# Patient Record
Sex: Male | Born: 1950 | Race: Black or African American | Hispanic: No | Smoking: Never smoker
Health system: Southern US, Community
[De-identification: ages and names within clinical notes are randomized; demographics above are authoritative.]

## PROBLEM LIST (undated history)

## (undated) DIAGNOSIS — R569 Unspecified convulsions: Secondary | ICD-10-CM

## (undated) DIAGNOSIS — E785 Hyperlipidemia, unspecified: Secondary | ICD-10-CM

---

## 2020-09-09 DIAGNOSIS — I779 Disorder of arteries and arterioles, unspecified: Secondary | ICD-10-CM

## 2020-09-09 DIAGNOSIS — I429 Cardiomyopathy, unspecified: Secondary | ICD-10-CM

## 2020-09-09 DIAGNOSIS — I639 Cerebral infarction, unspecified: Secondary | ICD-10-CM

## 2020-09-09 HISTORY — DX: Cardiomyopathy, unspecified: I42.9

## 2020-09-09 HISTORY — DX: Cerebral infarction, unspecified: I63.9

## 2020-09-09 HISTORY — DX: Disorder of arteries and arterioles, unspecified: I77.9

## 2020-10-18 HISTORY — PX: CAROTID ENDARTERECTOMY: SUR193

## 2020-11-25 ENCOUNTER — Emergency Department (HOSPITAL_COMMUNITY): Payer: Medicare Other

## 2020-11-25 ENCOUNTER — Emergency Department (HOSPITAL_COMMUNITY)
Admission: EM | Admit: 2020-11-25 | Discharge: 2020-11-25 | Disposition: A | Discharge status: Home / Self Care | Payer: Medicare Other | Attending: Emergency Medicine | Admitting: Emergency Medicine

## 2020-11-25 ENCOUNTER — Other Ambulatory Visit: Payer: Self-pay

## 2020-11-25 ENCOUNTER — Encounter (HOSPITAL_COMMUNITY): Payer: Self-pay | Admitting: Emergency Medicine

## 2020-11-25 DIAGNOSIS — I2510 Atherosclerotic heart disease of native coronary artery without angina pectoris: Secondary | ICD-10-CM | POA: Insufficient documentation

## 2020-11-25 DIAGNOSIS — R410 Disorientation, unspecified: Secondary | ICD-10-CM | POA: Diagnosis not present

## 2020-11-25 DIAGNOSIS — I700 Atherosclerosis of aorta: Secondary | ICD-10-CM | POA: Diagnosis not present

## 2020-11-25 DIAGNOSIS — N3000 Acute cystitis without hematuria: Secondary | ICD-10-CM | POA: Diagnosis not present

## 2020-11-25 DIAGNOSIS — I6782 Cerebral ischemia: Secondary | ICD-10-CM | POA: Diagnosis not present

## 2020-11-25 DIAGNOSIS — Z20822 Contact with and (suspected) exposure to covid-19: Secondary | ICD-10-CM | POA: Insufficient documentation

## 2020-11-25 DIAGNOSIS — R82998 Other abnormal findings in urine: Secondary | ICD-10-CM | POA: Diagnosis present

## 2020-11-25 LAB — BASIC METABOLIC PANEL
Anion gap: 12 (ref 5–15)
BUN: 10 mg/dL (ref 8–23)
CO2: 21 mmol/L — ABNORMAL LOW (ref 22–32)
Calcium: 8.6 mg/dL — ABNORMAL LOW (ref 8.9–10.3)
Chloride: 96 mmol/L — ABNORMAL LOW (ref 98–111)
Creatinine, Ser: 0.83 mg/dL (ref 0.61–1.24)
GFR, Estimated: >60 mL/min (ref >60)
Glucose, Bld: 90 mg/dL (ref 70–99)
Potassium: 3.4 mmol/L — ABNORMAL LOW (ref 3.5–5.1)
Sodium: 129 mmol/L — ABNORMAL LOW (ref 135–145)

## 2020-11-25 LAB — URINALYSIS, ROUTINE W REFLEX MICROSCOPIC
Bilirubin Urine: NEGATIVE
Glucose, UA: NEGATIVE mg/dL
Hgb urine dipstick: NEGATIVE
Ketones, ur: NEGATIVE mg/dL
Nitrite: NEGATIVE
Protein, ur: 100 mg/dL — AB
Specific Gravity, Urine: 1.014 (ref 1.005–1.030)
WBC, UA: >50 WBC/hpf — ABNORMAL HIGH (ref 0–5)
pH: 6 (ref 5.0–8.0)

## 2020-11-25 LAB — HEPATIC FUNCTION PANEL
ALT: 12 U/L (ref 0–44)
AST: 36 U/L (ref 15–41)
Albumin: 2.4 g/dL — ABNORMAL LOW (ref 3.5–5.0)
Alkaline Phosphatase: 111 U/L (ref 38–126)
Bilirubin, Direct: 0.3 mg/dL — ABNORMAL HIGH (ref 0.0–0.2)
Indirect Bilirubin: 0.8 mg/dL (ref 0.3–0.9)
Total Bilirubin: 1.1 mg/dL (ref 0.3–1.2)
Total Protein: 7.1 g/dL (ref 6.5–8.1)

## 2020-11-25 LAB — CBC
HCT: 26.1 % — ABNORMAL LOW (ref 39.0–52.0)
Hemoglobin: 8.2 g/dL — ABNORMAL LOW (ref 13.0–17.0)
MCH: 29.5 pg (ref 26.0–34.0)
MCHC: 31.4 g/dL (ref 30.0–36.0)
MCV: 93.9 fL (ref 80.0–100.0)
Platelets: 253 10*3/uL (ref 150–400)
RBC: 2.78 MIL/uL — ABNORMAL LOW (ref 4.22–5.81)
RDW: 20.5 % — ABNORMAL HIGH (ref 11.5–15.5)
WBC: 14 10*3/uL — ABNORMAL HIGH (ref 4.0–10.5)
nRBC: 0.1 % (ref 0.0–0.2)

## 2020-11-25 LAB — RESP PANEL BY RT-PCR (FLU A&B, COVID) ARPGX2
Influenza A by PCR: NEGATIVE
Influenza B by PCR: NEGATIVE
SARS Coronavirus 2 by RT PCR: NEGATIVE

## 2020-11-25 LAB — CBG MONITORING, ED: Glucose-Capillary: 75 mg/dL (ref 70–99)

## 2020-11-25 LAB — LIPASE, BLOOD: Lipase: 14 U/L (ref 11–51)

## 2020-11-25 IMAGING — DX DG CHEST 1V PORT
1 series · 1 of 1 positions shown · non-contrast
Comparison: None.

CLINICAL DATA: Increasing weakness syncope is

EXAM:
PORTABLE CHEST 1 VIEW

[chest ap]
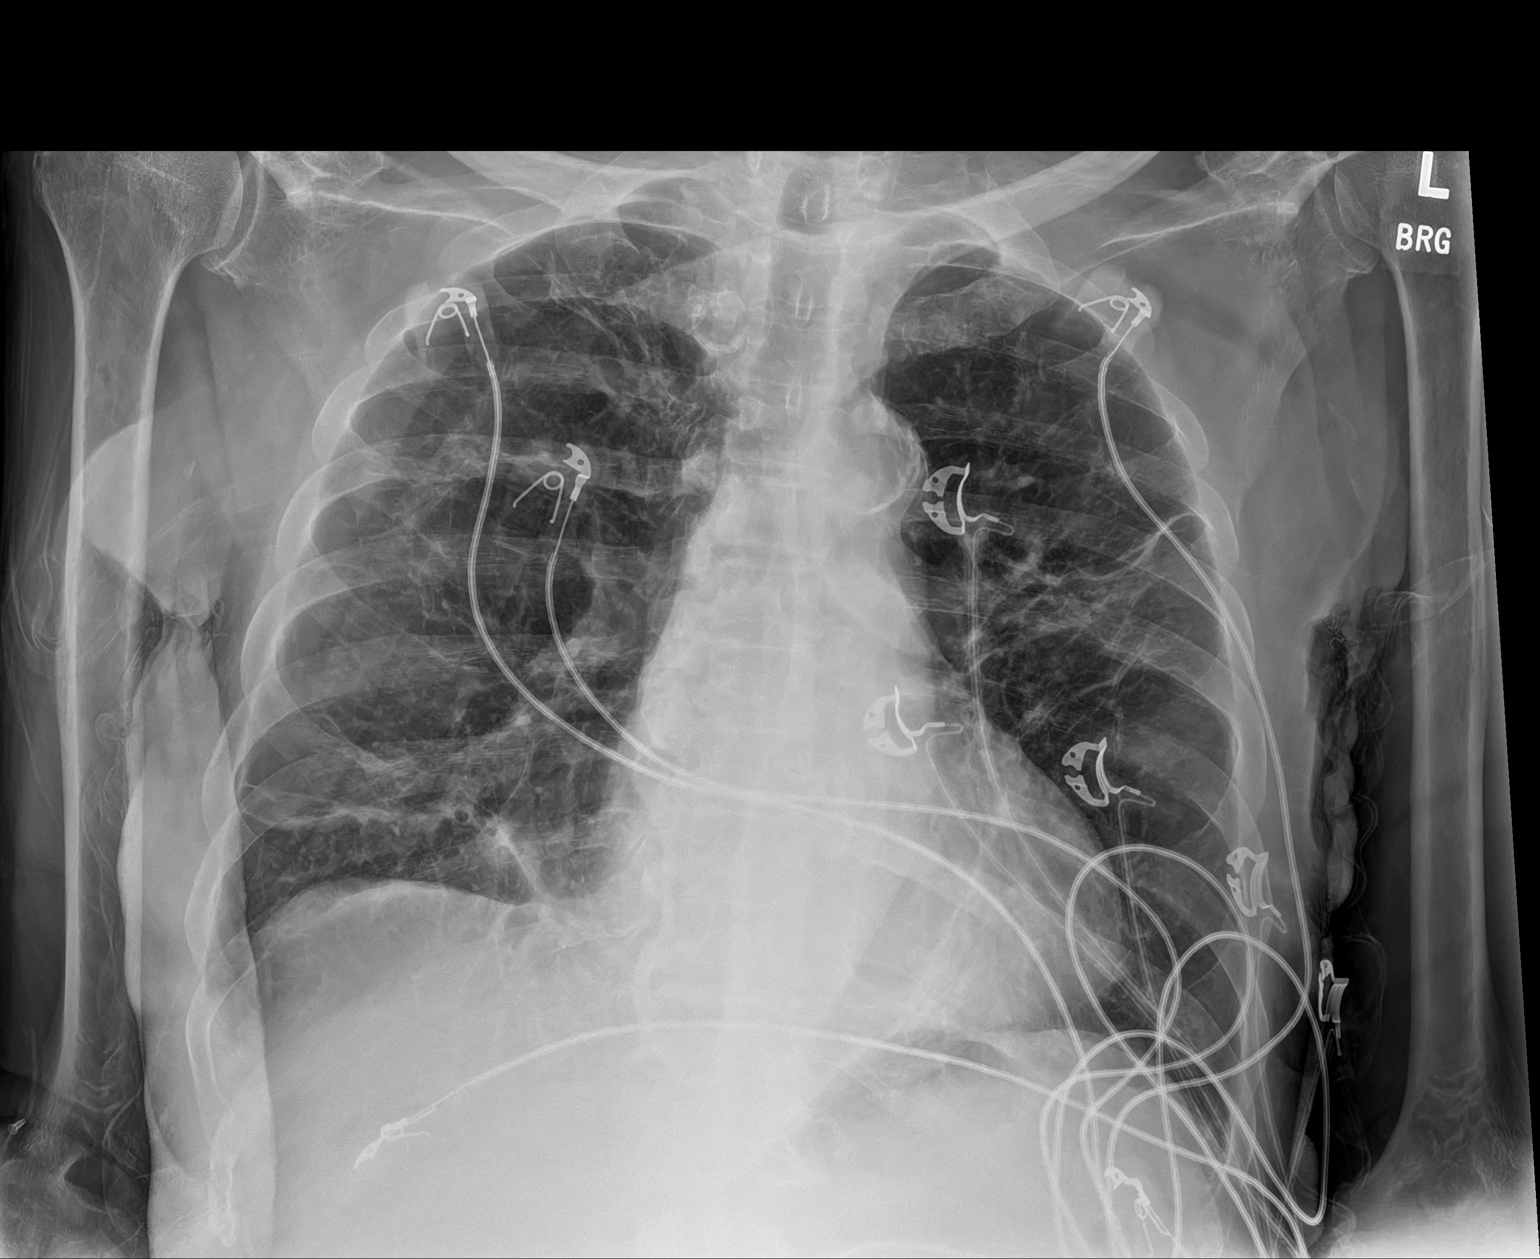

[1 of 1 positions shown; findings below may reference images not displayed]

FINDINGS: Mild right basilar atelectasis/scarring. No focal consolidation. No
pleural effusion or pneumothorax. Heart and mediastinal contours are
unremarkable.

No acute osseous abnormality.
IMPRESSION: No active disease.

## 2020-11-25 IMAGING — CT CT HEAD W/O CM
3 series · 16 of 47 positions shown, 19 images · non-contrast
Comparison: CT head [DATE]

CLINICAL DATA: Mental status change

EXAM:
CT HEAD WITHOUT CONTRAST
TECHNIQUE: Contiguous axial images were obtained from the base of the skull
through the vertex without intravenous contrast.

[Series 2: head w o · axial · 0.44mm/px · z∈[-3,+122]mm · 10 of 31 slices shown, 13 images]
[im 3/31  brain]
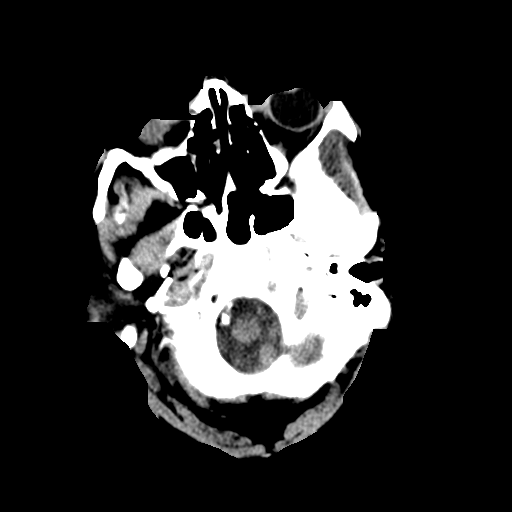
[im 3/31  bone]
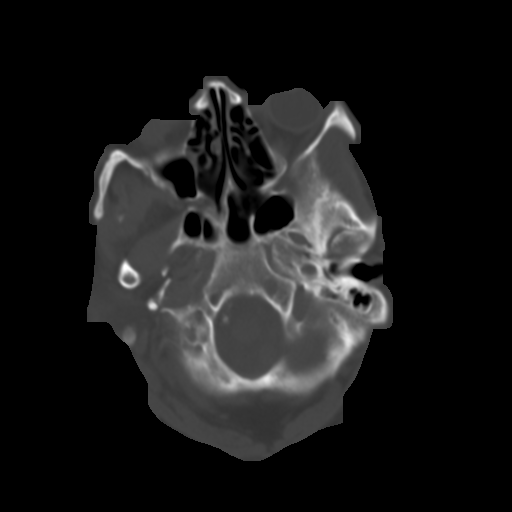
[im 6/31  brain]
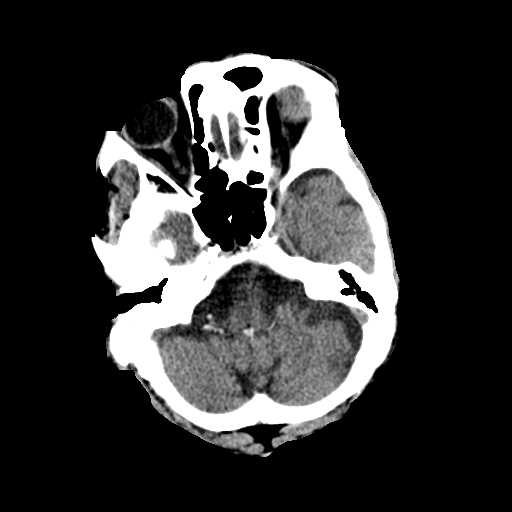
[im 9/31  brain]
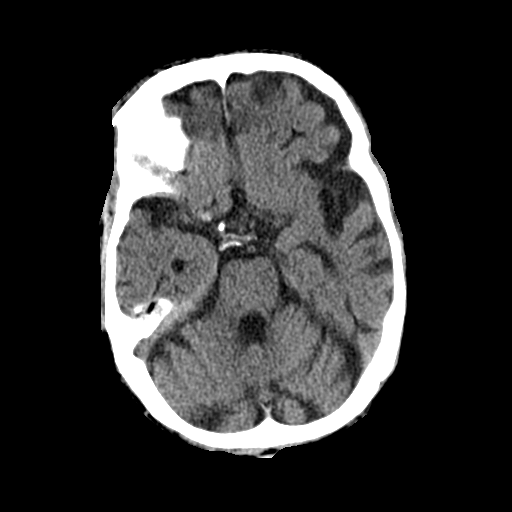
[im 11/31  brain]
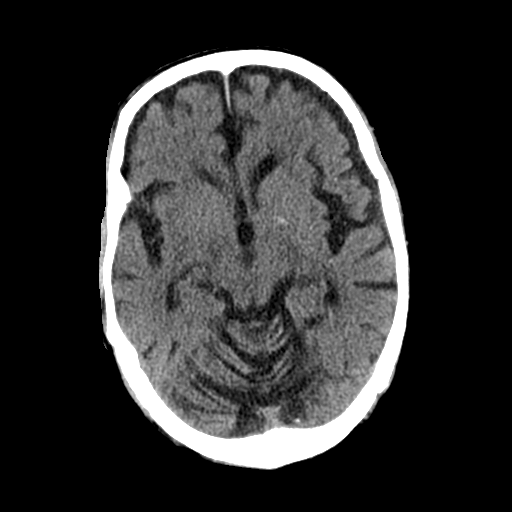
[im 14/31  brain]
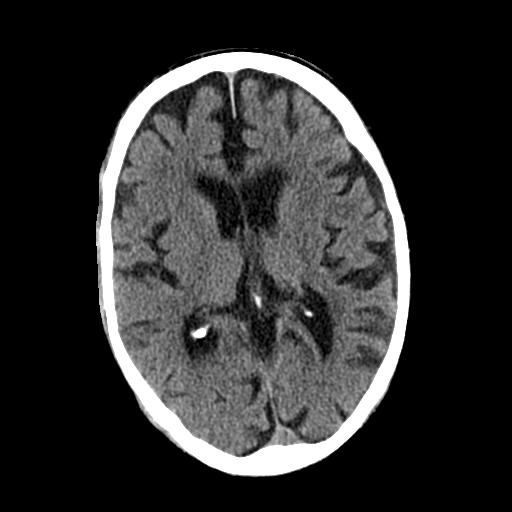
[im 14/31  bone]
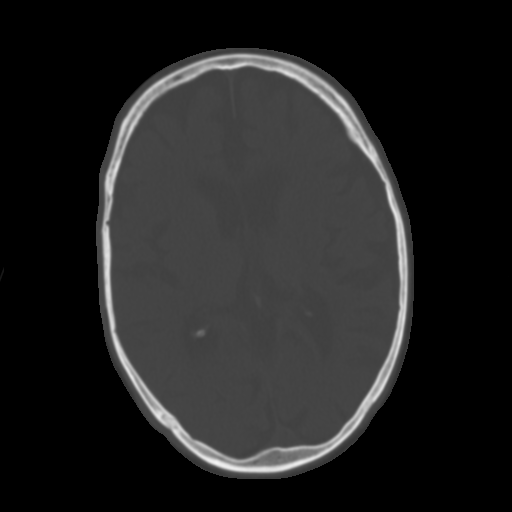
[im 17/31  brain]
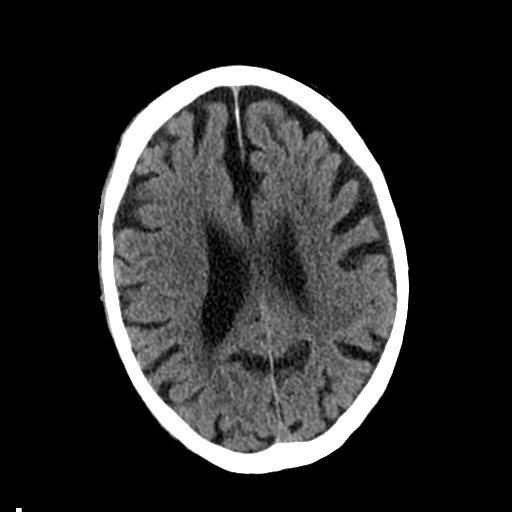
[im 20/31  brain]
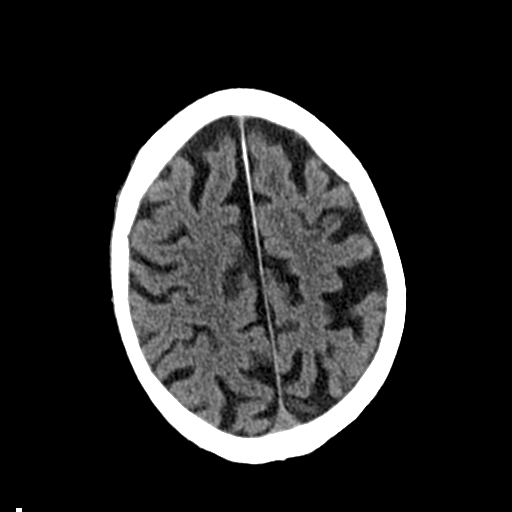
[im 23/31  brain]
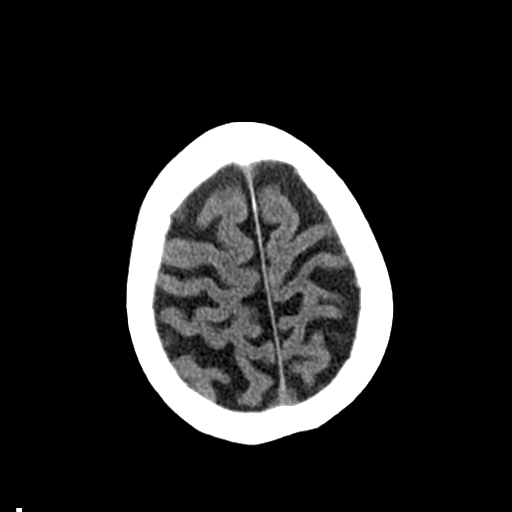
[im 25/31  brain]
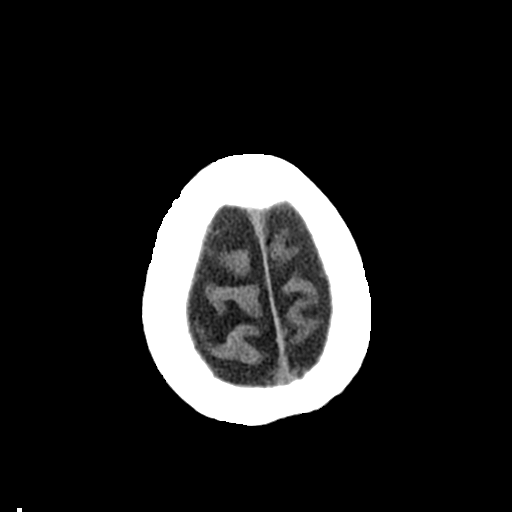
[im 25/31  bone]
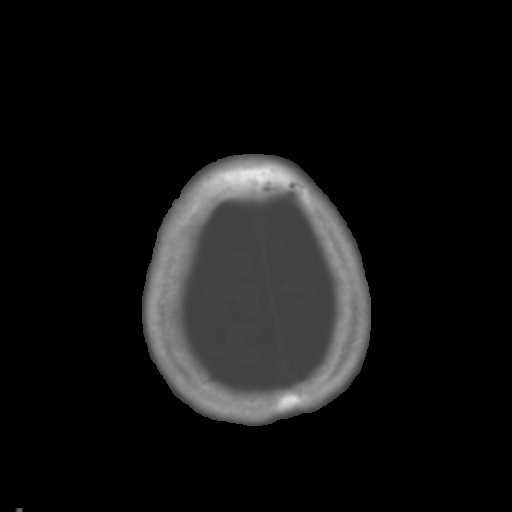
[im 28/31  brain]
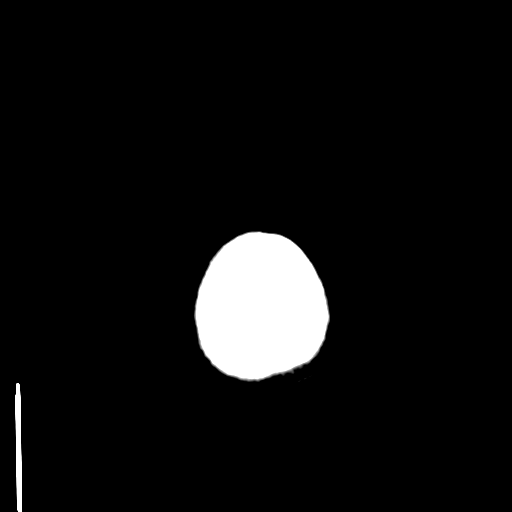

[Series 4: coronal soft · coronal · 0.33mm/px · 3 of 69 slices shown]
[im 23/69  brain]
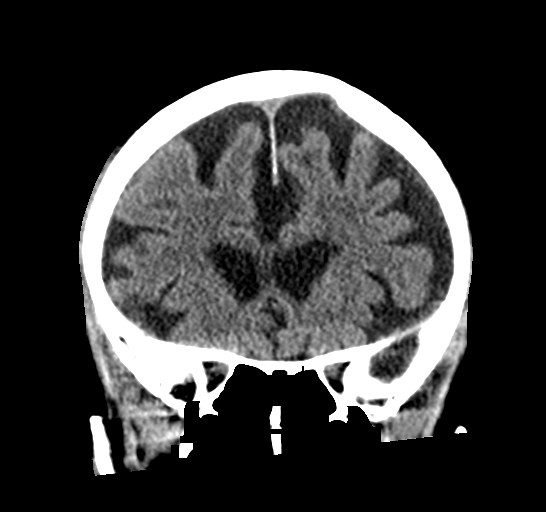
[im 31/69  brain]
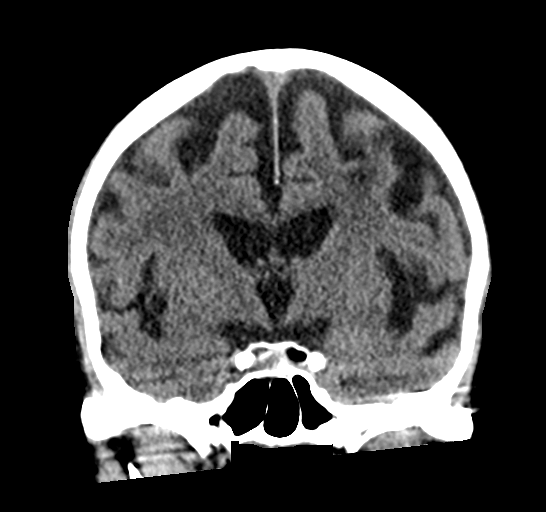
[im 38/69  brain]
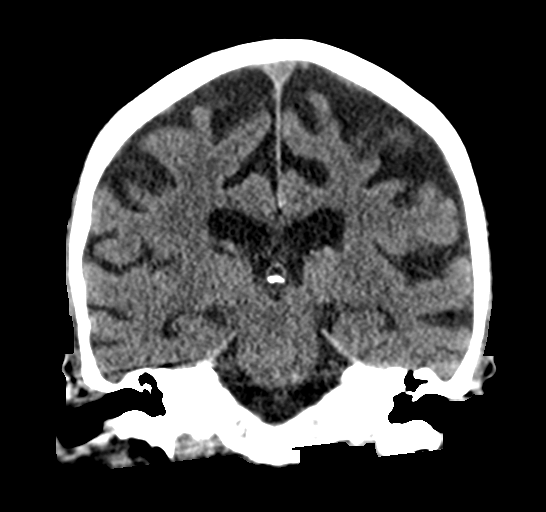

[Series 5: sagittal soft · sagittal · 0.33mm/px · 3 of 56 slices shown]
[im 19/56  brain]
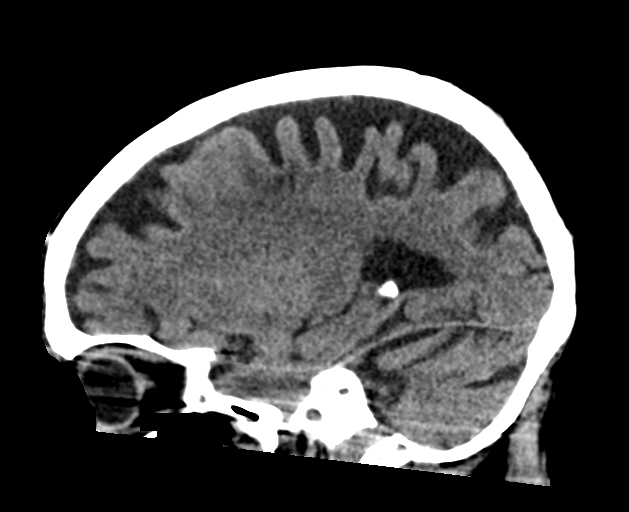
[im 28/56  brain]
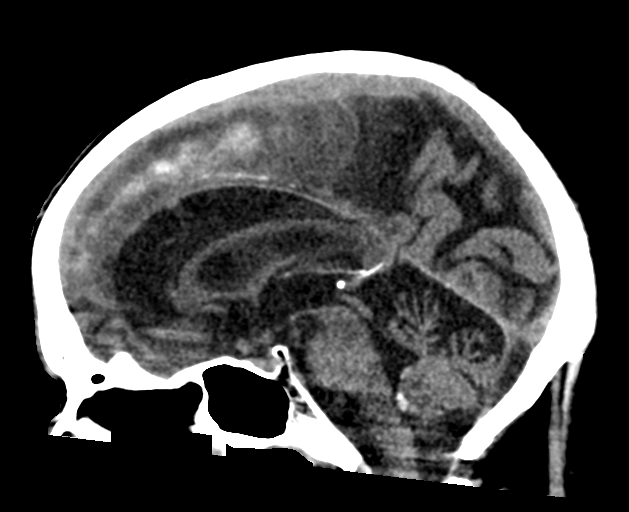
[im 37/56  brain]
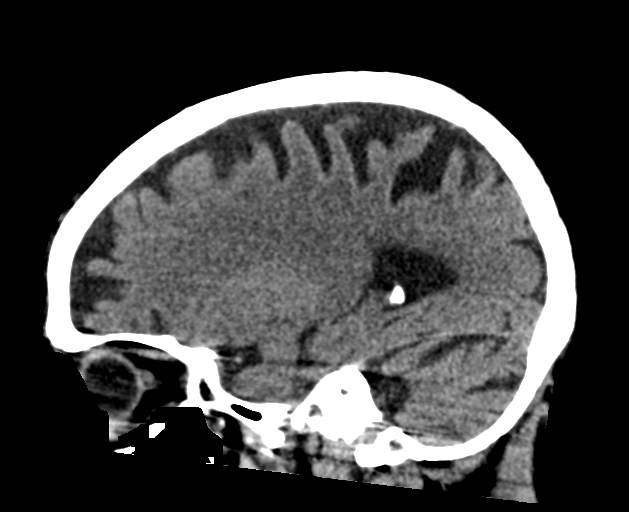

[16 of 47 positions shown; findings below may reference images not displayed]

FINDINGS: Brain: Moderate atrophy. Negative for hydrocephalus. Mild white
matter hypodensity especially in the left frontal lobe is unchanged.

Negative for acute infarct, hemorrhage, mass.

Vascular: Negative for hyperdense vessel. Atherosclerotic
calcification in the carotid and vertebral arteries bilaterally.

Skull: Negative

Sinuses/Orbits: Mild mucosal edema paranasal sinuses. Negative orbit

Other: None
IMPRESSION: Moderate atrophy. Mild chronic microvascular ischemic change. No
acute abnormality no change from the prior study.

## 2020-11-25 IMAGING — CT CT CHEST W/O CM
2 of 5 series · 14 of 36 positions shown, 17 images · non-contrast
Comparison: Chest radiograph [DATE]

CLINICAL DATA: Confusion.  Abnormal chest radiograph

EXAM:
CT CHEST WITHOUT CONTRAST
TECHNIQUE: Multidetector CT imaging of the chest was performed following the
standard protocol without IV contrast.

[Series 3: thins · axial · 0.62mm/px · z∈[-396,-144]mm · 11 of 432 slices shown, 14 images]
[im 36/432  mediastinal]
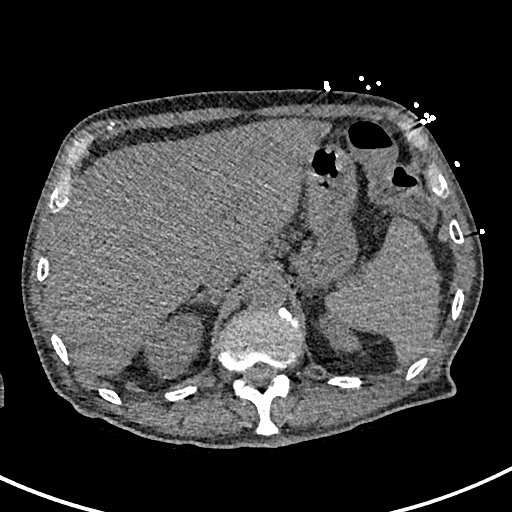
[im 36/432  lung]
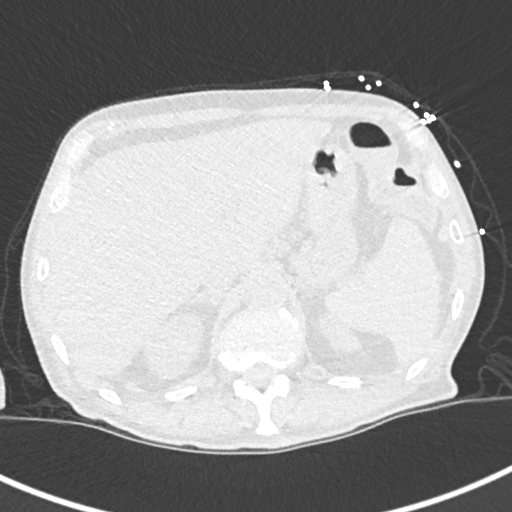
[im 72/432  lung]
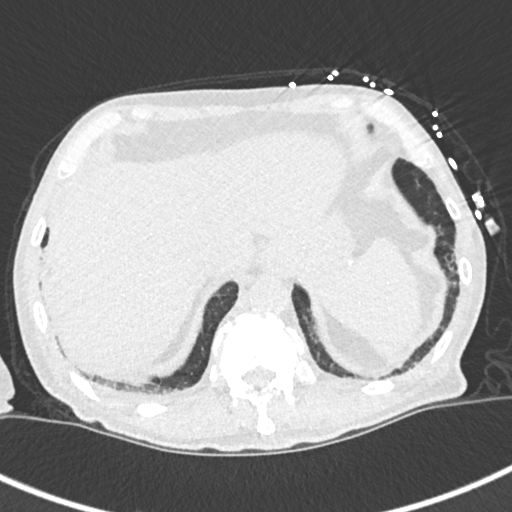
[im 108/432  lung]
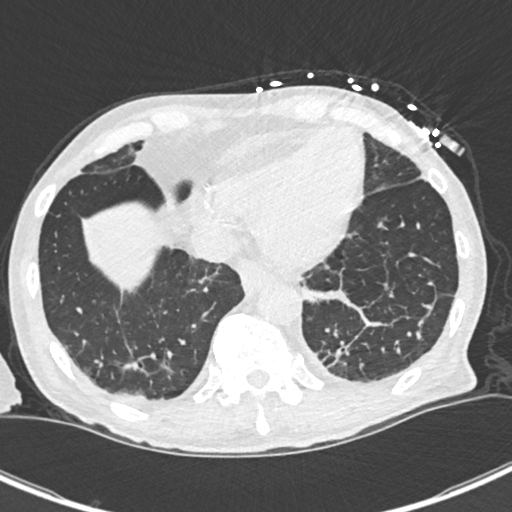
[im 144/432  lung]
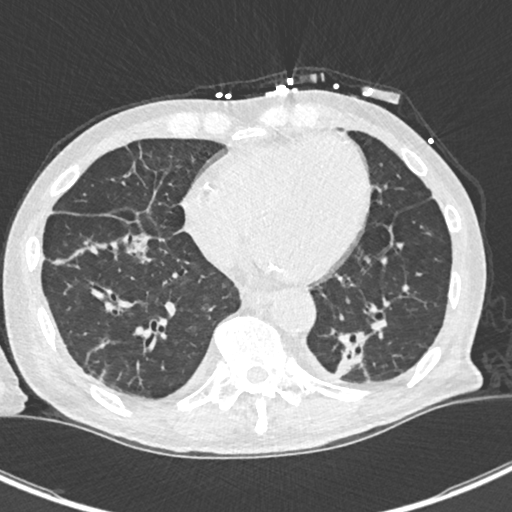
[im 180/432  mediastinal]
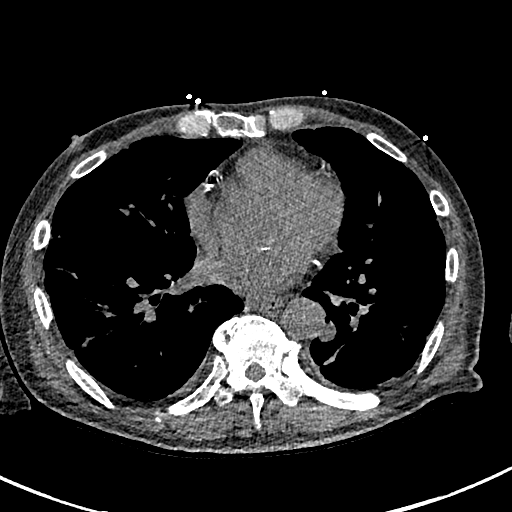
[im 180/432  lung]
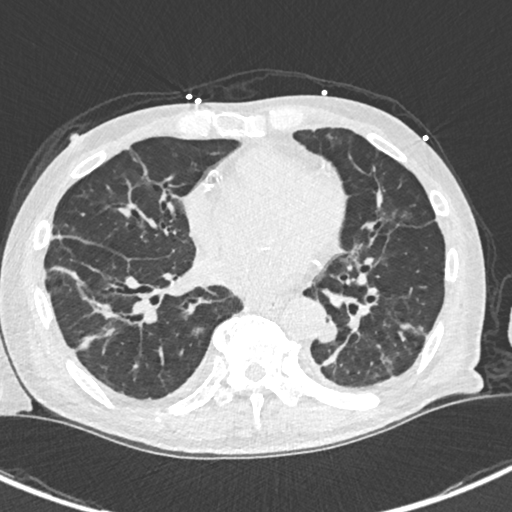
[im 216/432  lung]
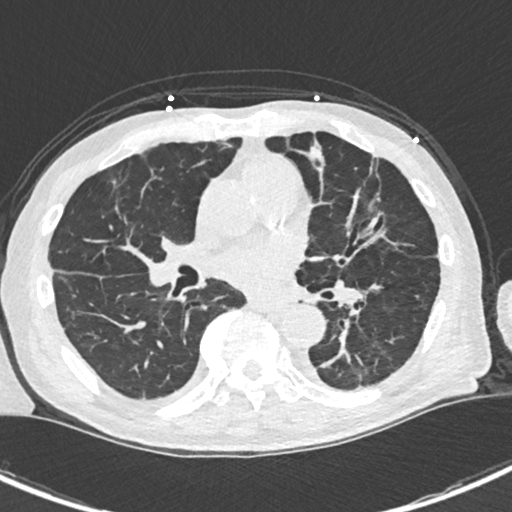
[im 252/432  lung]
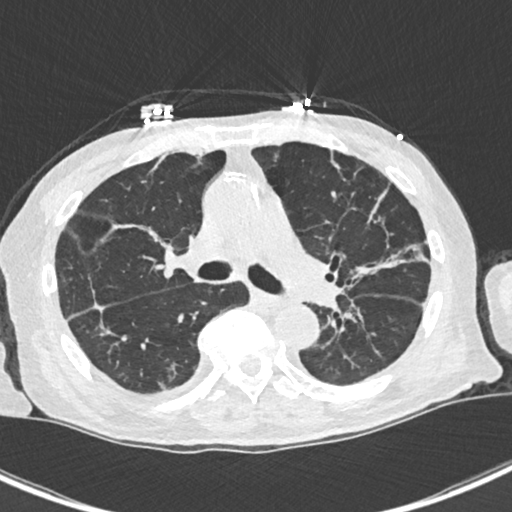
[im 288/432  lung]
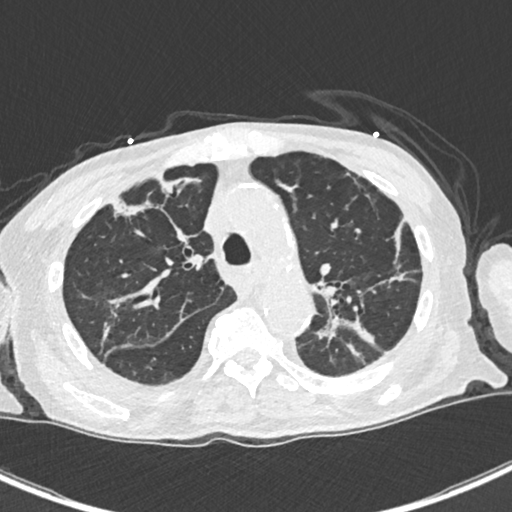
[im 324/432  mediastinal]
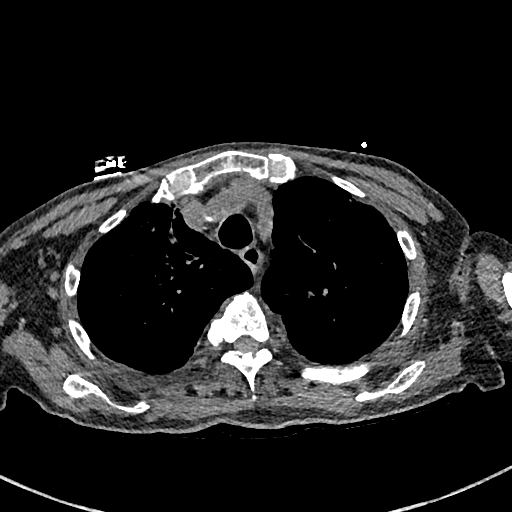
[im 324/432  lung]
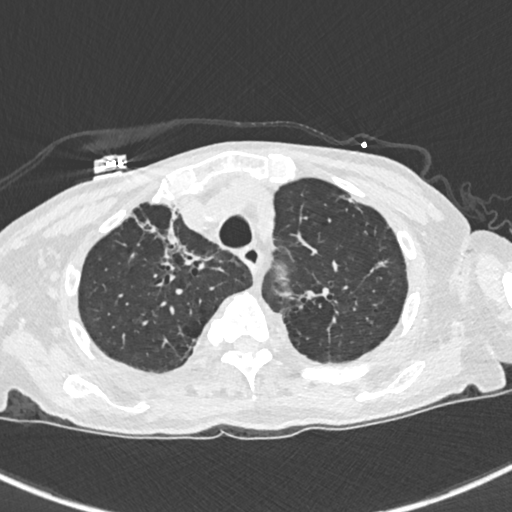
[im 360/432  lung]
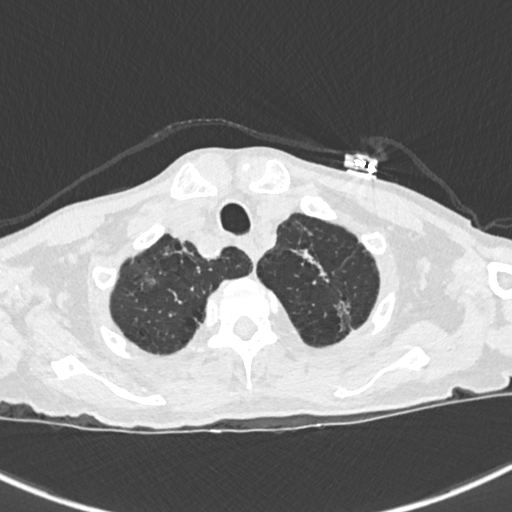
[im 396/432  lung]
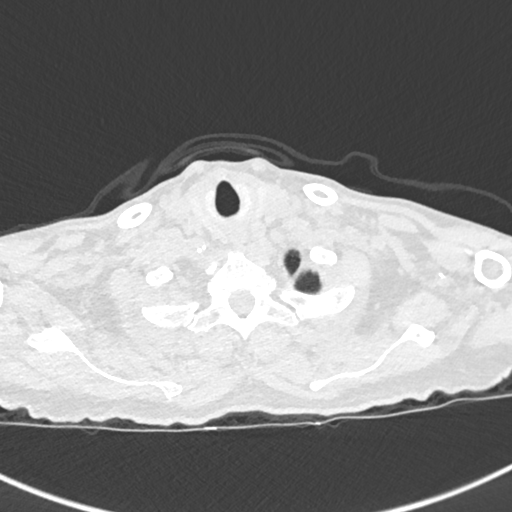

[Series 5: coronal · coronal · 0.64mm/px · 3 of 122 slices shown]
[im 25/122  lung]
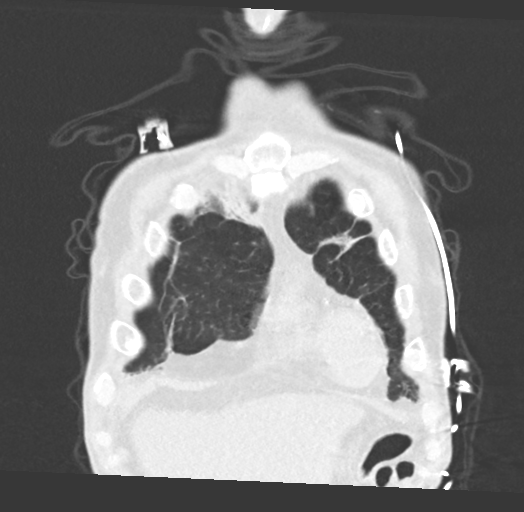
[im 49/122  lung]
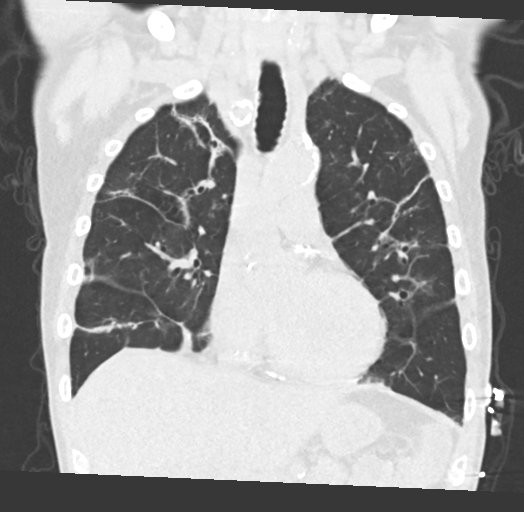
[im 73/122  lung]
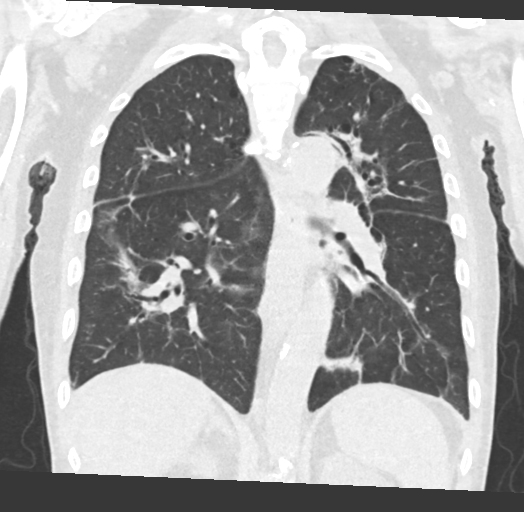

[14 of 36 positions shown; findings below may reference images not displayed]

FINDINGS: Cardiovascular: There is no thoracic aortic aneurysm. There is
aortic atherosclerosis. There are multiple foci of calcification in
visualized great vessels. There are multiple foci of coronary artery
calcification. There is no pericardial effusion or pericardial
thickening.

Mediastinum/Nodes: Thyroid appears unremarkable. There are scattered
subcentimeter mediastinal lymph nodes without adenopathy by size
criteria. There is a small hiatal hernia.

Lungs/Pleura: There are multiple areas of scarring throughout the
lungs. There are foci of bronchiectatic change bilaterally, most
severe in the lower lobe regions but also moderate in the right
upper lobe. There are areas of associated atelectatic change as
well. There is no frank edema or consolidation. No pleural effusions
are evident.

Upper Abdomen: There is upper abdominal aortic atherosclerosis.
Visualized upper abdominal structures otherwise appear unremarkable.

Musculoskeletal: There are foci of degenerative change in the
thoracic spine with diffuse idiopathic skeletal hyperostosis. No
blastic or lytic bone lesions. No chest wall lesions evident.
IMPRESSION: 1. Areas of bronchiectatic change, most severe in the lower lobe
regions but also moderate in the right upper lobe and to a lesser
degree in the left upper lobe.

2. Multiple areas of scarring and patchy atelectasis. No frank edema
or consolidation.

3.  No evident adenopathy.

4. Aortic atherosclerosis. Foci of great vessel and coronary artery
calcification.

Comment: Given absence of prior radiographic examinations to
compare, a follow-up radiographic examination in approximately 10-12
weeks to assess for stability is felt to be warranted and advisable.

Aortic Atherosclerosis ([KC]-[KC]).

## 2020-11-25 MED ORDER — SODIUM CHLORIDE 0.9 % IV BOLUS
500.0000 mL | Freq: Once | INTRAVENOUS | Status: AC
  Administered 2020-11-25: 12:00:00 500 mL via INTRAVENOUS

## 2020-11-25 MED ORDER — CEPHALEXIN 500 MG PO CAPS: 500.0000 mg | ORAL_CAPSULE | Freq: Four times a day (QID) | ORAL | 0 refills | Status: AC

## 2020-11-25 MED ORDER — SODIUM CHLORIDE 0.9 % IV SOLN
1.0000 g | Freq: Once | INTRAVENOUS | Status: AC
  Administered 2020-11-25: 16:00:00 1 g via INTRAVENOUS
  Filled 2020-11-25: qty 10

## 2020-11-25 MED ORDER — SODIUM CHLORIDE 0.9 % IV SOLN: INTRAVENOUS | Status: DC

## 2020-11-25 NOTE — Discharge Instructions (Signed)
Urine culture sent.  Urinalysis consistent with urinary tract infection.  IV Rocephin given here.  Will need to continue Keflex at home.  Prescription provided.  Can start that tomorrow.  Would expect improvement over the next couple days.  If he gets worse or does not improve needs to get seen again.

## 2020-11-25 NOTE — ED Triage Notes (Signed)
Per son who is at the bedside, pt has been increasingly weak and confused. Son stating the patient has not been eating and drinking normally x 2 days.

## 2020-11-25 NOTE — ED Provider Notes (Signed)
[facility] EMERGENCY DEPARTMENT Provider Note   CSN: [id number] Arrival date & time: 11/25/20  1003     History Chief Complaint  Patient presents with  . Weakness    Stanley Scott is a 69 y.o. male.  Patient brought in by family member.  Patient spends time either in bed or in chair.  Does not spend much time walking around.  But son takes care of him and lives with him notes of been increasingly weak been some confusion and has not been wanting to eat or drink much for the past 2 days.  The changes been over the past 2 days.  No nausea vomiting or diarrhea no upper respiratory infections.  No falls or injuries.  Patient smells of strong urine        History reviewed. No pertinent past medical history.  There are no problems to display for this patient.   History reviewed. No pertinent surgical history.     No family history on file.  Social History   Tobacco Use  . Smoking status: Never Smoker  . Smokeless tobacco: Never Used  Substance Use Topics  . Alcohol use: Not Currently  . Drug use: Never    Home Medications Prior to Admission medications   Medication Sig Start Date End Date Taking? Authorizing Provider  cephALEXin (KEFLEX) 500 MG capsule Take 1 capsule (500 mg total) by mouth 4 (four) times daily. 11/25/20   Vanetta Mulders, MD    Allergies    Patient has no allergy information on record.  Review of Systems   Review of Systems  Constitutional: Positive for appetite change. Negative for chills and fever.  HENT: Negative for congestion, rhinorrhea and sore throat.   Eyes: Negative for visual disturbance.  Respiratory: Negative for cough and shortness of breath.   Cardiovascular: Negative for chest pain and leg swelling.  Gastrointestinal: Negative for abdominal pain, diarrhea, nausea and vomiting.  Genitourinary: Negative for dysuria.  Musculoskeletal: Negative for back pain and neck pain.  Skin: Negative for rash.  Neurological: Negative  for dizziness, light-headedness and headaches.  Hematological: Does not bruise/bleed easily.  Psychiatric/Behavioral: Positive for confusion.    Physical Exam Updated Vital Signs BP 136/64   Pulse 100   Temp 98.5 F (36.9 C) (Oral)   Resp (!) 21   Ht 1.651 m (5\' 5" )   Wt 52.8 kg   SpO2 98%   BMI 19.35 kg/m   Physical Exam Vitals and nursing note reviewed.  Constitutional:      Appearance: Normal appearance. He is well-developed and well-nourished.  HENT:     Head: Normocephalic and atraumatic.  Eyes:     Extraocular Movements: Extraocular movements intact.     Conjunctiva/sclera: Conjunctivae normal.     Pupils: Pupils are equal, round, and reactive to light.  Cardiovascular:     Rate and Rhythm: Normal rate and regular rhythm.     Heart sounds: No murmur heard.   Pulmonary:     Effort: Pulmonary effort is normal. No respiratory distress.     Breath sounds: Normal breath sounds.  Abdominal:     Palpations: Abdomen is soft.     Tenderness: There is no abdominal tenderness.  Musculoskeletal:        General: No edema. Normal range of motion.     Cervical back: Normal range of motion and neck supple.  Skin:    General: Skin is warm and dry.     Capillary Refill: Capillary refill takes less than 2 seconds.  Neurological:     General: No focal deficit present.     Mental Status: He is alert.     Cranial Nerves: No cranial nerve deficit.     Sensory: No sensory deficit.     Motor: No weakness.     Comments: Patient alert and will answer questions.  Psychiatric:        Mood and Affect: Mood and affect normal.     ED Results / Procedures / Treatments   Labs (all labs ordered are listed, but only abnormal results are displayed) Labs Reviewed  BASIC METABOLIC PANEL - Abnormal; Notable for the following components:      Result Value   Sodium 129 (*)    Potassium 3.4 (*)    Chloride 96 (*)    CO2 21 (*)    Calcium 8.6 (*)    All other components within normal  limits  CBC - Abnormal; Notable for the following components:   WBC 14.0 (*)    RBC 2.78 (*)    Hemoglobin 8.2 (*)    HCT 26.1 (*)    RDW 20.5 (*)    All other components within normal limits  URINALYSIS, ROUTINE W REFLEX MICROSCOPIC - Abnormal; Notable for the following components:   APPearance CLOUDY (*)    Protein, ur 100 (*)    Leukocytes,Ua LARGE (*)    WBC, UA >50 (*)    Bacteria, UA FEW (*)    All other components within normal limits  HEPATIC FUNCTION PANEL - Abnormal; Notable for the following components:   Albumin 2.4 (*)    Bilirubin, Direct 0.3 (*)    All other components within normal limits  RESP PANEL BY RT-PCR (FLU A&B, COVID) [id number]  URINE CULTURE  LIPASE, BLOOD  CBG MONITORING, ED    EKG EKG Interpretation  Date/Time:  Friday November 25 2020 10:37:44 EST Ventricular Rate:  101 PR Interval:    QRS Duration: 152 QT Interval:  409 QTC Calculation: 531 R Axis:   87 Text Interpretation: Sinus tachycardia Ventricular premature complex Right bundle branch block Probable inferior infarct, old No previous ECGs available Confirmed by Vanetta Mulders [phone]) on 11/25/2020 10:53:13 AM   Radiology CT Head Wo Contrast  Result Date: 11/25/2020 CLINICAL DATA:  Mental status change EXAM: CT HEAD WITHOUT CONTRAST TECHNIQUE: Contiguous axial images were obtained from the base of the skull through the vertex without intravenous contrast. COMPARISON:  CT head 09/26/2020 FINDINGS: Brain: Moderate atrophy. Negative for hydrocephalus. Mild white matter hypodensity especially in the left frontal lobe is unchanged. Negative for acute infarct, hemorrhage, mass. Vascular: Negative for hyperdense vessel. Atherosclerotic calcification in the carotid and vertebral arteries bilaterally. Skull: Negative Sinuses/Orbits: Mild mucosal edema paranasal sinuses. Negative orbit Other: None IMPRESSION: Moderate atrophy. Mild chronic microvascular ischemic change. No acute abnormality no change  from the prior study. Electronically Signed   By: Marlan Palau M.D.   On: 11/25/2020 14:50   CT Chest Wo Contrast  Result Date: 11/25/2020 CLINICAL DATA:  Confusion.  Abnormal chest radiograph EXAM: CT CHEST WITHOUT CONTRAST TECHNIQUE: Multidetector CT imaging of the chest was performed following the standard protocol without IV contrast. COMPARISON:  Chest radiograph November 25, 2020 FINDINGS: Cardiovascular: There is no thoracic aortic aneurysm. There is aortic atherosclerosis. There are multiple foci of calcification in visualized great vessels. There are multiple foci of coronary artery calcification. There is no pericardial effusion or pericardial thickening. Mediastinum/Nodes: Thyroid appears unremarkable. There are scattered subcentimeter mediastinal lymph nodes without adenopathy by size  criteria. There is a small hiatal hernia. Lungs/Pleura: There are multiple areas of scarring throughout the lungs. There are foci of bronchiectatic change bilaterally, most severe in the lower lobe regions but also moderate in the right upper lobe. There are areas of associated atelectatic change as well. There is no frank edema or consolidation. No pleural effusions are evident. Upper Abdomen: There is upper abdominal aortic atherosclerosis. Visualized upper abdominal structures otherwise appear unremarkable. Musculoskeletal: There are foci of degenerative change in the thoracic spine with diffuse idiopathic skeletal hyperostosis. No blastic or lytic bone lesions. No chest wall lesions evident. IMPRESSION: 1. Areas of bronchiectatic change, most severe in the lower lobe regions but also moderate in the right upper lobe and to a lesser degree in the left upper lobe. 2. Multiple areas of scarring and patchy atelectasis. No frank edema or consolidation. 3.  No evident adenopathy. 4. Aortic atherosclerosis. Foci of great vessel and coronary artery calcification. Comment: Given absence of prior radiographic examinations  to compare, a follow-up radiographic examination in approximately 10-12 weeks to assess for stability is felt to be warranted and advisable. Aortic Atherosclerosis (ICD10-I70.0). Electronically Signed   By: Bretta BangWilliam  Woodruff III M.D.   On: 11/25/2020 15:05   DG Chest Port 1 View  Result Date: 11/25/2020 CLINICAL DATA:  Increasing weakness syncope is EXAM: PORTABLE CHEST 1 VIEW COMPARISON:  None. FINDINGS: Mild right basilar atelectasis/scarring. No focal consolidation. No pleural effusion or pneumothorax. Heart and mediastinal contours are unremarkable. No acute osseous abnormality. IMPRESSION: No active disease. Electronically Signed   By: Elige KoHetal  Patel   On: 11/25/2020 11:59    Procedures Procedures (including critical care time)  Medications Ordered in ED Medications  0.9 %  sodium chloride infusion ( Intravenous New Bag/Given 11/25/20 1141)  cefTRIAXone (ROCEPHIN) 1 g in sodium chloride 0.9 % 100 mL IVPB (1 g Intravenous New Bag/Given 11/25/20 1620)  sodium chloride 0.9 % bolus 500 mL (0 mLs Intravenous Stopped 11/25/20 1239)    ED Course  I have reviewed the triage vital signs and the nursing notes.  Pertinent labs & imaging results that were available during my care of the patient were reviewed by me and considered in my medical decision making (see chart for details).    MDM Rules/Calculators/A&P                          Work-up significant for leukocytosis.  Chest x-ray negative head CT negative for any acute findings.  Analysis shows large leukocytosis consistent with urinary tract infection.  Also goes along with a strong smelling urine.  Patient also given IV fluids here.  He is more alert and more perky.  Ischial sodium was a little down at 129 but I am sure it is improved.  White blood cell count was 14,000.  Patient received 1 g of Rocephin here urine sent for culture.  Feel that patient's presentation was secondary to urinary tract infection.  Patient will be treated with  Keflex for the next 7 days at home.  Family is okay with him going home.  And urine culture sent.  They will bring him back for any new or worse symptoms or if he does not improve over the next couple days.  Hemoglobin significant for 8.2.  But no history of any bleeding.  No hypotension.  Oxygen sats 100%.     Final Clinical Impression(s) / ED Diagnoses Final diagnoses:  Acute cystitis without hematuria    Rx /  DC Orders ED Discharge Orders         Ordered    cephALEXin (KEFLEX) 500 MG capsule  4 times daily        11/25/20 1625           Vanetta Mulders, MD 11/25/20 [phone]

## 2020-11-27 LAB — URINE CULTURE: Culture: >=100000 — AB

## 2020-12-05 ENCOUNTER — Encounter (HOSPITAL_COMMUNITY): Payer: Self-pay | Admitting: Emergency Medicine

## 2020-12-05 ENCOUNTER — Other Ambulatory Visit: Payer: Self-pay

## 2020-12-05 ENCOUNTER — Emergency Department (HOSPITAL_COMMUNITY): Payer: Medicare Other

## 2020-12-05 ENCOUNTER — Inpatient Hospital Stay (HOSPITAL_COMMUNITY)
Admission: EM | Admit: 2020-12-05 | Discharge: 2021-01-10 | DRG: 371 | Disposition: E | Payer: Medicare Other | Attending: Internal Medicine | Admitting: Internal Medicine

## 2020-12-05 DIAGNOSIS — M4802 Spinal stenosis, cervical region: Secondary | ICD-10-CM | POA: Diagnosis not present

## 2020-12-05 DIAGNOSIS — R531 Weakness: Secondary | ICD-10-CM

## 2020-12-05 DIAGNOSIS — K8000 Calculus of gallbladder with acute cholecystitis without obstruction: Secondary | ICD-10-CM | POA: Diagnosis not present

## 2020-12-05 DIAGNOSIS — R627 Adult failure to thrive: Secondary | ICD-10-CM | POA: Diagnosis not present

## 2020-12-05 DIAGNOSIS — Z515 Encounter for palliative care: Secondary | ICD-10-CM

## 2020-12-05 DIAGNOSIS — R636 Underweight: Secondary | ICD-10-CM | POA: Diagnosis present

## 2020-12-05 DIAGNOSIS — R6521 Severe sepsis with septic shock: Secondary | ICD-10-CM | POA: Diagnosis not present

## 2020-12-05 DIAGNOSIS — K219 Gastro-esophageal reflux disease without esophagitis: Secondary | ICD-10-CM | POA: Diagnosis not present

## 2020-12-05 DIAGNOSIS — Y9389 Activity, other specified: Secondary | ICD-10-CM

## 2020-12-05 DIAGNOSIS — I10 Essential (primary) hypertension: Secondary | ICD-10-CM | POA: Diagnosis not present

## 2020-12-05 DIAGNOSIS — R739 Hyperglycemia, unspecified: Secondary | ICD-10-CM

## 2020-12-05 DIAGNOSIS — R001 Bradycardia, unspecified: Secondary | ICD-10-CM | POA: Diagnosis not present

## 2020-12-05 DIAGNOSIS — G9341 Metabolic encephalopathy: Secondary | ICD-10-CM | POA: Diagnosis not present

## 2020-12-05 DIAGNOSIS — R64 Cachexia: Secondary | ICD-10-CM | POA: Diagnosis present

## 2020-12-05 DIAGNOSIS — I69392 Facial weakness following cerebral infarction: Secondary | ICD-10-CM

## 2020-12-05 DIAGNOSIS — R200 Anesthesia of skin: Secondary | ICD-10-CM

## 2020-12-05 DIAGNOSIS — I455 Other specified heart block: Secondary | ICD-10-CM | POA: Diagnosis not present

## 2020-12-05 DIAGNOSIS — L8990 Pressure ulcer of unspecified site, unspecified stage: Secondary | ICD-10-CM | POA: Diagnosis present

## 2020-12-05 DIAGNOSIS — Z20822 Contact with and (suspected) exposure to covid-19: Secondary | ICD-10-CM | POA: Diagnosis present

## 2020-12-05 DIAGNOSIS — Z66 Do not resuscitate: Secondary | ICD-10-CM | POA: Diagnosis not present

## 2020-12-05 DIAGNOSIS — I429 Cardiomyopathy, unspecified: Secondary | ICD-10-CM | POA: Diagnosis present

## 2020-12-05 DIAGNOSIS — Z751 Person awaiting admission to adequate facility elsewhere: Secondary | ICD-10-CM

## 2020-12-05 DIAGNOSIS — I69351 Hemiplegia and hemiparesis following cerebral infarction affecting right dominant side: Secondary | ICD-10-CM | POA: Diagnosis not present

## 2020-12-05 DIAGNOSIS — R680 Hypothermia, not associated with low environmental temperature: Secondary | ICD-10-CM | POA: Diagnosis present

## 2020-12-05 DIAGNOSIS — Y92013 Bedroom of single-family (private) house as the place of occurrence of the external cause: Secondary | ICD-10-CM | POA: Diagnosis not present

## 2020-12-05 DIAGNOSIS — D649 Anemia, unspecified: Secondary | ICD-10-CM | POA: Diagnosis not present

## 2020-12-05 DIAGNOSIS — E8809 Other disorders of plasma-protein metabolism, not elsewhere classified: Secondary | ICD-10-CM | POA: Diagnosis present

## 2020-12-05 DIAGNOSIS — E43 Unspecified severe protein-calorie malnutrition: Secondary | ICD-10-CM | POA: Diagnosis not present

## 2020-12-05 DIAGNOSIS — A0472 Enterocolitis due to Clostridium difficile, not specified as recurrent: Secondary | ICD-10-CM | POA: Diagnosis present

## 2020-12-05 DIAGNOSIS — R6510 Systemic inflammatory response syndrome (SIRS) of non-infectious origin without acute organ dysfunction: Secondary | ICD-10-CM

## 2020-12-05 DIAGNOSIS — G9519 Other vascular myelopathies: Secondary | ICD-10-CM | POA: Diagnosis present

## 2020-12-05 DIAGNOSIS — L89152 Pressure ulcer of sacral region, stage 2: Secondary | ICD-10-CM | POA: Diagnosis not present

## 2020-12-05 DIAGNOSIS — K31819 Angiodysplasia of stomach and duodenum without bleeding: Secondary | ICD-10-CM | POA: Diagnosis not present

## 2020-12-05 DIAGNOSIS — D696 Thrombocytopenia, unspecified: Secondary | ICD-10-CM | POA: Diagnosis not present

## 2020-12-05 DIAGNOSIS — A4151 Sepsis due to Escherichia coli [E. coli]: Secondary | ICD-10-CM | POA: Diagnosis not present

## 2020-12-05 DIAGNOSIS — T380X5A Adverse effect of glucocorticoids and synthetic analogues, initial encounter: Secondary | ICD-10-CM | POA: Diagnosis not present

## 2020-12-05 DIAGNOSIS — I4581 Long QT syndrome: Secondary | ICD-10-CM | POA: Diagnosis present

## 2020-12-05 DIAGNOSIS — I959 Hypotension, unspecified: Secondary | ICD-10-CM | POA: Diagnosis present

## 2020-12-05 DIAGNOSIS — Z79899 Other long term (current) drug therapy: Secondary | ICD-10-CM

## 2020-12-05 DIAGNOSIS — D638 Anemia in other chronic diseases classified elsewhere: Secondary | ICD-10-CM | POA: Diagnosis present

## 2020-12-05 DIAGNOSIS — E860 Dehydration: Secondary | ICD-10-CM | POA: Diagnosis present

## 2020-12-05 DIAGNOSIS — G40509 Epileptic seizures related to external causes, not intractable, without status epilepticus: Secondary | ICD-10-CM | POA: Diagnosis present

## 2020-12-05 DIAGNOSIS — T68XXXA Hypothermia, initial encounter: Secondary | ICD-10-CM | POA: Diagnosis present

## 2020-12-05 DIAGNOSIS — E785 Hyperlipidemia, unspecified: Secondary | ICD-10-CM | POA: Diagnosis present

## 2020-12-05 DIAGNOSIS — R1312 Dysphagia, oropharyngeal phase: Secondary | ICD-10-CM | POA: Diagnosis present

## 2020-12-05 DIAGNOSIS — T17908A Unspecified foreign body in respiratory tract, part unspecified causing other injury, initial encounter: Secondary | ICD-10-CM

## 2020-12-05 DIAGNOSIS — R54 Age-related physical debility: Secondary | ICD-10-CM | POA: Diagnosis present

## 2020-12-05 DIAGNOSIS — Z87440 Personal history of urinary (tract) infections: Secondary | ICD-10-CM

## 2020-12-05 DIAGNOSIS — E7800 Pure hypercholesterolemia, unspecified: Secondary | ICD-10-CM | POA: Diagnosis present

## 2020-12-05 DIAGNOSIS — Z8673 Personal history of transient ischemic attack (TIA), and cerebral infarction without residual deficits: Secondary | ICD-10-CM

## 2020-12-05 DIAGNOSIS — K819 Cholecystitis, unspecified: Secondary | ICD-10-CM

## 2020-12-05 DIAGNOSIS — S140XXA Concussion and edema of cervical spinal cord, initial encounter: Secondary | ICD-10-CM | POA: Diagnosis present

## 2020-12-05 DIAGNOSIS — M79645 Pain in left finger(s): Secondary | ICD-10-CM | POA: Diagnosis present

## 2020-12-05 DIAGNOSIS — E876 Hypokalemia: Secondary | ICD-10-CM | POA: Diagnosis present

## 2020-12-05 DIAGNOSIS — R9431 Abnormal electrocardiogram [ECG] [EKG]: Secondary | ICD-10-CM | POA: Diagnosis not present

## 2020-12-05 DIAGNOSIS — N179 Acute kidney failure, unspecified: Secondary | ICD-10-CM | POA: Diagnosis not present

## 2020-12-05 DIAGNOSIS — Z7189 Other specified counseling: Secondary | ICD-10-CM | POA: Diagnosis not present

## 2020-12-05 DIAGNOSIS — J690 Pneumonitis due to inhalation of food and vomit: Secondary | ICD-10-CM | POA: Diagnosis not present

## 2020-12-05 DIAGNOSIS — F1010 Alcohol abuse, uncomplicated: Secondary | ICD-10-CM | POA: Diagnosis present

## 2020-12-05 DIAGNOSIS — M25512 Pain in left shoulder: Secondary | ICD-10-CM | POA: Diagnosis present

## 2020-12-05 DIAGNOSIS — W06XXXA Fall from bed, initial encounter: Secondary | ICD-10-CM | POA: Diagnosis present

## 2020-12-05 DIAGNOSIS — I69320 Aphasia following cerebral infarction: Secondary | ICD-10-CM

## 2020-12-05 DIAGNOSIS — Z9181 History of falling: Secondary | ICD-10-CM

## 2020-12-05 DIAGNOSIS — J189 Pneumonia, unspecified organism: Secondary | ICD-10-CM | POA: Diagnosis not present

## 2020-12-05 DIAGNOSIS — E162 Hypoglycemia, unspecified: Secondary | ICD-10-CM | POA: Diagnosis not present

## 2020-12-05 DIAGNOSIS — R197 Diarrhea, unspecified: Secondary | ICD-10-CM | POA: Diagnosis present

## 2020-12-05 DIAGNOSIS — Z7401 Bed confinement status: Secondary | ICD-10-CM

## 2020-12-05 DIAGNOSIS — Z681 Body mass index (BMI) 19 or less, adult: Secondary | ICD-10-CM

## 2020-12-05 DIAGNOSIS — R532 Functional quadriplegia: Secondary | ICD-10-CM | POA: Diagnosis not present

## 2020-12-05 DIAGNOSIS — E538 Deficiency of other specified B group vitamins: Secondary | ICD-10-CM | POA: Diagnosis present

## 2020-12-05 DIAGNOSIS — I639 Cerebral infarction, unspecified: Secondary | ICD-10-CM

## 2020-12-05 DIAGNOSIS — I6602 Occlusion and stenosis of left middle cerebral artery: Secondary | ICD-10-CM | POA: Diagnosis present

## 2020-12-05 DIAGNOSIS — I4510 Unspecified right bundle-branch block: Secondary | ICD-10-CM | POA: Diagnosis present

## 2020-12-05 DIAGNOSIS — D72829 Elevated white blood cell count, unspecified: Secondary | ICD-10-CM

## 2020-12-05 DIAGNOSIS — I2510 Atherosclerotic heart disease of native coronary artery without angina pectoris: Secondary | ICD-10-CM | POA: Diagnosis present

## 2020-12-05 DIAGNOSIS — G9520 Unspecified cord compression: Secondary | ICD-10-CM | POA: Diagnosis not present

## 2020-12-05 DIAGNOSIS — E559 Vitamin D deficiency, unspecified: Secondary | ICD-10-CM | POA: Diagnosis present

## 2020-12-05 DIAGNOSIS — Z0189 Encounter for other specified special examinations: Secondary | ICD-10-CM

## 2020-12-05 HISTORY — DX: Unspecified convulsions: R56.9

## 2020-12-05 HISTORY — DX: Hyperlipidemia, unspecified: E78.5

## 2020-12-05 LAB — CBC WITH DIFFERENTIAL/PLATELET
Abs Immature Granulocytes: 0.39 10*3/uL — ABNORMAL HIGH (ref 0.00–0.07)
Basophils Absolute: 0 10*3/uL (ref 0.0–0.1)
Basophils Relative: 0 %
Eosinophils Absolute: 0 10*3/uL (ref 0.0–0.5)
Eosinophils Relative: 0 %
HCT: 23.7 % — ABNORMAL LOW (ref 39.0–52.0)
Hemoglobin: 7.2 g/dL — ABNORMAL LOW (ref 13.0–17.0)
Immature Granulocytes: 5 %
Lymphocytes Relative: 11 %
Lymphs Abs: 0.8 10*3/uL (ref 0.7–4.0)
MCH: 28.5 pg (ref 26.0–34.0)
MCHC: 30.4 g/dL (ref 30.0–36.0)
MCV: 93.7 fL (ref 80.0–100.0)
Monocytes Absolute: 0.3 10*3/uL (ref 0.1–1.0)
Monocytes Relative: 4 %
Neutro Abs: 5.6 10*3/uL (ref 1.7–7.7)
Neutrophils Relative %: 80 %
Platelets: 271 10*3/uL (ref 150–400)
RBC: 2.53 MIL/uL — ABNORMAL LOW (ref 4.22–5.81)
RDW: 20.3 % — ABNORMAL HIGH (ref 11.5–15.5)
WBC: 7.2 10*3/uL (ref 4.0–10.5)
nRBC: 0 % (ref 0.0–0.2)

## 2020-12-05 LAB — COMPREHENSIVE METABOLIC PANEL
ALT: 9 U/L (ref 0–44)
AST: 24 U/L (ref 15–41)
Albumin: 2.2 g/dL — ABNORMAL LOW (ref 3.5–5.0)
Alkaline Phosphatase: 118 U/L (ref 38–126)
Anion gap: 14 (ref 5–15)
BUN: 15 mg/dL (ref 8–23)
CO2: 21 mmol/L — ABNORMAL LOW (ref 22–32)
Calcium: 8.5 mg/dL — ABNORMAL LOW (ref 8.9–10.3)
Chloride: 100 mmol/L (ref 98–111)
Creatinine, Ser: 0.66 mg/dL (ref 0.61–1.24)
GFR, Estimated: >60 mL/min (ref >60)
Glucose, Bld: 99 mg/dL (ref 70–99)
Potassium: 2.7 mmol/L — CL (ref 3.5–5.1)
Sodium: 135 mmol/L (ref 135–145)
Total Bilirubin: 0.9 mg/dL (ref 0.3–1.2)
Total Protein: 6.7 g/dL (ref 6.5–8.1)

## 2020-12-05 LAB — ABO/RH: ABO/RH(D): O POS

## 2020-12-05 LAB — URINALYSIS, ROUTINE W REFLEX MICROSCOPIC
Bilirubin Urine: NEGATIVE
Glucose, UA: NEGATIVE mg/dL
Hgb urine dipstick: NEGATIVE
Ketones, ur: 5 mg/dL — AB
Leukocytes,Ua: NEGATIVE
Nitrite: NEGATIVE
Protein, ur: NEGATIVE mg/dL
Specific Gravity, Urine: 1.01 (ref 1.005–1.030)
pH: 6 (ref 5.0–8.0)

## 2020-12-05 LAB — RESP PANEL BY RT-PCR (FLU A&B, COVID) ARPGX2
Influenza A by PCR: NEGATIVE
Influenza B by PCR: NEGATIVE
SARS Coronavirus 2 by RT PCR: NEGATIVE

## 2020-12-05 LAB — PREPARE RBC (CROSSMATCH)

## 2020-12-05 LAB — LACTIC ACID, PLASMA
Lactic Acid, Venous: 1 mmol/L (ref 0.5–1.9)
Lactic Acid, Venous: 1 mmol/L (ref 0.5–1.9)

## 2020-12-05 LAB — APTT: aPTT: 46 seconds — ABNORMAL HIGH (ref 24–36)

## 2020-12-05 LAB — PROTIME-INR
INR: 1.2 (ref 0.8–1.2)
Prothrombin Time: 14.3 seconds (ref 11.4–15.2)

## 2020-12-05 LAB — POC OCCULT BLOOD, ED: Fecal Occult Bld: NEGATIVE

## 2020-12-05 LAB — TSH: TSH: 4.426 u[IU]/mL (ref 0.350–4.500)

## 2020-12-05 LAB — MAGNESIUM: Magnesium: 1.7 mg/dL (ref 1.7–2.4)

## 2020-12-05 IMAGING — DX DG CHEST 1V PORT
1 series · 1 of 1 positions shown · non-contrast
Comparison: [DATE].

CLINICAL DATA: Questionable sepsis.

EXAM:
PORTABLE CHEST 1 VIEW

[chest ap]
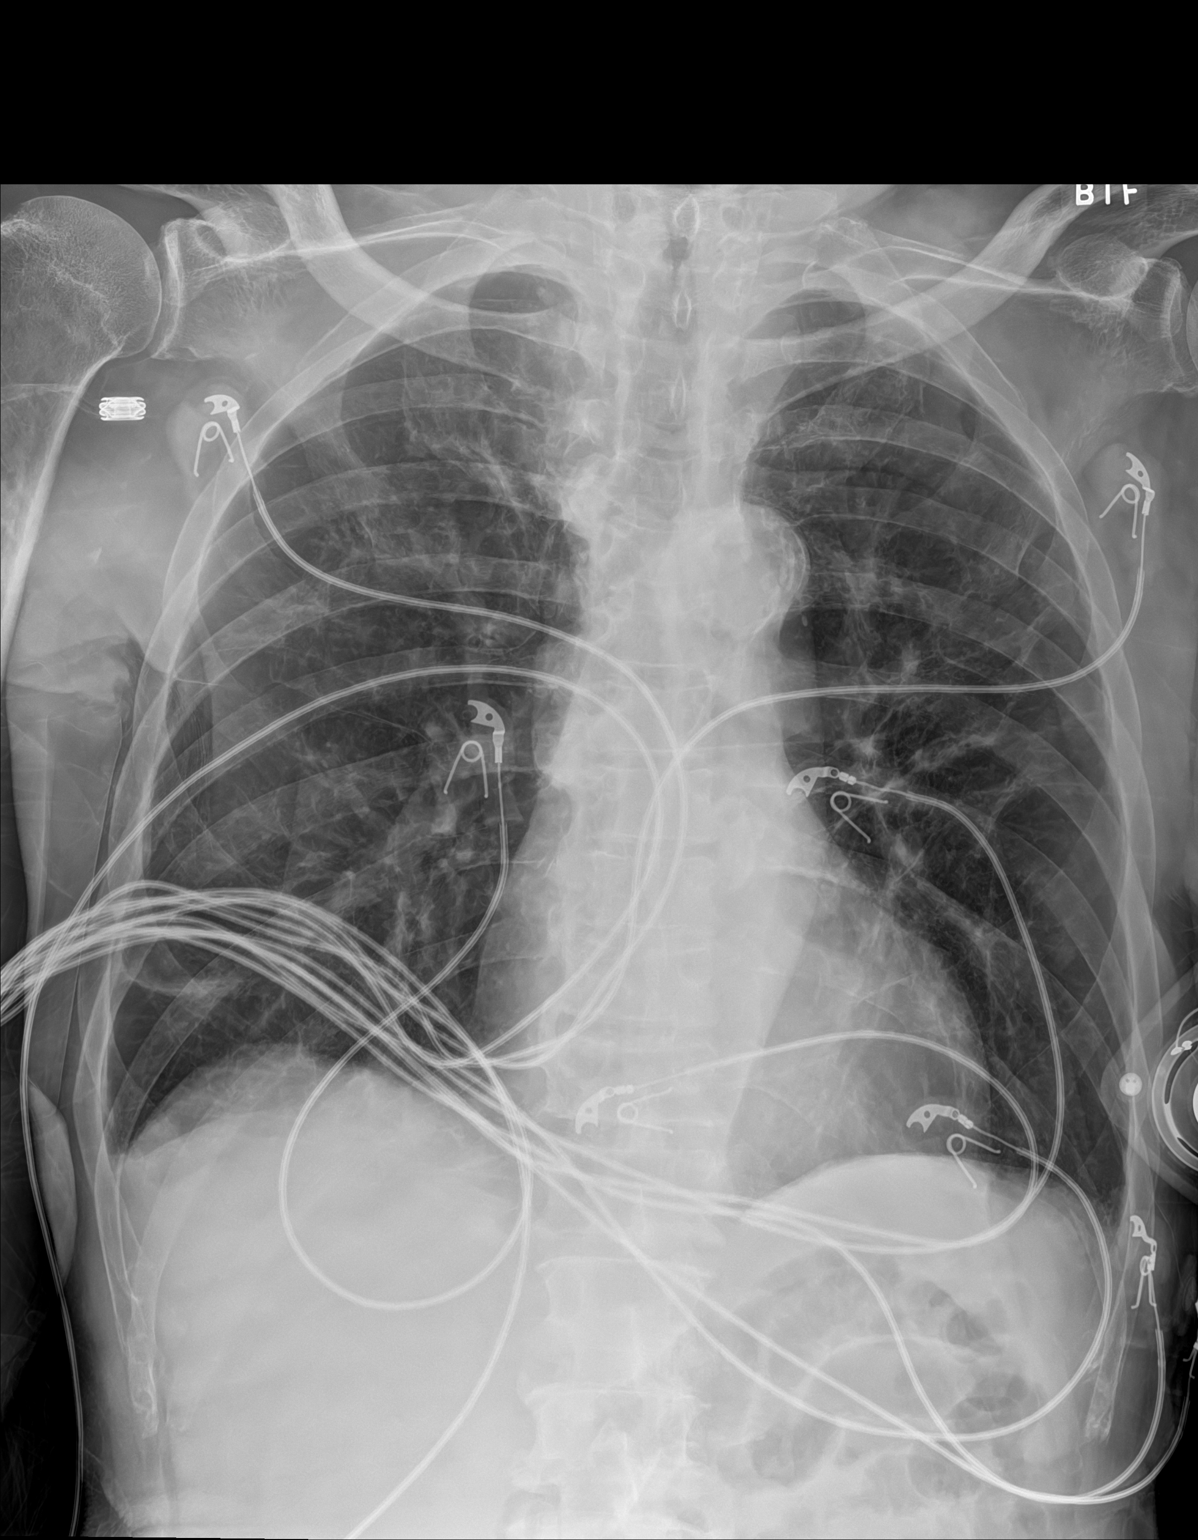

[1 of 1 positions shown; findings below may reference images not displayed]

FINDINGS: The heart size and mediastinal contours are within normal limits.
Aortic atherosclerosis. No consolidation. Similar mild right basilar
atelectasis/scar. No visible pleural effusions or pneumothorax.
Right-sided skin fold. No acute osseous abnormality. EKG leads
project over the chest.
IMPRESSION: No acute cardiopulmonary disease.

## 2020-12-05 IMAGING — DX DG HAND COMPLETE 3+V*L*
3 series · 3 of 3 positions shown · non-contrast
Comparison: None.

CLINICAL DATA: Fall

EXAM:
LEFT HAND - COMPLETE 3+ VIEW

[hand ap]
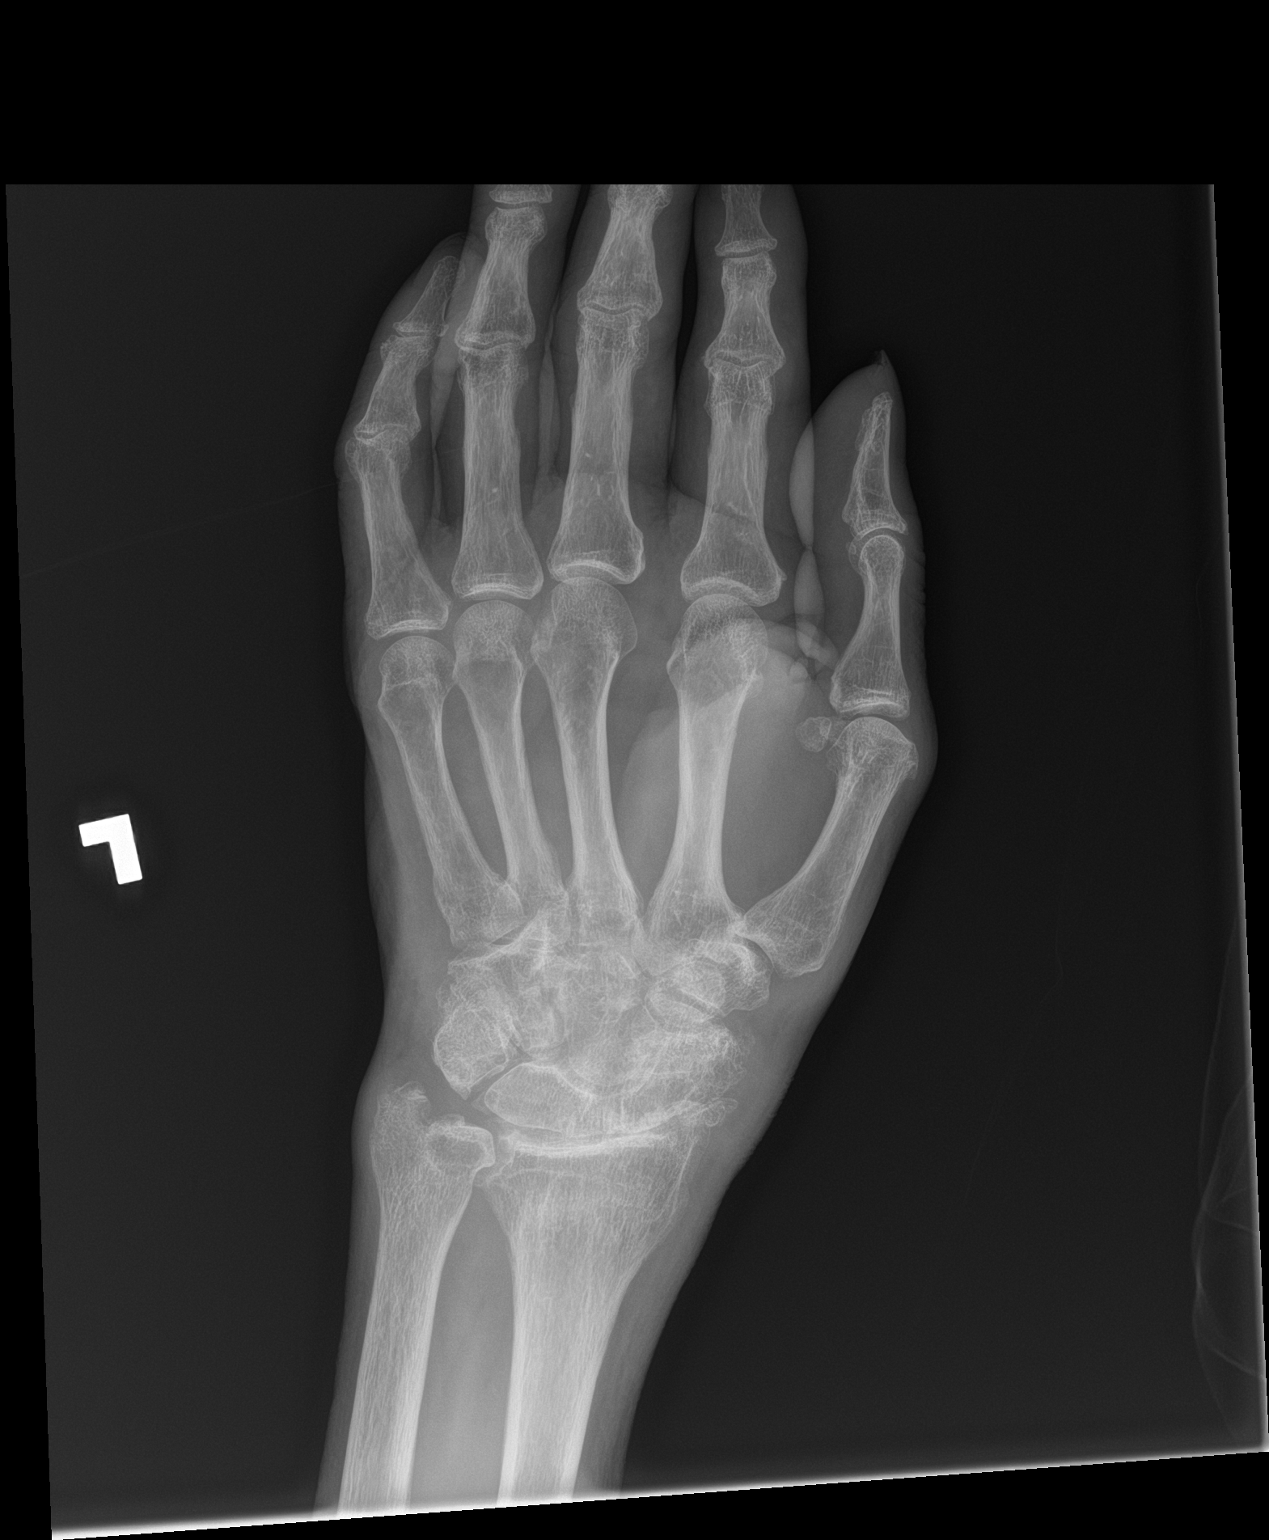

[hand lat]
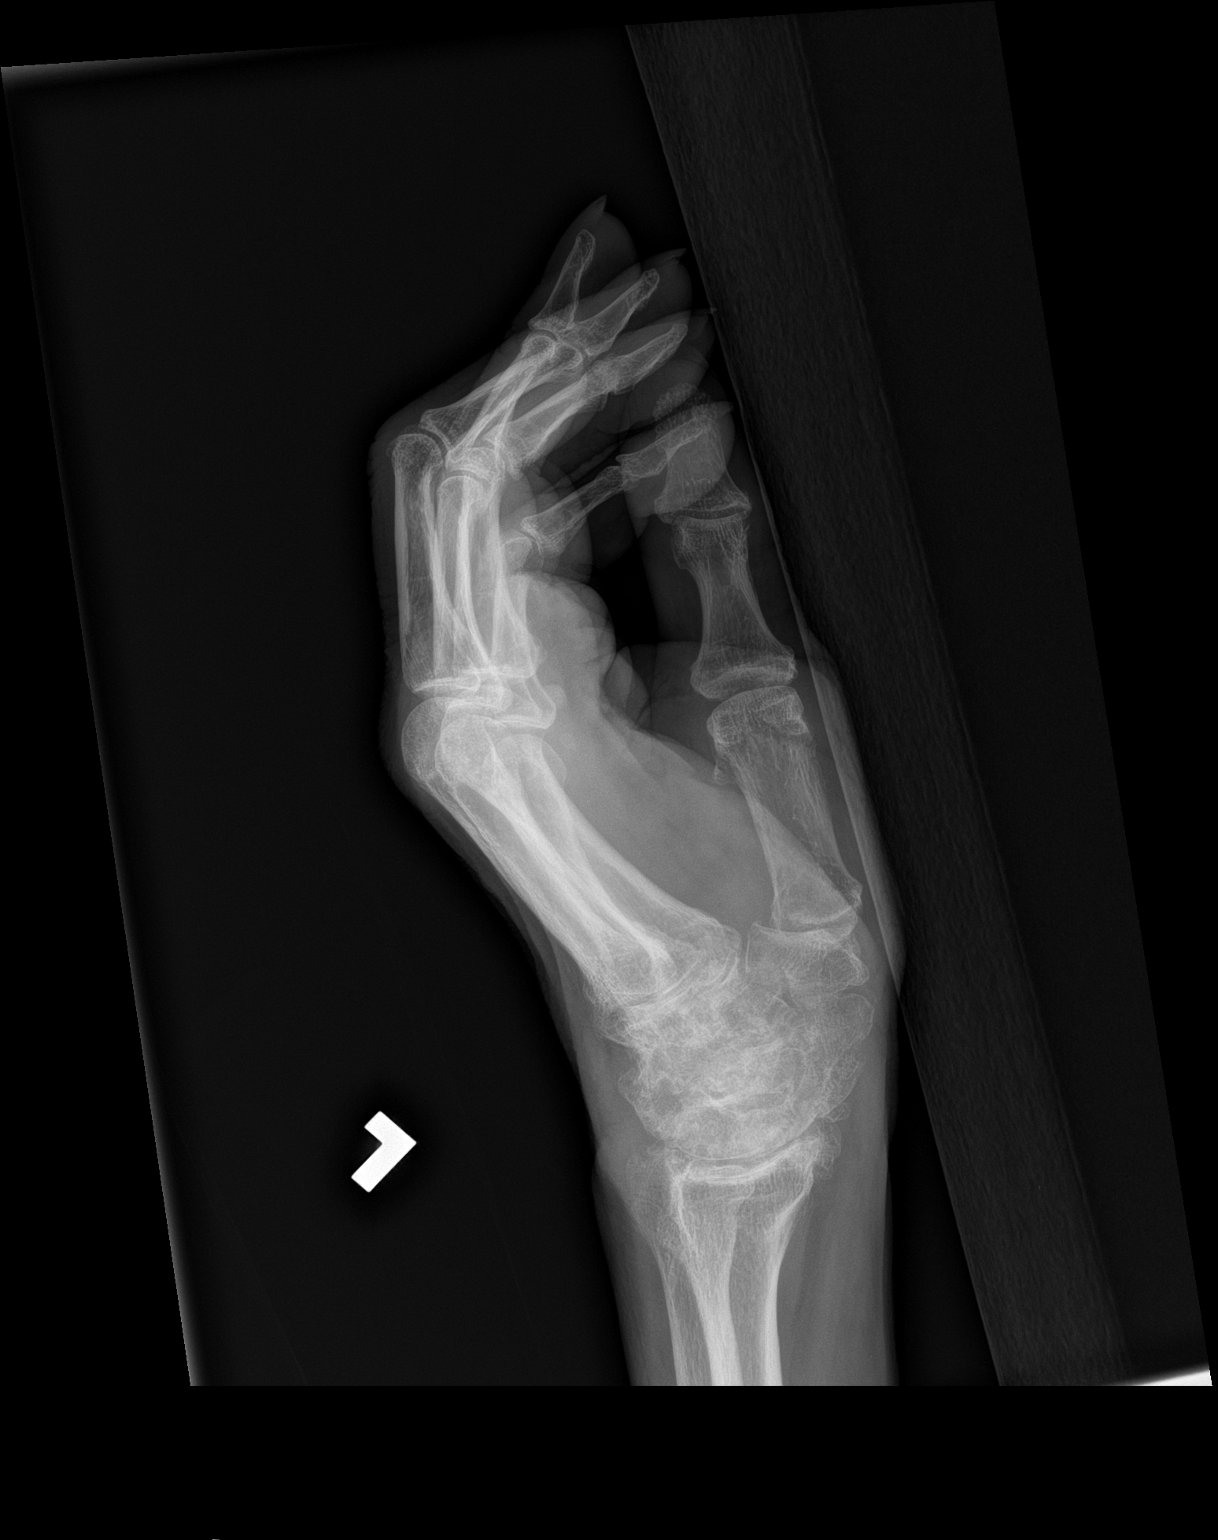

[hand obl]
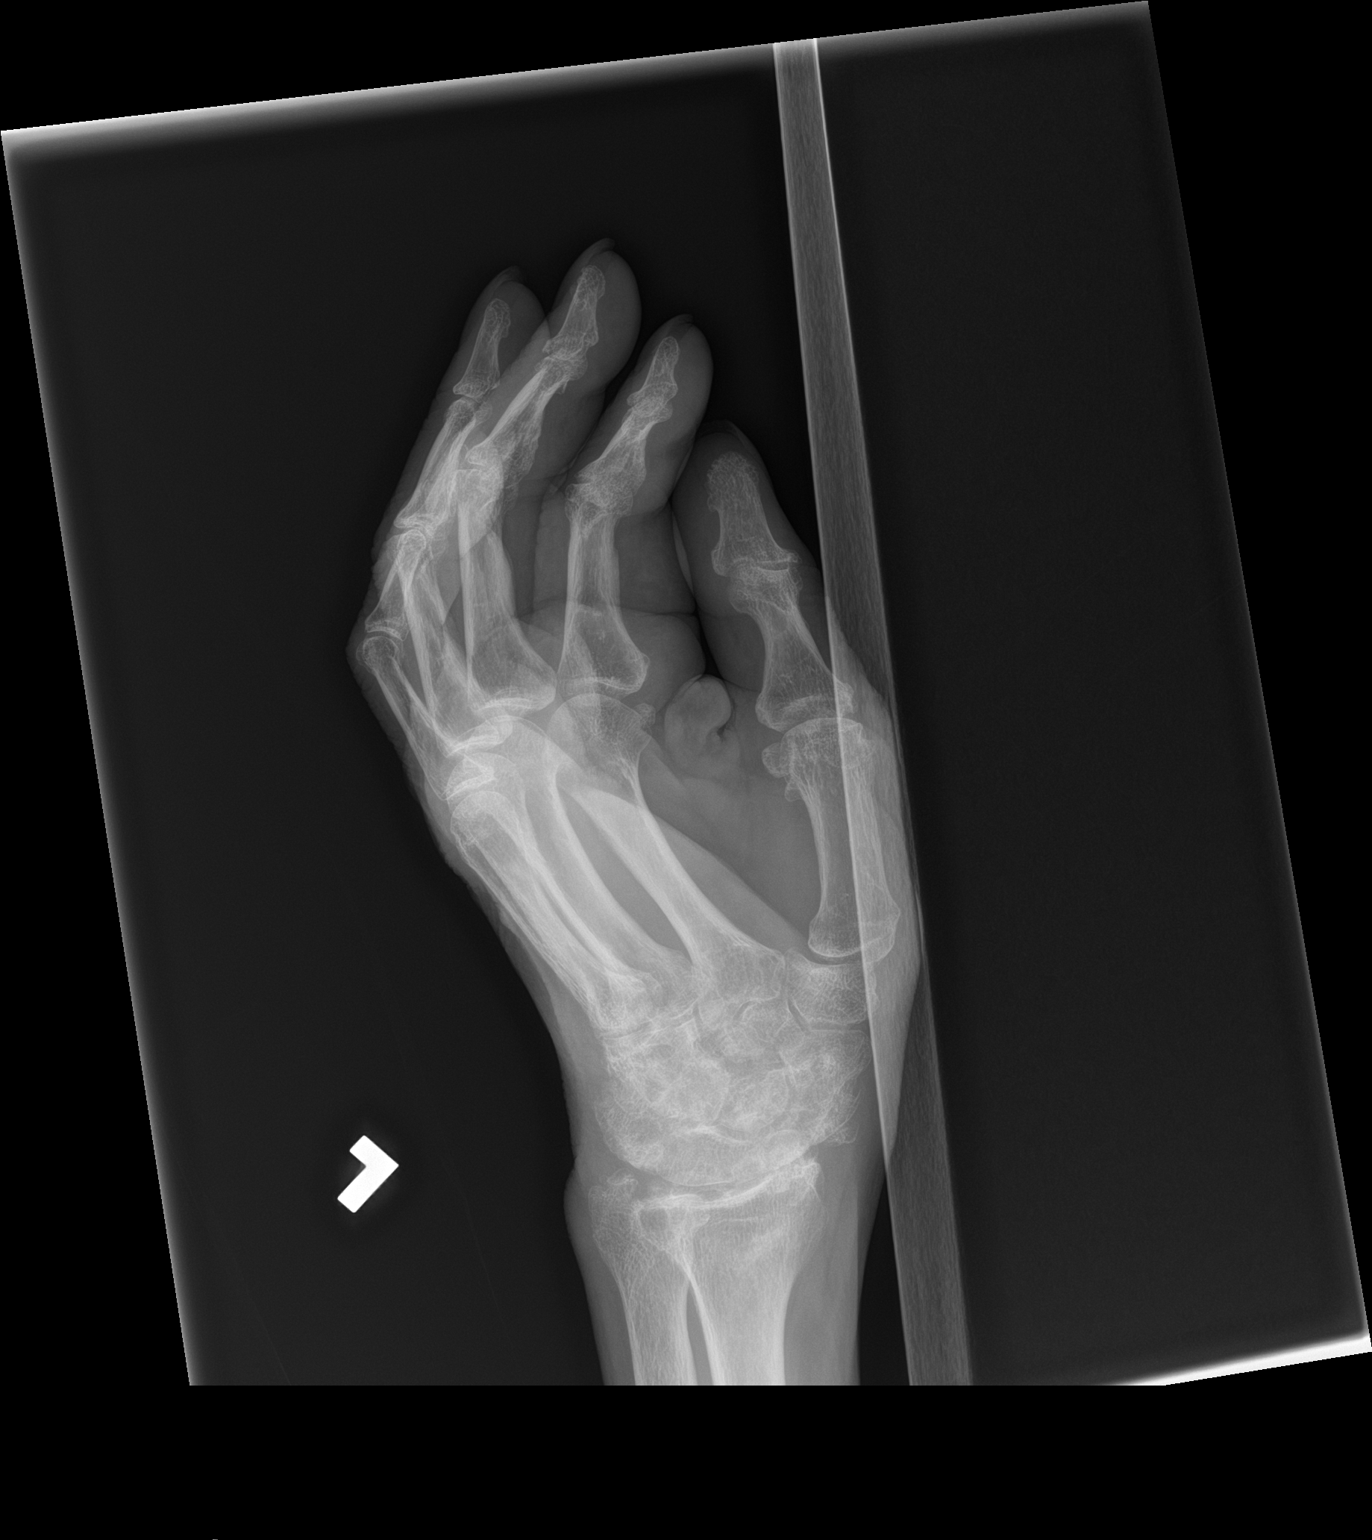

[3 of 3 positions shown; findings below may reference images not displayed]

FINDINGS: Negative for acute fracture

Moderate degenerative change in the radiocarpal joint with joint
space narrowing and spurring. No erosion identified.
IMPRESSION: Negative for fracture.

## 2020-12-05 IMAGING — DX DG SHOULDER 2+V*L*
2 series · 2 of 2 positions shown · non-contrast
Comparison: None.

CLINICAL DATA: Fall

EXAM:
LEFT SHOULDER - 2+ VIEW

[shoulder ap (1 of 2)]
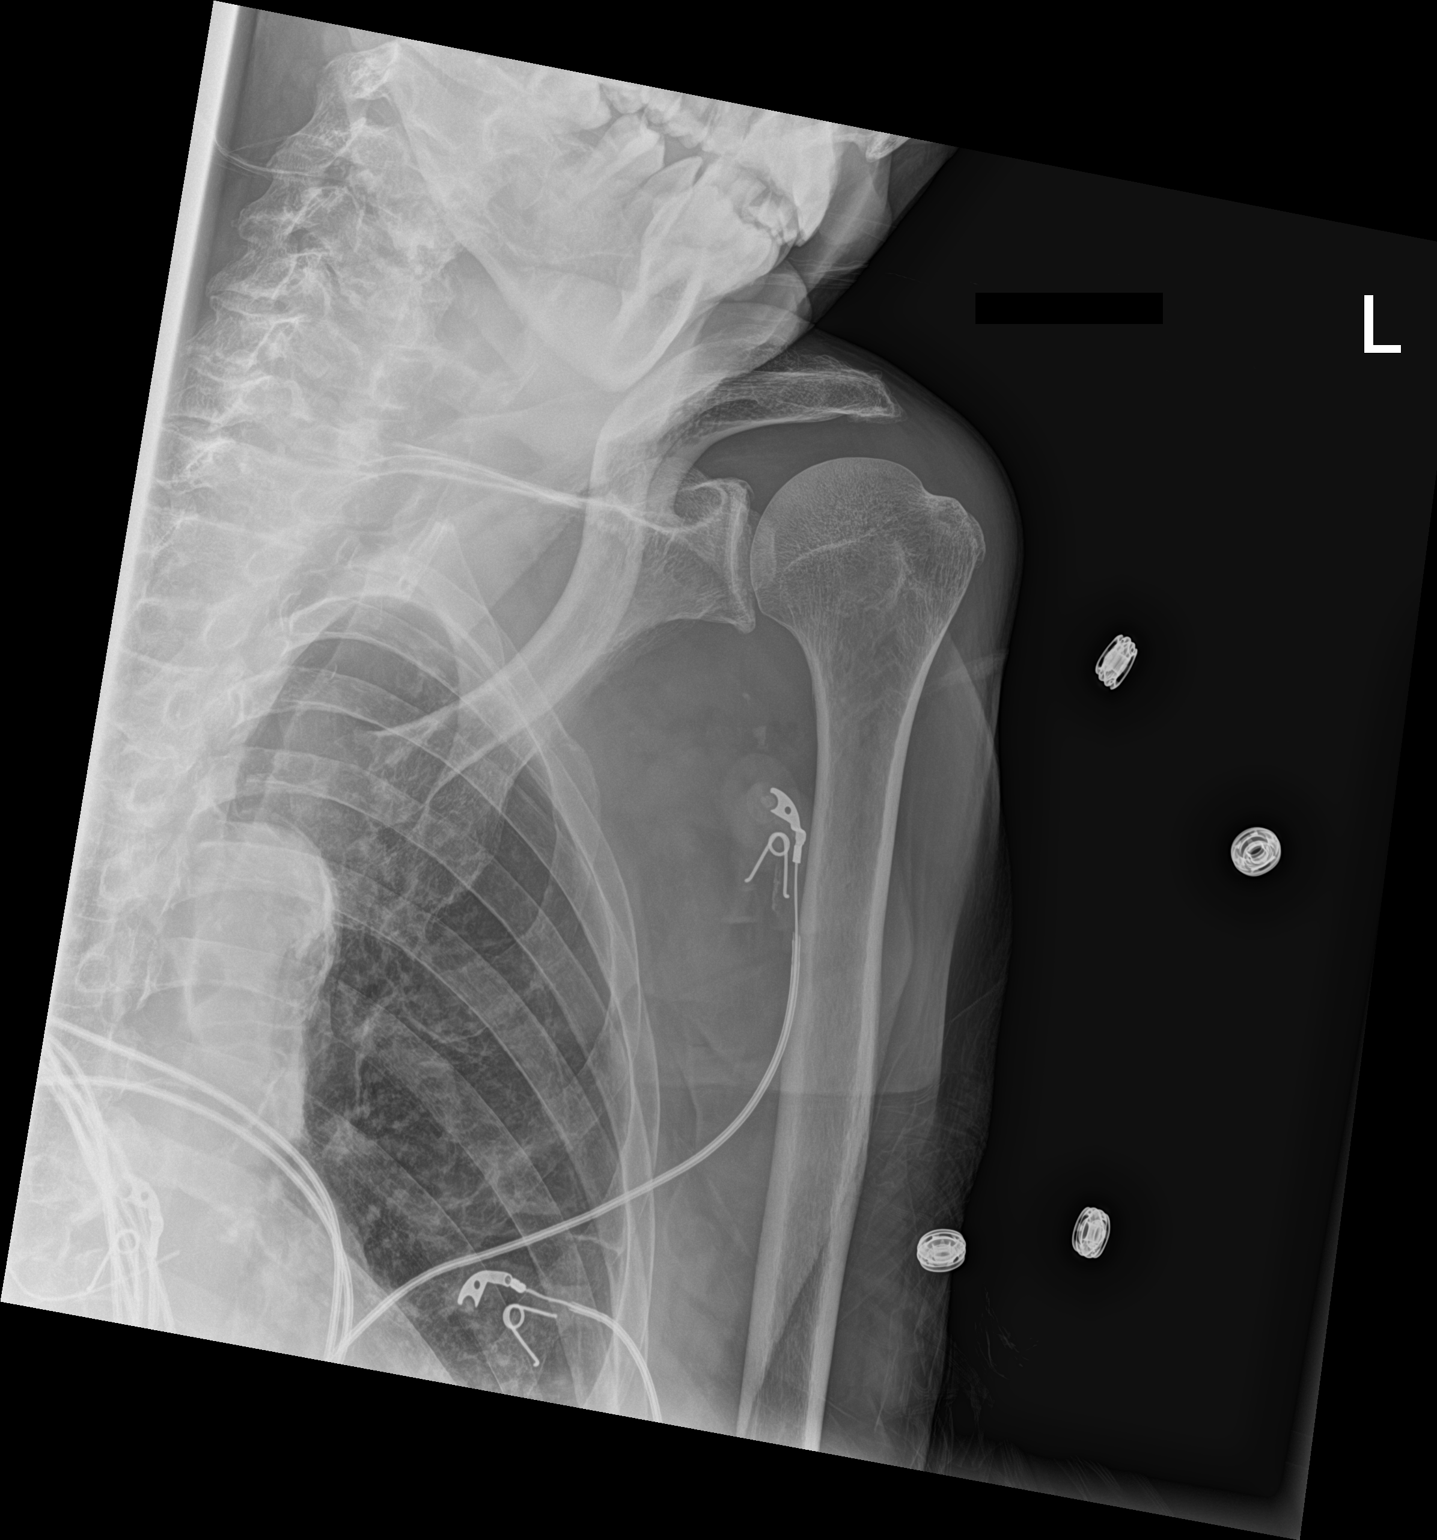

[shoulder ap (2 of 2)]
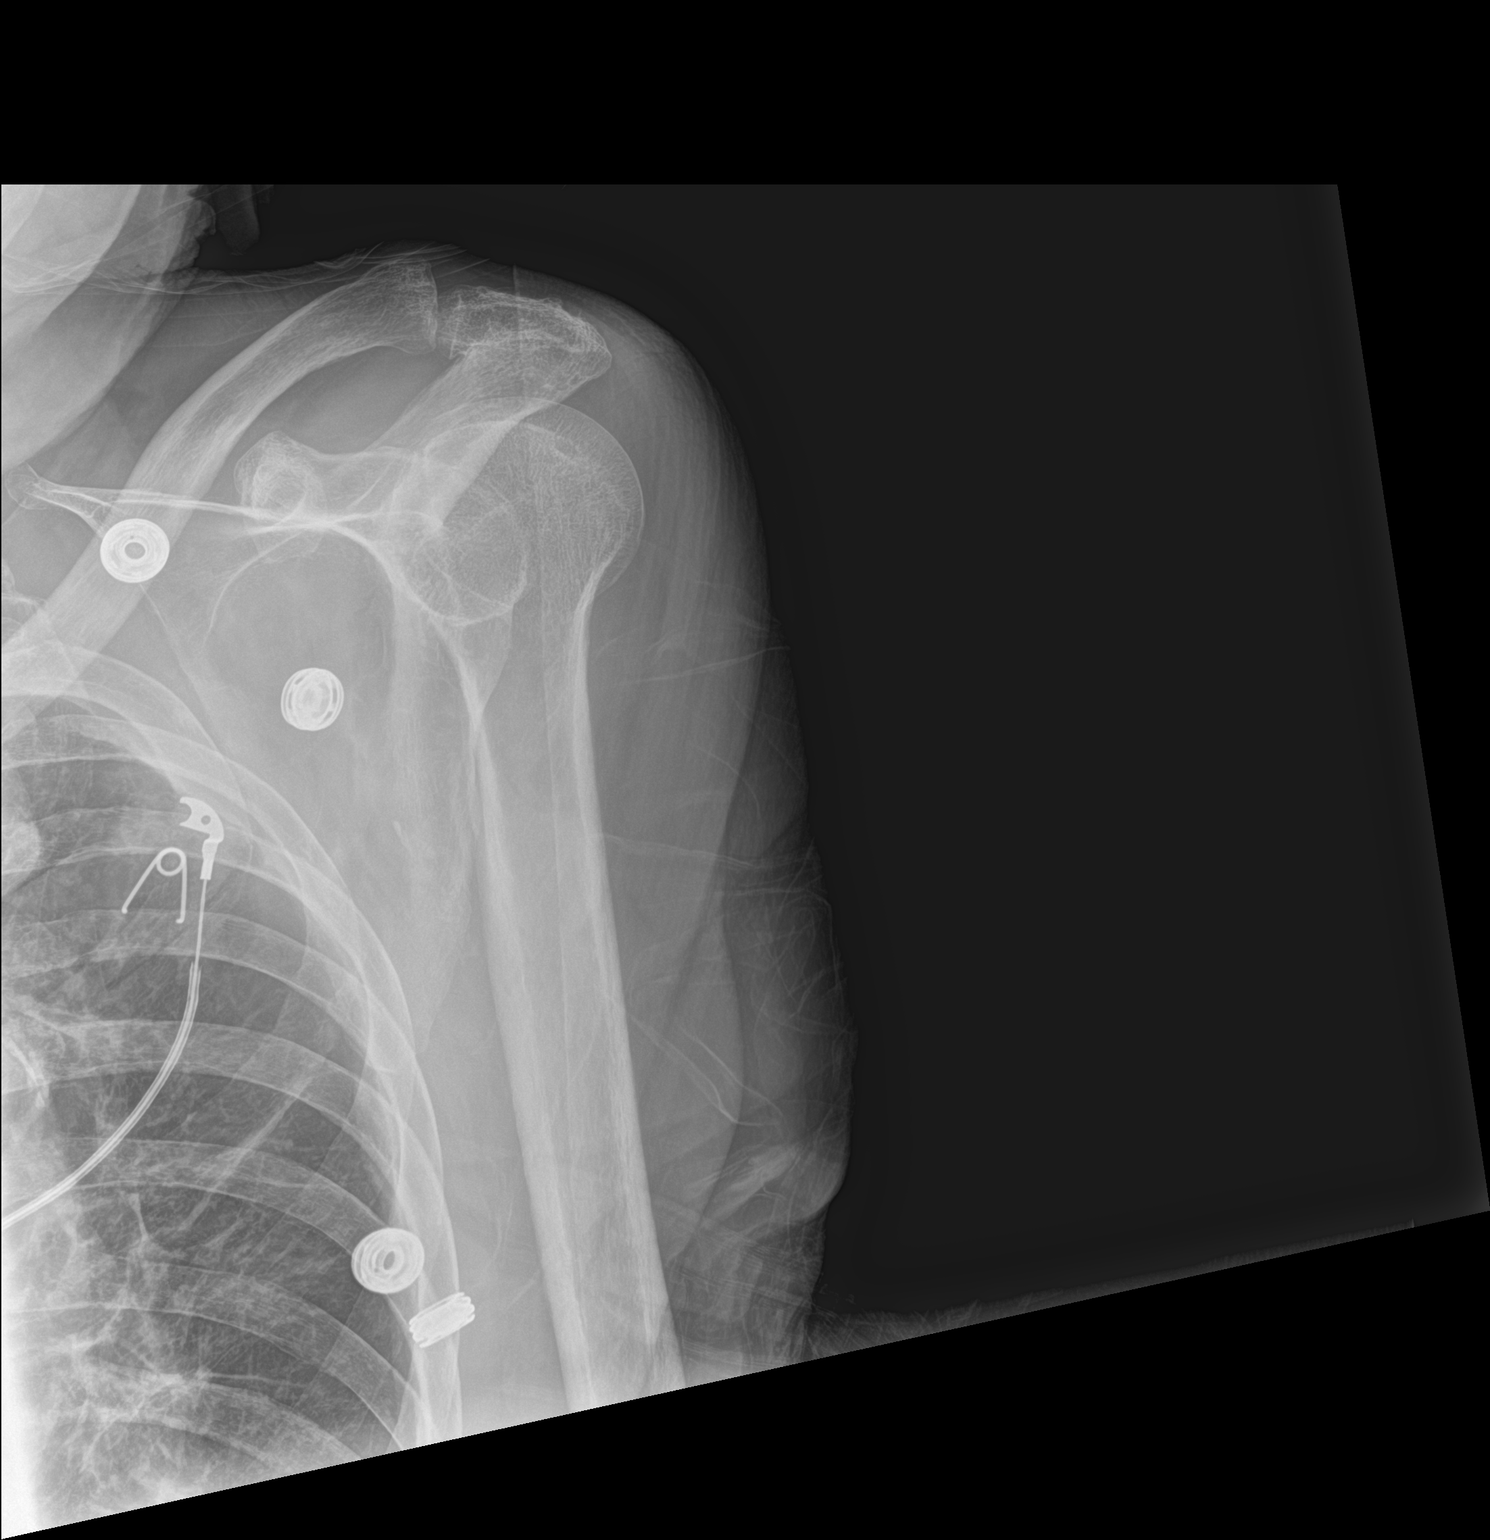

[2 of 2 positions shown; findings below may reference images not displayed]

FINDINGS: There is no evidence of fracture or dislocation. There is no
evidence of arthropathy or other focal bone abnormality. Soft
tissues are unremarkable.
IMPRESSION: Negative.

## 2020-12-05 IMAGING — DX DG FOREARM 2V*L*
2 series · 2 of 2 positions shown · non-contrast
Comparison: None.

CLINICAL DATA: Fall

EXAM:
LEFT FOREARM - 2 VIEW

[forearm ap]
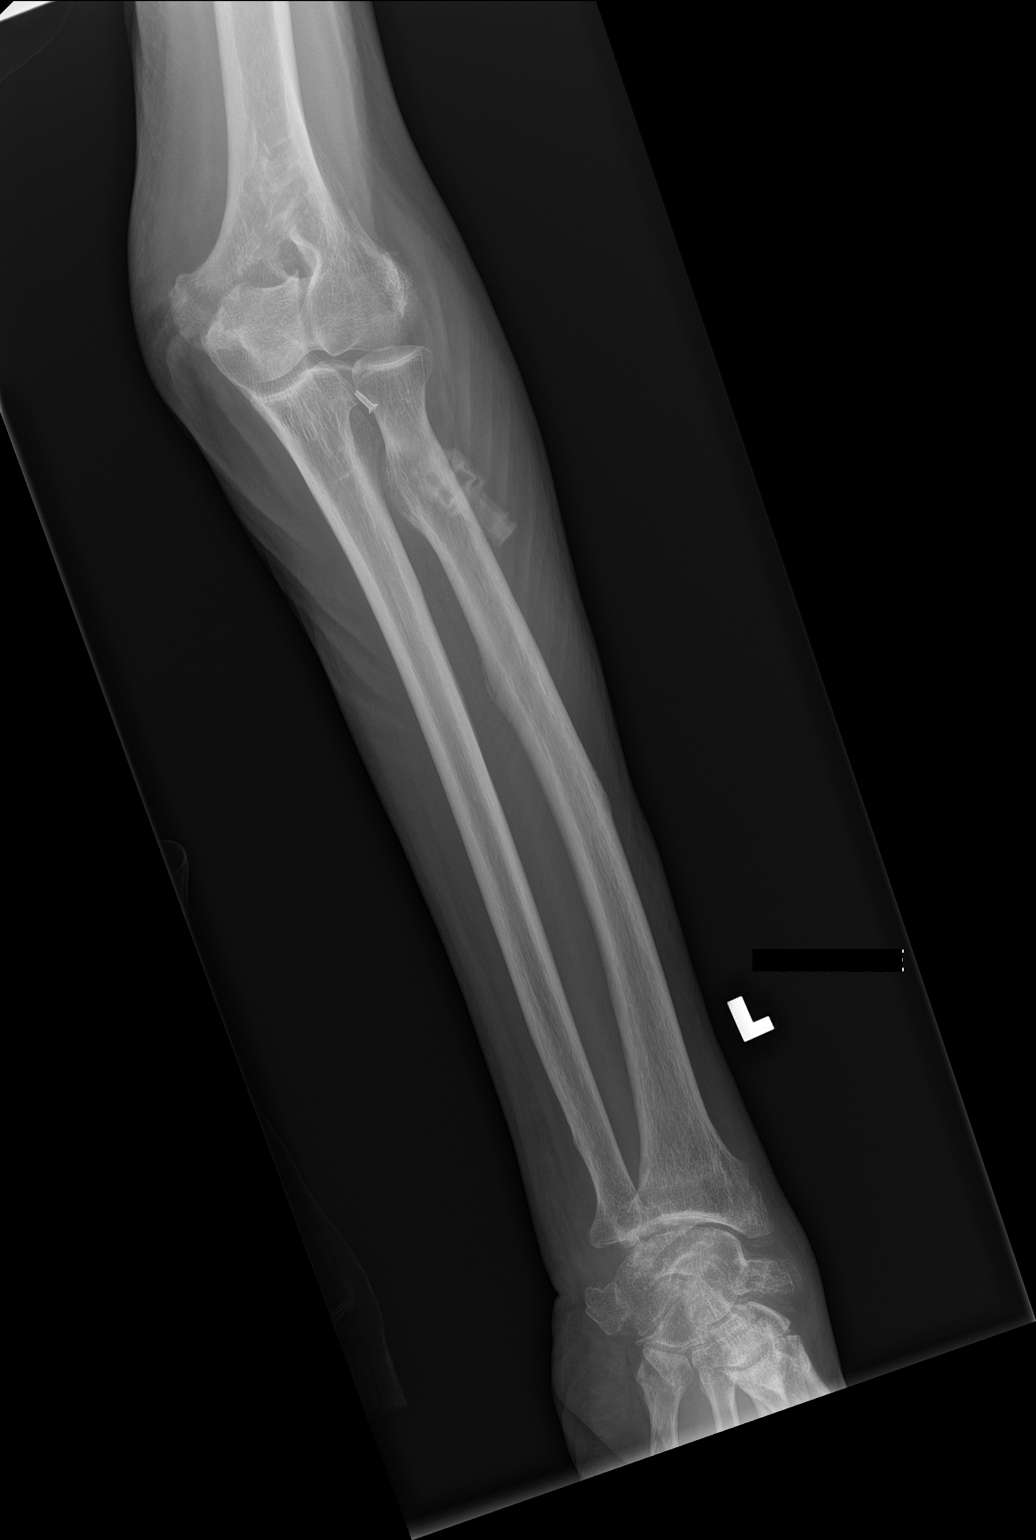

[forearm lat]
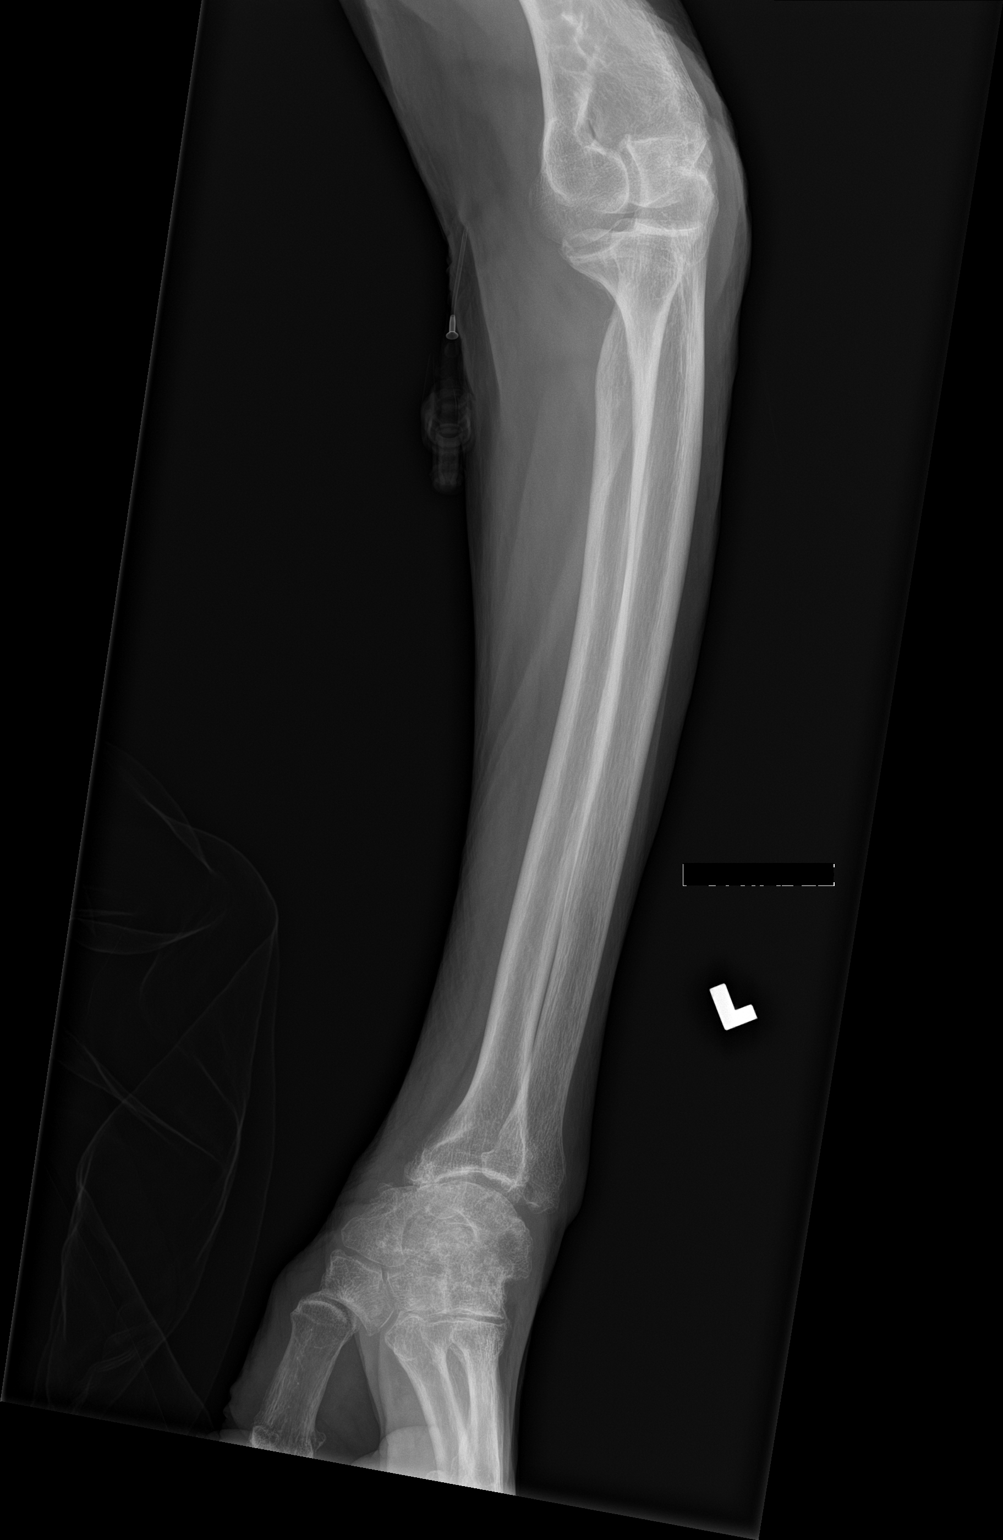

[2 of 2 positions shown; findings below may reference images not displayed]

FINDINGS: There is no evidence of fracture or other focal bone lesions. Soft
tissues are unremarkable. Degenerative change in the radiocarpal
joint.
IMPRESSION: Negative for fracture.

## 2020-12-05 MED ORDER — POTASSIUM CHLORIDE 10 MEQ/100ML IV SOLN
10.0000 meq | INTRAVENOUS | Status: AC
  Administered 2020-12-05 – 2020-12-06 (×2): 10 meq via INTRAVENOUS
  Filled 2020-12-05 (×2): qty 100

## 2020-12-05 MED ORDER — LACTATED RINGERS IV BOLUS (SEPSIS)
250.0000 mL | Freq: Once | INTRAVENOUS | Status: AC
  Administered 2020-12-05: 15:00:00 250 mL via INTRAVENOUS

## 2020-12-05 MED ORDER — ENSURE ENLIVE PO LIQD
237.0000 mL | Freq: Two times a day (BID) | ORAL | Status: DC
  Administered 2020-12-06 – 2020-12-23 (×27): 237 mL via ORAL
  Filled 2020-12-05 (×6): qty 237

## 2020-12-05 MED ORDER — POTASSIUM CHLORIDE 10 MEQ/100ML IV SOLN
10.0000 meq | Freq: Once | INTRAVENOUS | Status: AC
  Administered 2020-12-05: 16:00:00 10 meq via INTRAVENOUS
  Filled 2020-12-05: qty 100

## 2020-12-05 MED ORDER — FOLIC ACID 1 MG PO TABS
1.0000 mg | ORAL_TABLET | Freq: Every day | ORAL | Status: DC
  Administered 2020-12-06 – 2020-12-23 (×17): 1 mg via ORAL
  Filled 2020-12-05 (×17): qty 1

## 2020-12-05 MED ORDER — POTASSIUM CHLORIDE CRYS ER 20 MEQ PO TBCR
40.0000 meq | EXTENDED_RELEASE_TABLET | Freq: Once | ORAL | Status: AC
  Administered 2020-12-05: 23:00:00 40 meq via ORAL
  Filled 2020-12-05: qty 2

## 2020-12-05 MED ORDER — SODIUM CHLORIDE 0.9 % IV SOLN
1.0000 g | INTRAVENOUS | Status: DC
  Administered 2020-12-05: 13:00:00 1 g via INTRAVENOUS
  Filled 2020-12-05: qty 10

## 2020-12-05 MED ORDER — LACTATED RINGERS IV BOLUS (SEPSIS)
1000.0000 mL | Freq: Once | INTRAVENOUS | Status: AC
  Administered 2020-12-05: 13:00:00 1000 mL via INTRAVENOUS

## 2020-12-05 MED ORDER — PANTOPRAZOLE SODIUM 40 MG PO TBEC
40.0000 mg | DELAYED_RELEASE_TABLET | Freq: Every day | ORAL | Status: DC
  Administered 2020-12-06: 09:00:00 40 mg via ORAL
  Filled 2020-12-05: qty 1

## 2020-12-05 MED ORDER — ATORVASTATIN CALCIUM 40 MG PO TABS
40.0000 mg | ORAL_TABLET | Freq: Every day | ORAL | Status: DC
  Administered 2020-12-06 – 2020-12-23 (×17): 40 mg via ORAL
  Filled 2020-12-05 (×18): qty 1

## 2020-12-05 MED ORDER — LACTATED RINGERS IV SOLN
INTRAVENOUS | Status: AC
Start: 2020-12-05 — End: 2020-12-06

## 2020-12-05 MED ORDER — POTASSIUM CHLORIDE 10 MEQ/100ML IV SOLN
10.0000 meq | Freq: Once | INTRAVENOUS | Status: AC
  Administered 2020-12-05: 15:00:00 10 meq via INTRAVENOUS
  Filled 2020-12-05: qty 100

## 2020-12-05 MED ORDER — SODIUM CHLORIDE 0.9% IV SOLUTION: Freq: Once | INTRAVENOUS | Status: AC

## 2020-12-05 MED ORDER — LACTATED RINGERS IV BOLUS (SEPSIS)
500.0000 mL | Freq: Once | INTRAVENOUS | Status: AC
  Administered 2020-12-05: 14:00:00 500 mL via INTRAVENOUS

## 2020-12-05 MED ORDER — MAGNESIUM SULFATE 2 GM/50ML IV SOLN
2.0000 g | Freq: Once | INTRAVENOUS | Status: AC
  Administered 2020-12-05: 22:00:00 2 g via INTRAVENOUS
  Filled 2020-12-05: qty 50

## 2020-12-05 NOTE — ED Triage Notes (Signed)
Pt c/o of weakness and numbness all over that began this morning.  Unable to obtain oral temperature at this time.  Pt is Hypotensive in triage at 89/61.

## 2020-12-05 NOTE — ED Notes (Signed)
Pt noted to have dark stool per Ulanda Edison during assessment.  Burgess Amor, PA informed.

## 2020-12-05 NOTE — ED Notes (Signed)
CRITICAL VALUE ALERT  Critical Value:  Potassium 2.7  Date & Time Notied:  12/04/2020 @ 1407  Provider Notified: Dr Deretha Emory  Orders Received/Actions taken: see new order.

## 2020-12-05 NOTE — ED Notes (Signed)
Placed on bair hugger

## 2020-12-05 NOTE — ED Provider Notes (Addendum)
[facility] EMERGENCY DEPARTMENT Provider Note   CSN: [id number] Arrival date & time: 12/12/2020  1109     History Chief Complaint  Patient presents with  . Weakness    Stanley Scott is a 69 y.o. male presenting for evaluation of generalized weakness, diarrhea and generalized numb sensation which is been present in all extremities since yesterday.  He has a history of a recent UTI, was seen here 10 days ago and treated with a course of Keflex.  Per patient and his son per telephone, he now has loose to watery stools, multiple episodes per day for the past week.  He has had a poor appetite but family has been pushing p.o. fluid.  Patient denies dysuria, also denies abdominal pain, nausea or vomiting, also denies chest pain or shortness of breath.  At baseline he is very sedentary, lives with his son who provides significant assistance with ADLs.     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     Son now at bedside reports pt fell this am when he was trying to transfer from his bed to the bedside commode (around 2 am).  He fell, landing on his outstretched left upper extremity, reports thumb, forearm and shoulder pain.  He denies head injury.  Reports poor PO intake but has been tolerating fluids.  pcp started him on mirtazepine to stimulate appetite, also giving pt Ensure for nutrition, but still has no appetite.  The history is provided by the patient and a relative.       History reviewed. No pertinent past medical history.  There are no problems to display for this patient.   History reviewed. No pertinent surgical history.     History reviewed. No pertinent family history.  Social  History   Tobacco Use  . Smoking status: Never Smoker  . Smokeless tobacco: Never Used  Vaping Use  . Vaping Use: Never used  Substance Use Topics  . Alcohol use: Not Currently  . Drug use: Never    Home Medications Prior to Admission medications   Medication Sig Start Date End Date Taking? Authorizing Provider  acetaminophen (TYLENOL) 500 MG tablet Take 500 mg by mouth every 6 (six) hours as needed for moderate pain or headache.   Yes [provider]  atorvastatin (LIPITOR) 40 MG tablet Take 40 mg by mouth daily. 10/08/20  Yes [provider]  cephALEXin (KEFLEX) 500 MG capsule Take 1 capsule (500 mg total) by mouth 4 (four) times daily. 11/25/20  Yes Vanetta Mulders, MD  ergocalciferol (VITAMIN D2) 1.25 MG (50000 UT) capsule Take 50,000 Units by mouth once a week. 10/08/20  Yes [provider]  folic acid (FOLVITE) 1 MG tablet Take 1 mg by mouth daily. 10/08/20  Yes [provider]  metoprolol tartrate (LOPRESSOR) 50 MG tablet Take 50 mg by mouth 2 (two) times daily. 11/30/20  Yes [provider]  pantoprazole (PROTONIX) 40 MG tablet Take 40 mg by mouth daily. 10/08/20  Yes [provider]  sodium chloride 1 g tablet Take 1 g by mouth 2 (two) times daily with a meal. 10/08/20  Yes [provider]  thiamine 100 MG tablet Take 100 mg by mouth daily. Patient not taking: No sig reported 10/08/20  [provider]    Allergies    Patient has no allergy information on record.  Review of Systems   Review of Systems  Constitutional: Positive for appetite change. Negative for chills and fever.  HENT: Negative for congestion and sore throat.   Eyes: Negative.   Respiratory: Negative for cough, chest tightness and shortness of breath.   Cardiovascular: Negative for chest pain.  Gastrointestinal: Positive for diarrhea. Negative for abdominal pain, nausea and vomiting.  Genitourinary: Negative.  Negative for dysuria.   Musculoskeletal: Negative for arthralgias, joint swelling and neck pain.  Skin: Negative.  Negative for rash and wound.  Neurological: Positive for weakness. Negative for dizziness, light-headedness, numbness and headaches.  Psychiatric/Behavioral: Negative.     Physical Exam Updated Vital Signs BP (!) 101/57   Pulse (!) 106   Temp (!) 97.2 F (36.2 C)   Resp 12   SpO2 98%   Physical Exam Vitals and nursing note reviewed. Exam conducted with a chaperone present.  Constitutional:      Appearance: He is well-developed, underweight and well-nourished. He is ill-appearing.  HENT:     Head: Normocephalic and atraumatic.     Mouth/Throat:     Mouth: Mucous membranes are moist.  Eyes:     Conjunctiva/sclera: Conjunctivae normal.  Cardiovascular:     Rate and Rhythm: Regular rhythm. Tachycardia present.     Pulses: Intact distal pulses.     Heart sounds: Normal heart sounds.  Pulmonary:     Effort: Pulmonary effort is normal.     Breath sounds: Normal breath sounds. No wheezing.  Abdominal:     General: Bowel sounds are normal.     Palpations: Abdomen is soft.     Tenderness: There is no abdominal tenderness. There is no guarding.  Genitourinary:    Rectum: Guaiac result negative.  Musculoskeletal:        General: Normal range of motion.     Cervical back: Normal range of motion.  Skin:    General: Skin is warm and dry.  Neurological:     Mental Status: He is alert. Mental status is at baseline.     Comments: Pt moves all 4 extremities.    Psychiatric:        Mood and Affect: Mood and affect normal.     ED Results / Procedures / Treatments   Labs (all labs ordered are listed, but only abnormal results are displayed) Labs Reviewed  COMPREHENSIVE METABOLIC PANEL - Abnormal; Notable for the following components:      Result Value   Potassium 2.7 (*)    CO2 21 (*)    Calcium 8.5 (*)    Albumin 2.2 (*)    All other components within normal limits  CBC WITH  DIFFERENTIAL/PLATELET - Abnormal; Notable for the following components:   RBC 2.53 (*)    Hemoglobin 7.2 (*)    HCT 23.7 (*)    RDW 20.3 (*)    Abs Immature Granulocytes 0.39 (*)    All other components within normal limits  APTT - Abnormal; Notable for the following components:   aPTT 46 (*)    All other components within normal limits  URINALYSIS, ROUTINE W REFLEX MICROSCOPIC - Abnormal; Notable for the following components:   Ketones, ur 5 (*)    All other components within normal limits  CULTURE, BLOOD (ROUTINE X 2)  CULTURE, BLOOD (ROUTINE X 2)  RESP PANEL BY RT-PCR (FLU A&B, COVID) ARPGX2  URINE CULTURE  C DIFFICILE QUICK  SCREEN W PCR REFLEX  LACTIC ACID, PLASMA  LACTIC ACID, PLASMA  PROTIME-INR  MAGNESIUM  TSH  POC OCCULT BLOOD, ED  TYPE AND SCREEN    EKG EKG Interpretation  Date/Time:  Monday December 05 2020 11:33:15 EST Ventricular Rate:  103 PR Interval:  166 QRS Duration: 136 QT Interval:  462 QTC Calculation: 605 R Axis:   88 Text Interpretation: Sinus tachycardia with occasional Premature ventricular complexes Right bundle branch block Cannot rule out Inferior infarct , age undetermined Abnormal ECG No significant change since prior 12/21 Confirmed by Meridee Score [phone]) on 11/18/2020 4:34:03 PM   Radiology DG Forearm Left  Result Date: 11/14/2020 CLINICAL DATA:  Fall EXAM: LEFT FOREARM - 2 VIEW COMPARISON:  None. FINDINGS: There is no evidence of fracture or other focal bone lesions. Soft tissues are unremarkable. Degenerative change in the radiocarpal joint. IMPRESSION: Negative for fracture. Electronically Signed   By: Marlan Palau M.D.   On: 11/12/2020 16:25   DG Chest Port 1 View  Result Date: 11/12/2020 CLINICAL DATA:  Questionable sepsis. EXAM: PORTABLE CHEST 1 VIEW COMPARISON:  December 17, 21. FINDINGS: The heart size and mediastinal contours are within normal limits. Aortic atherosclerosis. No consolidation. Similar mild right basilar  atelectasis/scar. No visible pleural effusions or pneumothorax. Right-sided skin fold. No acute osseous abnormality. EKG leads project over the chest. IMPRESSION: No acute cardiopulmonary disease. Electronically Signed   By: Feliberto Harts MD   On: 11/16/2020 13:23   DG Shoulder Left  Result Date: 11/09/2020 CLINICAL DATA:  Fall EXAM: LEFT SHOULDER - 2+ VIEW COMPARISON:  None. FINDINGS: There is no evidence of fracture or dislocation. There is no evidence of arthropathy or other focal bone abnormality. Soft tissues are unremarkable. IMPRESSION: Negative. Electronically Signed   By: Marlan Palau M.D.   On: 12/08/2020 16:26   DG Hand Complete Left  Result Date: 11/12/2020 CLINICAL DATA:  Fall EXAM: LEFT HAND - COMPLETE 3+ VIEW COMPARISON:  None. FINDINGS: Negative for acute fracture Moderate degenerative change in the radiocarpal joint with joint space narrowing and spurring. No erosion identified. IMPRESSION: Negative for fracture. Electronically Signed   By: Marlan Palau M.D.   On: 11/14/2020 16:24    Procedures Procedures (including critical care time)  CRITICAL CARE Performed by: Burgess Amor Total critical care time: 35 minutes Critical care time was exclusive of separately billable procedures and treating other patients. Critical care was necessary to treat or prevent imminent or life-threatening deterioration. Critical care was time spent personally by me on the following activities: development of treatment plan with patient and/or surrogate as well as nursing, discussions with consultants, evaluation of patient's response to treatment, examination of patient, obtaining history from patient or surrogate, ordering and performing treatments and interventions, ordering and review of laboratory studies, ordering and review of radiographic studies, pulse oximetry and re-evaluation of patient's condition.   Medications Ordered in ED Medications  lactated ringers infusion ( Intravenous  New Bag/Given 11/21/2020 1458)  cefTRIAXone (ROCEPHIN) 1 g in sodium chloride 0.9 % 100 mL IVPB (0 g Intravenous Stopped 12/07/2020 1354)  potassium chloride 10 mEq in 100 mL IVPB (10 mEq Intravenous New Bag/Given 11/29/2020 1620)  lactated ringers bolus 1,000 mL (0 mLs Intravenous Stopped 11/27/2020 1353)    And  lactated ringers bolus 500 mL (0 mLs Intravenous Stopped 11/18/2020 1439)    And  lactated ringers bolus 250 mL (0 mLs Intravenous Stopped 12/04/2020 1457)  potassium chloride 10 mEq in 100 mL IVPB (10 mEq Intravenous New  Bag/Given 03-Dec-2020 1444)    ED Course  I have reviewed the triage vital signs and the nursing notes.  Pertinent labs & imaging results that were available during my care of the patient were reviewed by me and considered in my medical decision making (see chart for details).    MDM Rules/Calculators/A&P                          Pt with general failure to thrive along with a one week history of diarrhea, after completing a course of keflex for uti.  Pending c dif at this time.  Pt with significant hypothermia, rectal 93.1 upon arrival - unclear etiology - lactate x 2 negative, blood cultures are pending.  TSH pending.  UA negative for source of infection.  He does have significant hypokalemia which is being replaced, magnesium level pending. Also now has a hemoglobin of 7.2, was 8.2 10 days ago.  He is hemoccult negative today. Type and screen sent, pt may also require prbc transfusion.    Discussed history of etoh - pt confirms he stopped drinking etoh more than a year ago, denies any of his sx being from recent etoh use.   Pt will require admission for further tx/management of sx.    Discussed with Dr. Thomes DinningAdefeso who accepts pt for admission.  Asked to order GI panel, and transfuse 2 units prbc's, orders placed.  Also, magnesium tx ordered.  Final Clinical Impression(s) / ED Diagnoses Final diagnoses:  Generalized weakness  Hypothermia, initial encounter  Hypokalemia   Diarrhea, unspecified type  Numbness  Hypotension, unspecified hypotension type  Anemia, unspecified type    Rx / DC Orders ED Discharge Orders    None       Victoriano Laindol, Roch Quach, PA-C 03-Dec-2020 Lenn [id number]    Jasen Hartstein, PA-C 03-Dec-2020 1837    Vanetta MuldersZackowski, Scott, MD 12/19/20 [phone]

## 2020-12-05 NOTE — H&P (Signed)
History and Physical  Stanley Scott JYN:[id number] DOB: 10-May-1951 DOA: 11/10/2020  Referring physician: Burgess Amor, PA-C PCP: Toma Deiters, MD  Patient coming from: Home  Chief Complaint: Generalized weakness  HPI: Stanley Scott is a 69 y.o. 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  Patient was unable to provide history, history was obtained from ED PA and ED medical record.  Per report, patient was reported to have had several episodes of loose to watery stools daily for the past week.  He has also had poor appetite, though family encouraged oral hydration.  Patient sustained a fall by landing on his outstretched left upper extremity while being transferred from his bed to the bedside commode (around 2 AM today).  He complained of left thumb, forearm and shoulder pain, but denies head injury. Patient was seen in the ED on 12/17 and was suspected to have acute cystitis without hematuria, he was discharged home with Keflex at that time.  Patient was reported to be sedentary at baseline and lives with son who provide significant assistance with ADLs.  He denies chest pain, shortness of breath, nausea, vomiting, abdominal pain.  ED Course: In the emergency department, temperature was 93.44F on arrival, BP 89/55, but other vital signs were within normal range.  Work-up in the ED showed normocytic anemia with H/H at 7.2/23.7, K+ 2.7, albumin 2.2, TSH was normal, lactic acid was negative, urinalysis was unimpressive for UTI.  Hemoccult was negative. Left shoulder, left forearm and left hand x-rays were negative for fracture.  Chest x-ray showed no acute cardiopulmonary disease.  Bair hugger was provided with improvement in body temperature.  IV hydration was provided, potassium  was replenished and magnesium was given.  Type and screen was done with plan to transfuse 2 units of blood in the ED due to symptomatic anemia.  Hospitalist was asked to admit patient for further evaluation and management.  Review of Systems: Constitutional: Negative for chills and fever.  HENT: Negative for ear pain and sore throat.   Eyes: Negative for pain and visual disturbance.  Respiratory: Negative for cough, chest tightness and shortness of breath.   Cardiovascular: Negative for chest pain and palpitations.  Gastrointestinal: Positive for diarrhea.  Negative for abdominal pain and vomiting.  Endocrine: Negative for polyphagia and polyuria.  Genitourinary: Negative for decreased urine volume, dysuria, enuresis, hematuria, vaginal discharge and vaginal pain.  Musculoskeletal: Negative for arthralgias and back pain.  Skin: Negative for color change and rash.  Allergic/Immunologic: Negative for immunocompromised state.  Neurological: Positive for weakness.  Negative for tremors, syncope, speech difficulty, light-headedness and headaches.  Hematological: Does not bruise/bleed easily.  All other systems reviewed and are negative   History reviewed. No pertinent past medical history. History reviewed. No pertinent surgical history.  Social History:  reports that he has never smoked. He has never used smokeless tobacco. He reports previous alcohol use. He reports that he does not use drugs.   Not on File  History reviewed. No pertinent family history.   Prior to Admission medications   Medication Sig Start Date End Date Taking? Authorizing Provider  acetaminophen (TYLENOL) 500 MG tablet Take 500 mg by mouth every 6 (six) hours as needed for moderate pain or headache.   Yes [provider]  atorvastatin (LIPITOR) 40 MG tablet Take 40 mg  by mouth daily. 10/08/20  Yes [provider]  cephALEXin (KEFLEX) 500 MG capsule Take 1 capsule (500 mg total) by mouth 4 (four)  times daily. 11/25/20  Yes Vanetta Mulders, MD  ergocalciferol (VITAMIN D2) 1.25 MG (50000 UT) capsule Take 50,000 Units by mouth once a week. 10/08/20  Yes [provider]  folic acid (FOLVITE) 1 MG tablet Take 1 mg by mouth daily. 10/08/20  Yes [provider]  metoprolol tartrate (LOPRESSOR) 50 MG tablet Take 50 mg by mouth 2 (two) times daily. 11/30/20  Yes [provider]  pantoprazole (PROTONIX) 40 MG tablet Take 40 mg by mouth daily. 10/08/20  Yes [provider]  sodium chloride 1 g tablet Take 1 g by mouth 2 (two) times daily with a meal. 10/08/20  Yes [provider]  thiamine 100 MG tablet Take 100 mg by mouth daily. Patient not taking: No sig reported 10/08/20   [provider]    Physical Exam: BP 98/63 (BP Location: Right Arm)   Pulse 92   Temp 97.6 F (36.4 C) (Oral)   Resp 14   SpO2 100%   . General: 70 y.o. year-old male well developed well nourished in no acute distress.  Alert and oriented x3. Marland Kitchen HEENT: NCAT, EOMI . Neck: Supple, trachea medial . Cardiovascular: Tachycardia.  Regular rate and rhythm with no rubs or gallops.  No thyromegaly or JVD noted.  No lower extremity edema. 2/4 pulses in all 4 extremities. Marland Kitchen Respiratory: Clear to auscultation with no wheezes or rales. Good inspiratory effort. . Abdomen: Soft nontender nondistended with normal bowel sounds x4 quadrants. . Muskuloskeletal: No cyanosis, clubbing or edema noted bilaterally . Neuro: CN II-XII intact, strength, sensation, reflexes . Skin: No ulcerative lesions noted or rashes . Psychiatry: Mood is appropriate for condition and setting          Labs on Admission:  Basic Metabolic Panel: Recent Labs  Lab 25-Dec-2020 1217 Dec 25, 2020 1554  NA 135  --   K 2.7*  --   CL 100  --   CO2 21*  --   GLUCOSE 99  --   BUN 15  --   CREATININE 0.66  --   CALCIUM 8.5*  --   MG  --  1.7   Liver Function Tests: Recent Labs  Lab 12/25/20 1217  AST 24   ALT 9  ALKPHOS 118  BILITOT 0.9  PROT 6.7  ALBUMIN 2.2*   No results for input(s): LIPASE, AMYLASE in the last 168 hours. No results for input(s): AMMONIA in the last 168 hours. CBC: Recent Labs  Lab 2020/12/25 1217  WBC 7.2  NEUTROABS 5.6  HGB 7.2*  HCT 23.7*  MCV 93.7  PLT 271   Cardiac Enzymes: No results for input(s): CKTOTAL, CKMB, CKMBINDEX, TROPONINI in the last 168 hours.  BNP (last 3 results) No results for input(s): BNP in the last 8760 hours.  ProBNP (last 3 results) No results for input(s): PROBNP in the last 8760 hours.  CBG: No results for input(s): GLUCAP in the last 168 hours.  Radiological Exams on Admission: DG Forearm Left  Result Date: 2020/12/25 CLINICAL DATA:  Fall EXAM: LEFT FOREARM - 2 VIEW COMPARISON:  None. FINDINGS: There is no evidence of fracture or other focal bone lesions. Soft tissues are unremarkable. Degenerative change in the radiocarpal joint. IMPRESSION: Negative for fracture. Electronically Signed   By: Marlan Palau M.D.   On: 12-25-20 16:25   DG Chest [facility]  Result Date: 12/25/2020 CLINICAL DATA:  Questionable sepsis. EXAM: PORTABLE CHEST 1 VIEW COMPARISON:  December 17, 21. FINDINGS: The heart size and mediastinal contours are within normal limits. Aortic atherosclerosis. No consolidation. Similar mild right basilar atelectasis/scar. No visible pleural effusions or pneumothorax. Right-sided skin fold. No acute osseous abnormality. EKG leads project over the chest. IMPRESSION: No acute cardiopulmonary disease. Electronically Signed   By: Feliberto Harts MD   On: 12/25/2020 13:23   DG Shoulder Left  Result Date: 12-25-20 CLINICAL DATA:  Fall EXAM: LEFT SHOULDER - 2+ VIEW COMPARISON:  None. FINDINGS: There is no evidence of fracture or dislocation. There is no evidence of arthropathy or other focal bone abnormality. Soft tissues are unremarkable. IMPRESSION: Negative. Electronically Signed   By: Marlan Palau M.D.   On:  Dec 25, 2020 16:26   DG Hand Complete Left  Result Date: 12-25-2020 CLINICAL DATA:  Fall EXAM: LEFT HAND - COMPLETE 3+ VIEW COMPARISON:  None. FINDINGS: Negative for acute fracture Moderate degenerative change in the radiocarpal joint with joint space narrowing and spurring. No erosion identified. IMPRESSION: Negative for fracture. Electronically Signed   By: Marlan Palau M.D.   On: 25-Dec-2020 16:24    EKG: I independently viewed the EKG done and my findings are as followed: Sinus tachycardia with occasional PVCs at a rate of 103 bpm, RBBB, QTc 605 ms  Assessment/Plan Present on Admission: . Acute diarrhea  Principal Problem:   Acute diarrhea Active Problems:   Generalized weakness   Hypothermia   Failure to thrive in adult   Hypotension   Symptomatic anemia   Hypoalbuminemia   Prolonged QT interval   Gastric AVM   CVA (cerebral vascular accident) (HCC)   SIRS (systemic inflammatory response syndrome) (HCC)   Hypokalemia   Essential hypertension   Generalized weakness possibly secondary to multifactorial including symptomatic anemia/ acute diarrhea/failure to thrive in adult IV hydration was provided in the ED Continue fall precaution and neurochecks  Acute diarrhea Patient was reported to have had several episodes of diarrhea daily for the past week He was recently treated with Keflex due to acute cystitis, it is unknown at this time if patient's diarrhea was related to antibiotic induced C. difficile (C. difficile pending). Antibiotic treatment pending C. difficile and GI panel Continue IV hydration  Symptomatic anemia H/H 7.2/33.7 (this was 8.2/26.1 about 10 days ago) Hemoccult was negative Type and screen was done with plan to transfuse patient with 2 units of blood due to being symptomatic Continue to H/H  Failure to thrive in adult/Hypoalbuminemia possibly secondary to moderate protein calorie malnutrition SIRS Patient was hypothermic and tachycardic; it is  uncertain if this is infectious related at this time, considering similar symptoms could be presented with symptomatic anemia Continue Ensure Dietitian will be consulted  Hypotension This improved with IV hydration Continue IV hydration  Hypothermia-resolved Bair hugger was provided TSH was normal  Accidental fall Continue fall precaution and neurochecks Continue PT/OT eval and treat  Prolonged QTc in the setting of hypokalemia K+ 2.7; this was replenished Magnesium was 1.7; magnesium was provided Avoid QT prolonging drugs Continue telemetry  Gastric AVM Continue Protonix  History of CVA Continue statin Patient was not on any antiplatelet/anticoagulant  Essential hypertension Metoprolol will be held at this time due to soft BP  DVT prophylaxis: SCDs; consider starting chemoprophylaxis after blood transfusion and no sign of bleed  Code Status: Full code  Family Communication: None at bedside  Disposition Plan:  Patient is from:  home Anticipated DC to:                   SNF or family members home Anticipated DC date:               2-3 days Anticipated DC barriers:           Patient is unstable to be discharged at this time due to generalized weakness in setting of diarrhea, symptomatic anemia requiring blood transfusion and inpatient treatment  Consults called: None  Admission status: Inpatient    Frankey Shownladapo Lindzee Gouge MD [facility]   01-22-2020, 10:28 PM

## 2020-12-06 DIAGNOSIS — I10 Essential (primary) hypertension: Secondary | ICD-10-CM | POA: Diagnosis not present

## 2020-12-06 DIAGNOSIS — R197 Diarrhea, unspecified: Secondary | ICD-10-CM | POA: Diagnosis not present

## 2020-12-06 DIAGNOSIS — E8809 Other disorders of plasma-protein metabolism, not elsewhere classified: Secondary | ICD-10-CM | POA: Diagnosis not present

## 2020-12-06 DIAGNOSIS — K31819 Angiodysplasia of stomach and duodenum without bleeding: Secondary | ICD-10-CM | POA: Diagnosis not present

## 2020-12-06 LAB — CBC
HCT: 27.2 % — ABNORMAL LOW (ref 39.0–52.0)
Hemoglobin: 8.6 g/dL — ABNORMAL LOW (ref 13.0–17.0)
MCH: 28.5 pg (ref 26.0–34.0)
MCHC: 31.6 g/dL (ref 30.0–36.0)
MCV: 90.1 fL (ref 80.0–100.0)
Platelets: 242 10*3/uL (ref 150–400)
RBC: 3.02 MIL/uL — ABNORMAL LOW (ref 4.22–5.81)
RDW: 20.2 % — ABNORMAL HIGH (ref 11.5–15.5)
WBC: 9 10*3/uL (ref 4.0–10.5)
nRBC: 0 % (ref 0.0–0.2)

## 2020-12-06 LAB — COMPREHENSIVE METABOLIC PANEL
ALT: 9 U/L (ref 0–44)
AST: 21 U/L (ref 15–41)
Albumin: 1.8 g/dL — ABNORMAL LOW (ref 3.5–5.0)
Alkaline Phosphatase: 100 U/L (ref 38–126)
Anion gap: 12 (ref 5–15)
BUN: 9 mg/dL (ref 8–23)
CO2: 21 mmol/L — ABNORMAL LOW (ref 22–32)
Calcium: 7.9 mg/dL — ABNORMAL LOW (ref 8.9–10.3)
Chloride: 103 mmol/L (ref 98–111)
Creatinine, Ser: 0.48 mg/dL — ABNORMAL LOW (ref 0.61–1.24)
GFR, Estimated: >60 mL/min (ref >60)
Glucose, Bld: 70 mg/dL (ref 70–99)
Potassium: 3.5 mmol/L (ref 3.5–5.1)
Sodium: 136 mmol/L (ref 135–145)
Total Bilirubin: 0.8 mg/dL (ref 0.3–1.2)
Total Protein: 5.7 g/dL — ABNORMAL LOW (ref 6.5–8.1)

## 2020-12-06 LAB — PHOSPHORUS: Phosphorus: 4.3 mg/dL (ref 2.5–4.6)

## 2020-12-06 LAB — APTT: aPTT: 48 seconds — ABNORMAL HIGH (ref 24–36)

## 2020-12-06 LAB — CBG MONITORING, ED: Glucose-Capillary: 106 mg/dL — ABNORMAL HIGH (ref 70–99)

## 2020-12-06 LAB — PROTIME-INR
INR: 1.2 (ref 0.8–1.2)
Prothrombin Time: 14.6 seconds (ref 11.4–15.2)

## 2020-12-06 LAB — MAGNESIUM: Magnesium: 1.9 mg/dL (ref 1.7–2.4)

## 2020-12-06 LAB — C DIFFICILE QUICK SCREEN W PCR REFLEX
C Diff antigen: POSITIVE — AB
C Diff interpretation: DETECTED
C Diff toxin: POSITIVE — AB

## 2020-12-06 MED ORDER — ASPIRIN EC 81 MG PO TBEC
81.0000 mg | DELAYED_RELEASE_TABLET | Freq: Every day | ORAL | Status: DC
  Administered 2020-12-06 – 2020-12-23 (×17): 81 mg via ORAL
  Filled 2020-12-06 (×18): qty 1

## 2020-12-06 MED ORDER — METOPROLOL TARTRATE 25 MG PO TABS
25.0000 mg | ORAL_TABLET | Freq: Two times a day (BID) | ORAL | Status: DC
  Administered 2020-12-06 – 2020-12-09 (×7): 25 mg via ORAL
  Filled 2020-12-06 (×9): qty 1

## 2020-12-06 MED ORDER — CHLORHEXIDINE GLUCONATE CLOTH 2 % EX PADS
6.0000 | MEDICATED_PAD | Freq: Every day | CUTANEOUS | Status: DC
  Administered 2020-12-06 – 2020-12-28 (×22): 6 via TOPICAL

## 2020-12-06 MED ORDER — ACETAMINOPHEN 325 MG PO TABS
650.0000 mg | ORAL_TABLET | Freq: Four times a day (QID) | ORAL | Status: DC | PRN
  Administered 2020-12-15 – 2020-12-23 (×5): 650 mg via ORAL
  Filled 2020-12-06 (×5): qty 2

## 2020-12-06 MED ORDER — ONDANSETRON HCL 4 MG/2ML IJ SOLN
4.0000 mg | Freq: Four times a day (QID) | INTRAMUSCULAR | Status: DC | PRN
  Filled 2020-12-06: qty 2

## 2020-12-06 MED ORDER — POTASSIUM CHLORIDE CRYS ER 20 MEQ PO TBCR
40.0000 meq | EXTENDED_RELEASE_TABLET | Freq: Once | ORAL | Status: AC
  Administered 2020-12-06: 23:00:00 40 meq via ORAL
  Filled 2020-12-06: qty 2

## 2020-12-06 MED ORDER — VANCOMYCIN 50 MG/ML ORAL SOLUTION
125.0000 mg | Freq: Four times a day (QID) | ORAL | Status: AC
  Administered 2020-12-06 – 2020-12-16 (×35): 125 mg via ORAL
  Filled 2020-12-06 (×51): qty 2.5

## 2020-12-06 NOTE — Evaluation (Signed)
Physical Therapy Evaluation Patient Details Name: Stanley Scott MRN: [id number] DOB: 05/01/1951 Today's Date: 12/06/2020   History of Present Illness  Stanley Scott is a 69 y.o. 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  Patient was unable to provide history, history was obtained from ED PA and ED medical record.  Per report, patient was reported to have had several episodes of loose to watery stools daily for the past week.  He has also had poor appetite, though family encouraged oral hydration.  Patient sustained a fall by landing on his outstretched left upper extremity while being transferred from his bed to the bedside commode (around 2 AM today).  He complained of left thumb, forearm and shoulder pain, but denies head injury.  Patient was seen in the ED on 12/17 and was suspected to have acute cystitis without hematuria, he was discharged home with Keflex at that time.   Patient was reported to be sedentary at baseline and lives with son who provide significant assistance with ADLs.  He denies chest pain, shortness of breath, nausea, vomiting, abdominal pain.    Clinical Impression  Patient demonstrates slow labored movement for sitting up at bedside with c/o severe pain with pressure to BLE and generalized pain all over, unable to maintain sitting balance with frequent falling backwards, unable to maintain standing balance due to BLE giving way and generalized weakness.  Patient put back to be with Max assist to reposition.  Patient will benefit from continued physical therapy in hospital and recommended venue below to increase strength, balance, endurance for safe ADLs and gait.     Follow Up Recommendations SNF    Equipment Recommendations  None recommended by PT     Recommendations for Other Services       Precautions / Restrictions Precautions Precautions: Fall Restrictions Weight Bearing Restrictions: No      Mobility  Bed Mobility Overal bed mobility: Needs Assistance Bed Mobility: Supine to Sit;Sit to Supine     Supine to sit: Max assist Sit to supine: Max assist   General bed mobility comments: slow labored movement with c/o severe pain with pressure to BLE    Transfers Overall transfer level: Needs assistance Equipment used: 1 person hand held assist Transfers: Sit to/from Stand Sit to Stand: Max assist         General transfer comment: unable to support body weight due to knees buckling/weakness  Ambulation/Gait                Stairs            Wheelchair Mobility    Modified Rankin (Stroke Patients Only)       Balance                                             Pertinent Vitals/Pain Pain Assessment: Faces Faces Pain Scale: Hurts even more Pain Location: all over body Pain Descriptors / Indicators: Aching;Sore;Grimacing;Guarding Pain Intervention(s): Limited activity within patient's tolerance;Monitored during session;Repositioned    Home Living Family/patient expects to be discharged to:: Private residence Living Arrangements: Children Available Help at Discharge: Family;Available 24 hours/day Type of Home: Apartment Home Access: Stairs to enter Entrance Stairs-Rails: Right;Left;Can reach both  Entrance Stairs-Number of Steps: 6 Home Layout: One level Home Equipment: Walker - 2 wheels;Wheelchair - [employer];Shower seat;Grab bars - tub/shower      Prior Function Level of Independence: Independent with assistive device(s)         Comments: household ambulator using RW     Hand Dominance   Dominant Hand: Right    Extremity/Trunk Assessment   Upper Extremity Assessment Upper Extremity Assessment: Defer to OT evaluation    Lower Extremity  Assessment Lower Extremity Assessment: Generalized weakness    Cervical / Trunk Assessment Cervical / Trunk Assessment: Kyphotic  Communication   Communication: No difficulties  Cognition Arousal/Alertness: Awake/alert Behavior During Therapy: WFL for tasks assessed/performed Overall Cognitive Status: Within Functional Limits for tasks assessed                                        General Comments      Exercises     Assessment/Plan    PT Assessment Patient needs continued PT services  PT Problem List Decreased strength;Decreased activity tolerance;Decreased balance;Decreased mobility       PT Treatment Interventions DME instruction;Gait training;Stair training;Functional mobility training;Therapeutic activities;Therapeutic exercise;Balance training;Patient/family education    PT Goals (Current goals can be found in the Care Plan section)  Acute Rehab PT Goals Patient Stated Goal: Return home after rehab PT Goal Formulation: With patient Time For Goal Achievement: 12/20/20 Potential to Achieve Goals: Good    Frequency Min 3X/week   Barriers to discharge        Co-evaluation               AM-PAC PT "6 Clicks" Mobility  Outcome Measure Help needed turning from your back to your side while in a flat bed without using bedrails?: A Lot Help needed moving from lying on your back to sitting on the side of a flat bed without using bedrails?: A Lot Help needed moving to and from a bed to a chair (including a wheelchair)?: Total Help needed standing up from a chair using your arms (e.g., wheelchair or bedside chair)?: Total Help needed to walk in hospital room?: Total Help needed climbing 3-5 steps with a railing? : Total 6 Click Score: 8    End of Session   Activity Tolerance: Patient tolerated treatment well;Patient limited by fatigue Patient left: in bed;with call bell/phone within reach Nurse Communication: Mobility status PT Visit  Diagnosis: Unsteadiness on feet (R26.81);Other abnormalities of gait and mobility (R26.89);Muscle weakness (generalized) (M62.81)    Time: 1030-1059 PT Time Calculation (min) (ACUTE ONLY): 29 min   Charges:   PT Evaluation $PT Eval Moderate Complexity: 1 Mod PT Treatments $Therapeutic Activity: 23-37 mins        11:25 AM, 12/06/20 Ocie Bob, MPT Physical Therapist with [facility] [phone] office [phone] mobile phone

## 2020-12-06 NOTE — ED Notes (Signed)
Tray of food taken into pt's room. Pt states he is not hungry right now. Pt given diet ginger-ale per request.

## 2020-12-06 NOTE — ED Notes (Signed)
Fullbody bair hugger applied and temp turned up to high

## 2020-12-06 NOTE — ED Notes (Addendum)
Bair hugger turned back on d/t pt temp dropping to 96 degrees.

## 2020-12-06 NOTE — ED Notes (Signed)
Bair hugger reapplied at low temp

## 2020-12-06 NOTE — Plan of Care (Signed)
  Problem: Acute Rehab PT Goals(only PT should resolve) Goal: Pt Will Go Supine/Side To Sit Outcome: Progressing Flowsheets (Taken 12/06/2020 1127) Pt will go Supine/Side to Sit: with moderate assist Goal: Patient Will Transfer Sit To/From Stand Outcome: Progressing Flowsheets (Taken 12/06/2020 1127) Patient will transfer sit to/from stand: with moderate assist Goal: Pt Will Transfer Bed To Chair/Chair To Bed Outcome: Progressing Flowsheets (Taken 12/06/2020 1127) Pt will Transfer Bed to Chair/Chair to Bed: with mod assist Goal: Pt Will Ambulate Outcome: Progressing Flowsheets (Taken 12/06/2020 1127) Pt will Ambulate:  10 feet  with moderate assist  with rolling walker   11:27 AM, 12/06/20 Ocie Bob, MPT Physical Therapist with [facility] [phone] office [phone] mobile phone

## 2020-12-06 NOTE — ED Notes (Signed)
Attempted to call report to [phone]

## 2020-12-06 NOTE — ED Notes (Addendum)
Bair hugger turned off at this time d/t pt temp of 99.6

## 2020-12-06 NOTE — ED Notes (Signed)
Pt given breakfast tray. Pt sat up with assistance and pt states he can feed himself. Pt notified to use call light if he needs help eating.

## 2020-12-06 NOTE — ED Notes (Signed)
Son received a update

## 2020-12-06 NOTE — Progress Notes (Signed)
PROGRESS NOTE    Stanley Scott  DEY:[id number] DOB: 09-20-1951 DOA: 12-10-2020 PCP: Toma Deiters, MD    Brief Narrative:  69 year old male with a history of left MCA infarct in 09/2020, left carotid stenosis status post carotid endarterectomy in 10/2020, hypertension, history of GI bleed from gastric AVM, is admitted to the hospital with generalized weakness.  He was reportedly having loose stools for the past week.  He was noted to be severely hypokalemic, anemic and hypothermic on arrival.  He also had low blood pressures.  Hemodynamics responded to IV fluid hydration.  Stool tested positive for C. difficile.  He was started on oral vancomycin.  Suspect that he will need skilled nursing facility placement.   Assessment & Plan:   Principal Problem:   Acute diarrhea Active Problems:   Generalized weakness   Hypothermia   Failure to thrive in adult   Hypotension   Symptomatic anemia   Hypoalbuminemia   Prolonged QT interval   Gastric AVM   CVA (cerebral vascular accident) (HCC)   SIRS (systemic inflammatory response syndrome) (HCC)   Hypokalemia   Essential hypertension   C. difficile diarrhea -Patient having significant diarrhea prior to admission -C. difficile positive -Started on oral vancomycin -Monitor frequency of stools  Generalized weakness -Likely related to decreased p.o. intake, diarrheal illness and electrolyte abnormalities -Patient had a fall prior to admission -Physical therapy evaluation  Anemia -Noted to have a hemoglobin of 7.2 -Hemoccult negative -He was transfused 2 units of PRBC on admission -Follow-up hemoglobin improved to 8.6  Hypokalemia/hypomagnesemia -Likely related to diarrhea and poor p.o. intake -Replaced  History of gastric AVM -Currently Hemoccult negative -Holding Protonix in light of C. Difficile  History of left MCA CVA -Secondary to left carotid stenosis -He is status post left carotid endarterectomy in  10/2020 -Continue on aspirin and statin  Failure to thrive/severe protein calorie malnutrition -Albumin of 1.8 -Nutrition consult  Hypertension -Resume on reduced dose of metoprolol   DVT prophylaxis: SCDs Start: 12/10/2020 1913  Code Status: Full code Family Communication: Unable to reach her son over the phone Disposition Plan: Status is: Inpatient  Remains inpatient appropriate because:Inpatient level of care appropriate due to severity of illness   Dispo: The patient is from: Home              Anticipated d/c is to: SNF              Anticipated d/c date is: 2 days              Patient currently is not medically stable to d/c.    Consultants:     Procedures:     Antimicrobials:   Oral vancomycin 12/28 >   Subjective: Reports that he has had 1 loose stool.  Feels generally weak.  Normally ambulates with a walker.  Objective: Vitals:   12/06/20 1430 12/06/20 1445 12/06/20 1500 12/06/20 1706  BP:   117/65 120/74  Pulse: (!) 105 (!) 105 (!) 106 (!) 108  Resp: (!) 21 (!) 21 19 (!) 26  Temp: 98.8 F (37.1 C) 99.1 F (37.3 C) 99.3 F (37.4 C) 98.2 F (36.8 C)  TempSrc:    Oral  SpO2: 94% 95% 95% 98%    Intake/Output Summary (Last 24 hours) at 12/06/2020 1836 Last data filed at 12/06/2020 1605 Gross per 24 hour  Intake 3617.15 ml  Output 1 ml  Net 3616.15 ml   There were no vitals filed for this visit.  Examination:  General  exam: Appears calm and comfortable  Respiratory system: Clear to auscultation. Respiratory effort normal. Cardiovascular system: S1 & S2 heard, RRR. No JVD, murmurs, rubs, gallops or clicks. No pedal edema. Gastrointestinal system: Abdomen is nondistended, soft and nontender. No organomegaly or masses felt. Normal bowel sounds heard. Central nervous system: Alert and oriented. Patient has some dysarthria.  He has symmetrical weakness in upper and lower extremities Extremities: Symmetric 2/3 power bilaterally. Skin: No rashes,  lesions or ulcers Psychiatry: Judgement and insight appear normal. Mood & affect appropriate.     Data Reviewed: I have personally reviewed following labs and imaging studies  CBC: Recent Labs  Lab 12/07/2020 1217 12/06/20 0405  WBC 7.2 9.0  NEUTROABS 5.6  --   HGB 7.2* 8.6*  HCT 23.7* 27.2*  MCV 93.7 90.1  PLT 271 242   Basic Metabolic Panel: Recent Labs  Lab 12/07/2020 1217 11/17/2020 1554 12/06/20 0405  NA 135  --  136  K 2.7*  --  3.5  CL 100  --  103  CO2 21*  --  21*  GLUCOSE 99  --  70  BUN 15  --  9  CREATININE 0.66  --  0.48*  CALCIUM 8.5*  --  7.9*  MG  --  1.7 1.9  PHOS  --   --  4.3   GFR: Estimated Creatinine Clearance: 65.1 mL/min (A) (by C-G formula based on SCr of 0.48 mg/dL (L)). Liver Function Tests: Recent Labs  Lab 11/16/2020 1217 12/06/20 0405  AST 24 21  ALT 9 9  ALKPHOS 118 100  BILITOT 0.9 0.8  PROT 6.7 5.7*  ALBUMIN 2.2* 1.8*   No results for input(s): LIPASE, AMYLASE in the last 168 hours. No results for input(s): AMMONIA in the last 168 hours. Coagulation Profile: Recent Labs  Lab 11/25/2020 1217 12/06/20 0405  INR 1.2 1.2   Cardiac Enzymes: No results for input(s): CKTOTAL, CKMB, CKMBINDEX, TROPONINI in the last 168 hours. BNP (last 3 results) No results for input(s): PROBNP in the last 8760 hours. HbA1C: No results for input(s): HGBA1C in the last 72 hours. CBG: Recent Labs  Lab 12/06/20 1259  GLUCAP 106*   Lipid Profile: No results for input(s): CHOL, HDL, LDLCALC, TRIG, CHOLHDL, LDLDIRECT in the last 72 hours. Thyroid Function Tests: Recent Labs    11/12/2020 1636  TSH 4.426   Anemia Panel: No results for input(s): VITAMINB12, FOLATE, FERRITIN, TIBC, IRON, RETICCTPCT in the last 72 hours. Sepsis Labs: Recent Labs  Lab 11/19/2020 1217 11/24/2020 1521  LATICACIDVEN 1.0 1.0    Recent Results (from the past 240 hour(s))  Blood Culture (routine x 2)     Status: None (Preliminary result)   Collection Time: 11/28/2020  12:17 PM   Specimen: BLOOD  Result Value Ref Range Status   Specimen Description BLOOD BLOOD RIGHT FOREARM  Final   Special Requests   Final    BOTTLES DRAWN AEROBIC AND ANAEROBIC Blood Culture adequate volume   Culture   Final    NO GROWTH < 24 HOURS Performed at [facility], [address], [place], [place] [address]    Report Status PENDING  Incomplete  Blood Culture (routine x 2)     Status: None (Preliminary result)   Collection Time: 11/26/2020 12:45 PM   Specimen: BLOOD  Result Value Ref Range Status   Specimen Description BLOOD LEFT ANTECUBITAL  Final   Special Requests   Final    BOTTLES DRAWN AEROBIC AND ANAEROBIC Blood Culture results may not  be optimal due to an inadequate volume of blood received in culture bottles   Culture   Final    NO GROWTH < 24 HOURS Performed at [facility], [address], [place], [place] [address]    Report Status PENDING  Incomplete  Resp Panel by RT-PCR (Flu A&B, Covid) Nasopharyngeal Swab     Status: None   Collection Time: 11/22/2020  2:12 PM   Specimen: Nasopharyngeal Swab; Nasopharyngeal(NP) swabs in vial transport medium  Result Value Ref Range Status   SARS Coronavirus 2 by RT PCR NEGATIVE NEGATIVE Final    Comment: (NOTE) SARS-CoV-2 target nucleic acids are NOT DETECTED.  The SARS-CoV-2 RNA is generally detectable in upper respiratory specimens during the acute phase of infection. The lowest concentration of SARS-CoV-2 viral copies this assay can detect is 138 copies/mL. A negative result does not preclude SARS-Cov-2 infection and should not be used as the sole basis for treatment or other patient management decisions. A negative result may occur with  improper specimen collection/handling, submission of specimen other than nasopharyngeal swab, presence of viral mutation(s) within the areas targeted by this assay, and inadequate number of viral copies(<138 copies/mL). A negative result must be combined with clinical observations,  patient history, and epidemiological information. The expected result is Negative.  Fact Sheet for Patients:  BloggerCourse.com  Fact Sheet for Healthcare Providers:  SeriousBroker.it  This test is no t yet approved or cleared by the [place] FDA and  has been authorized for detection and/or diagnosis of SARS-CoV-2 by FDA under an Emergency Use Authorization (EUA). This EUA will remain  in effect (meaning this test can be used) for the duration of the COVID-19 declaration under Section 564(b)(1) of the Act, 21 U.S.C.section 360bbb-3(b)(1), unless the authorization is terminated  or revoked sooner.       Influenza A by PCR NEGATIVE NEGATIVE Final   Influenza B by PCR NEGATIVE NEGATIVE Final    Comment: (NOTE) The Xpert Xpress SARS-CoV-2/FLU/RSV plus assay is intended as an aid in the diagnosis of influenza from Nasopharyngeal swab specimens and should not be used as a sole basis for treatment. Nasal washings and aspirates are unacceptable for Xpert Xpress SARS-CoV-2/FLU/RSV testing.  Fact Sheet for Patients: BloggerCourse.com  Fact Sheet for Healthcare Providers: SeriousBroker.it  This test is not yet approved or cleared by the [place] FDA and has been authorized for detection and/or diagnosis of SARS-CoV-2 by FDA under an Emergency Use Authorization (EUA). This EUA will remain in effect (meaning this test can be used) for the duration of the COVID-19 declaration under Section 564(b)(1) of the Act, 21 U.S.C. section 360bbb-3(b)(1), unless the authorization is terminated or revoked.  Performed at [facility], [address] [place], [place] [address]   C Difficile Quick Screen w PCR reflex     Status: Abnormal   Collection Time: 12/06/20  4:00 PM   Specimen: STOOL  Result Value Ref Range Status   C Diff antigen POSITIVE (A) NEGATIVE Final   C Diff toxin POSITIVE  (A) NEGATIVE Final    Comment: CRITICAL RESULT CALLED TO, READ BACK BY AND VERIFIED WITH: CARA LINGLER@1730  12/06/20 BY JONES,T    C Diff interpretation Toxin producing C. difficile detected.  Final    Comment: CRITICAL RESULT CALLED TO, READ BACK BY AND VERIFIED WITH: CARA LINGER @1730  12/06/20 BY JONES,T Performed at [facility], [address], [place], [place]          Radiology Studies: DG Forearm Left  Result  Date: 05-11-20 CLINICAL DATA:  Fall EXAM: LEFT FOREARM - 2 VIEW COMPARISON:  None. FINDINGS: There is no evidence of fracture or other focal bone lesions. Soft tissues are unremarkable. Degenerative change in the radiocarpal joint. IMPRESSION: Negative for fracture. Electronically Signed   By: Marlan Palauharles  Clark M.D.   On: 05-11-20 16:25   DG Chest Port 1 View  Result Date: 05-11-20 CLINICAL DATA:  Questionable sepsis. EXAM: PORTABLE CHEST 1 VIEW COMPARISON:  December 17, 21. FINDINGS: The heart size and mediastinal contours are within normal limits. Aortic atherosclerosis. No consolidation. Similar mild right basilar atelectasis/scar. No visible pleural effusions or pneumothorax. Right-sided skin fold. No acute osseous abnormality. EKG leads project over the chest. IMPRESSION: No acute cardiopulmonary disease. Electronically Signed   By: Feliberto HartsFrederick S Jones MD   On: 05-11-20 13:23   DG Shoulder Left  Result Date: 05-11-20 CLINICAL DATA:  Fall EXAM: LEFT SHOULDER - 2+ VIEW COMPARISON:  None. FINDINGS: There is no evidence of fracture or dislocation. There is no evidence of arthropathy or other focal bone abnormality. Soft tissues are unremarkable. IMPRESSION: Negative. Electronically Signed   By: Marlan Palauharles  Clark M.D.   On: 05-11-20 16:26   DG Hand Complete Left  Result Date: 05-11-20 CLINICAL DATA:  Fall EXAM: LEFT HAND - COMPLETE 3+ VIEW COMPARISON:  None. FINDINGS: Negative for acute fracture Moderate degenerative change in the radiocarpal joint with  joint space narrowing and spurring. No erosion identified. IMPRESSION: Negative for fracture. Electronically Signed   By: Marlan Palauharles  Clark M.D.   On: 05-11-20 16:24        Scheduled Meds: . atorvastatin  40 mg Oral Daily  . feeding supplement  237 mL Oral BID BM  . folic acid  1 mg Oral Daily  . potassium chloride  40 mEq Oral Once  . vancomycin  125 mg Oral QID   Continuous Infusions:   LOS: 1 day    Time spent: 35mins    Erick BlinksJehanzeb Minyon Billiter, MD [facility]   If 7PM-7AM, please contact night-coverage [email or web address]  12/06/2020, 6:36 PM

## 2020-12-07 ENCOUNTER — Inpatient Hospital Stay (HOSPITAL_COMMUNITY): Payer: Medicare Other

## 2020-12-07 DIAGNOSIS — R197 Diarrhea, unspecified: Secondary | ICD-10-CM | POA: Diagnosis not present

## 2020-12-07 DIAGNOSIS — E43 Unspecified severe protein-calorie malnutrition: Secondary | ICD-10-CM | POA: Diagnosis present

## 2020-12-07 DIAGNOSIS — L8990 Pressure ulcer of unspecified site, unspecified stage: Secondary | ICD-10-CM | POA: Diagnosis present

## 2020-12-07 LAB — VITAMIN B12: Vitamin B-12: 784 pg/mL (ref 180–914)

## 2020-12-07 LAB — BPAM RBC
Blood Product Expiration Date: 202202012359
Blood Product Expiration Date: 202202012359
ISSUE DATE / TIME: 202112272141
ISSUE DATE / TIME: 202112280033
Unit Type and Rh: 5100
Unit Type and Rh: 5100

## 2020-12-07 LAB — TYPE AND SCREEN
ABO/RH(D): O POS
Antibody Screen: NEGATIVE
Unit division: 0
Unit division: 0

## 2020-12-07 LAB — URINE CULTURE: Culture: NO GROWTH

## 2020-12-07 LAB — COMPREHENSIVE METABOLIC PANEL
ALT: 13 U/L (ref 0–44)
AST: 48 U/L — ABNORMAL HIGH (ref 15–41)
Albumin: 1.9 g/dL — ABNORMAL LOW (ref 3.5–5.0)
Alkaline Phosphatase: 100 U/L (ref 38–126)
Anion gap: 10 (ref 5–15)
BUN: 10 mg/dL (ref 8–23)
CO2: 21 mmol/L — ABNORMAL LOW (ref 22–32)
Calcium: 8.2 mg/dL — ABNORMAL LOW (ref 8.9–10.3)
Chloride: 105 mmol/L (ref 98–111)
Creatinine, Ser: 0.47 mg/dL — ABNORMAL LOW (ref 0.61–1.24)
GFR, Estimated: >60 mL/min (ref >60)
Glucose, Bld: 102 mg/dL — ABNORMAL HIGH (ref 70–99)
Potassium: 3.8 mmol/L (ref 3.5–5.1)
Sodium: 136 mmol/L (ref 135–145)
Total Bilirubin: 0.6 mg/dL (ref 0.3–1.2)
Total Protein: 6 g/dL — ABNORMAL LOW (ref 6.5–8.1)

## 2020-12-07 LAB — GLUCOSE, CAPILLARY
Glucose-Capillary: 107 mg/dL — ABNORMAL HIGH (ref 70–99)
Glucose-Capillary: 70 mg/dL (ref 70–99)
Glucose-Capillary: 77 mg/dL (ref 70–99)
Glucose-Capillary: 90 mg/dL (ref 70–99)

## 2020-12-07 LAB — PHOSPHORUS: Phosphorus: 4.4 mg/dL (ref 2.5–4.6)

## 2020-12-07 LAB — HIV ANTIBODY (ROUTINE TESTING W REFLEX): HIV Screen 4th Generation wRfx: NONREACTIVE

## 2020-12-07 LAB — MAGNESIUM: Magnesium: 1.9 mg/dL (ref 1.7–2.4)

## 2020-12-07 LAB — FOLATE: Folate: 17.8 ng/mL (ref >5.9)

## 2020-12-07 IMAGING — MR MR HEAD W/O CM
11 of 12 series · 40 of 48 positions shown · non-contrast
Comparison: CT head [DATE].  CT angio head and neck [DATE]

CLINICAL DATA: Acute neuro deficit.

EXAM:
MRI HEAD WITHOUT CONTRAST
TECHNIQUE: Multiplanar, multiecho pulse sequences of the brain and surrounding
structures were obtained without intravenous contrast.

[Series 5: DWI · axial · 4.0mm · 0.88mm/px · z∈[-31,+108]mm · 4 of 36 slices shown (1 of 6)]
[im 1/36]
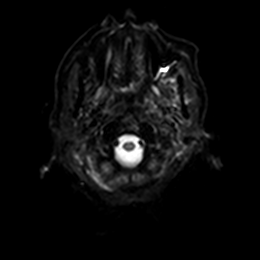
[im 12/36]
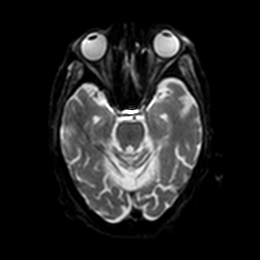
[im 24/36]
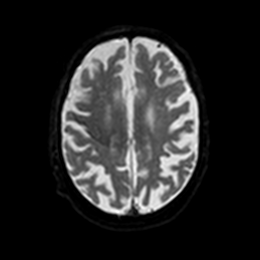
[im 36/36]
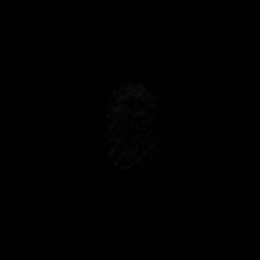

[Series 5: DWI · axial · 4.0mm · 0.88mm/px · z∈[-31,+108]mm · 4 of 36 slices shown (2 of 6)]
[im 1/36]
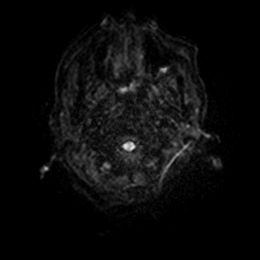
[im 12/36]
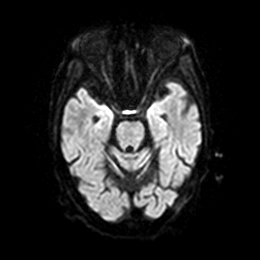
[im 24/36]
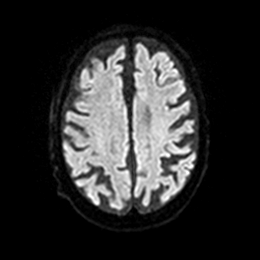
[im 36/36]
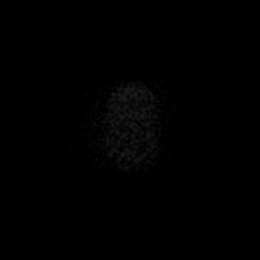

[Series 6: DWI · axial · 4.0mm · 0.88mm/px · z∈[-31,+108]mm · 5 of 36 slices shown (3 of 6)]
[im 1/36]
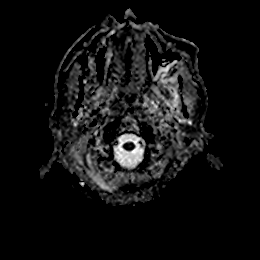
[im 9/36]
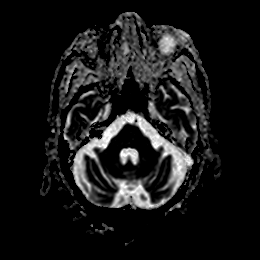
[im 18/36]
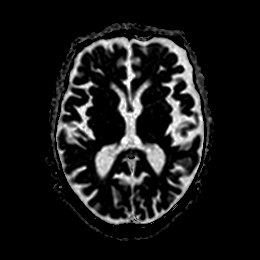
[im 27/36]
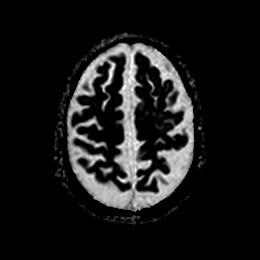
[im 36/36]
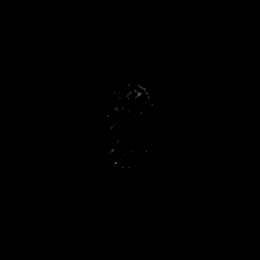

[Series 7: DWI · coronal · 5.0mm · 0.88mm/px · 4 of 28 slices shown (4 of 6)]
[im 1/28]
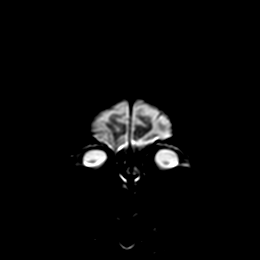
[im 10/28]
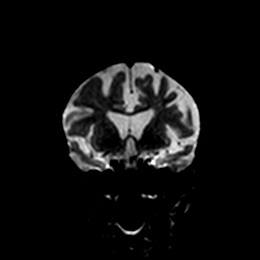
[im 19/28]
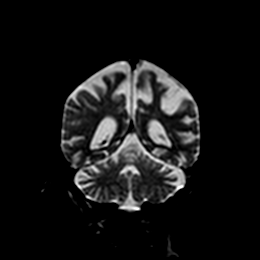
[im 28/28]
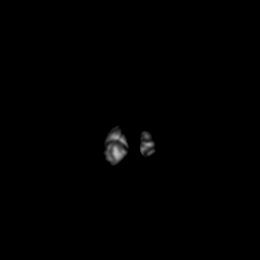

[Series 7: DWI · coronal · 5.0mm · 0.88mm/px · 4 of 28 slices shown (5 of 6)]
[im 1/28]
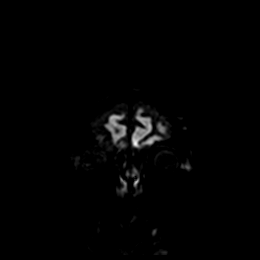
[im 10/28]
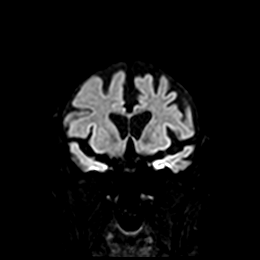
[im 19/28]
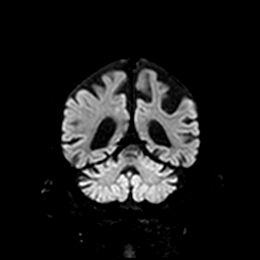
[im 28/28]
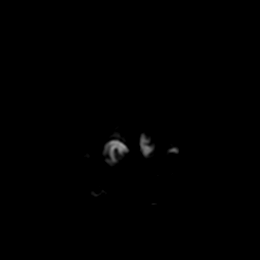

[Series 8: DWI · coronal · 5.0mm · 0.88mm/px · 4 of 28 slices shown (6 of 6)]
[im 1/28]
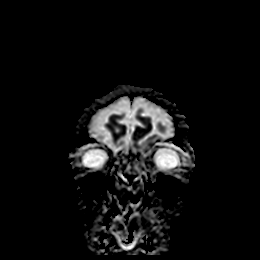
[im 10/28]
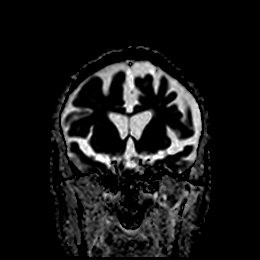
[im 19/28]
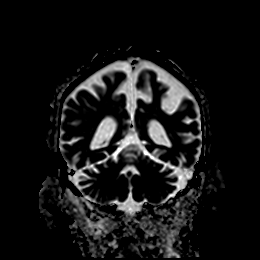
[im 28/28]
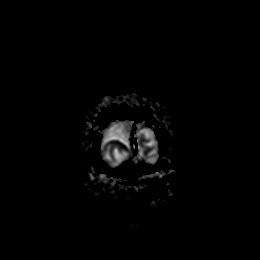

[Series 9: T1 · sagittal · 5.0mm · 0.94mm/px · 2 of 19 slices shown]
[im 1/19]
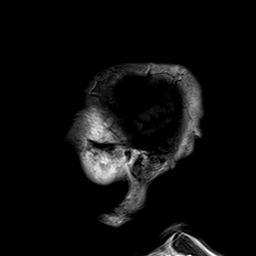
[im 19/19]
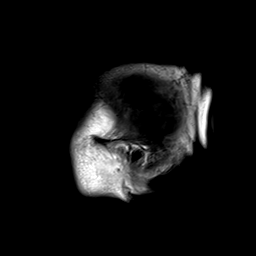

[Series 10: T2 · axial · 5.0mm · 0.72mm/px · z∈[-28,+105]mm · 3 of 20 slices shown (1 of 2)]
[im 1/20]
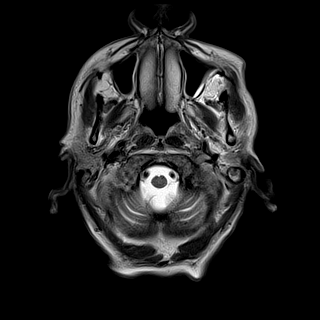
[im 10/20]
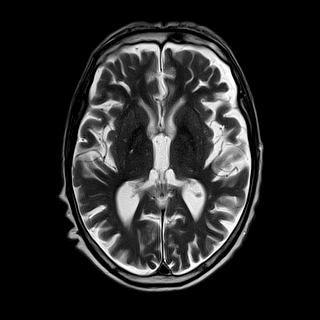
[im 20/20]
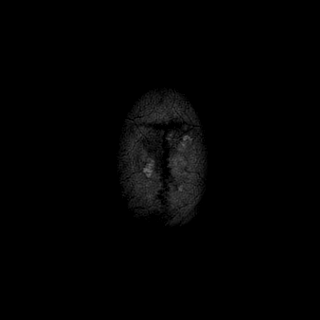

[Series 11: ax hemo · axial · 5.0mm · 0.86mm/px · z∈[-35,+108]mm · 3 of 25 slices shown]
[im 1/25]
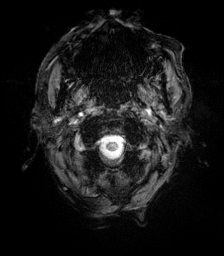
[im 13/25]
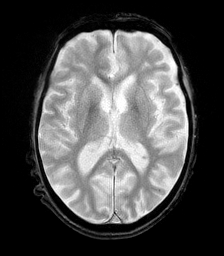
[im 25/25]
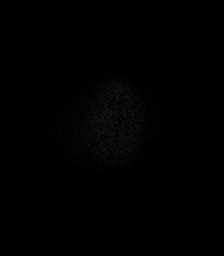

[Series 12: FLAIR · axial · 4.0mm · 0.43mm/px · z∈[-25,+98]mm · 4 of 32 slices shown]
[im 1/32]
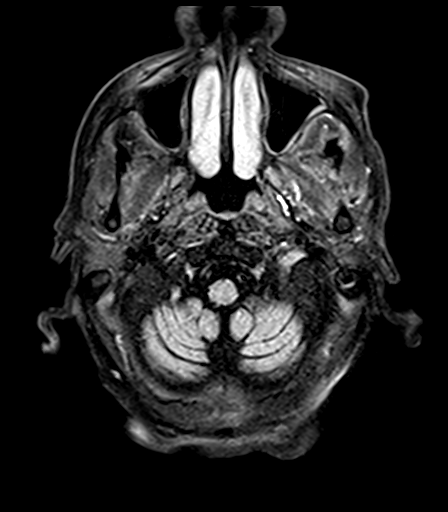
[im 11/32]
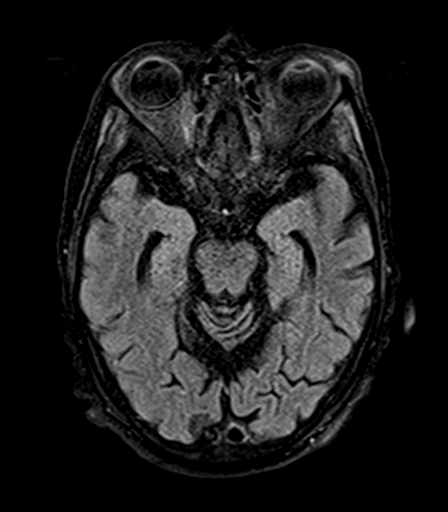
[im 21/32]
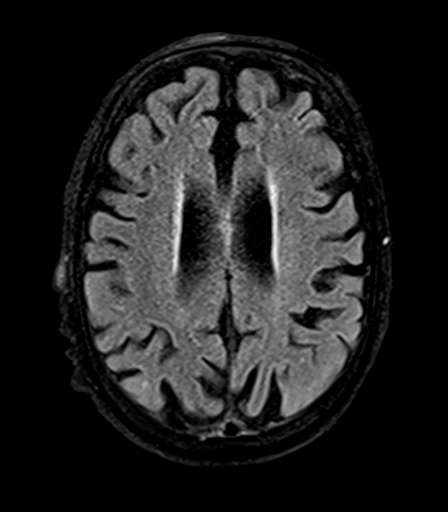
[im 32/32]
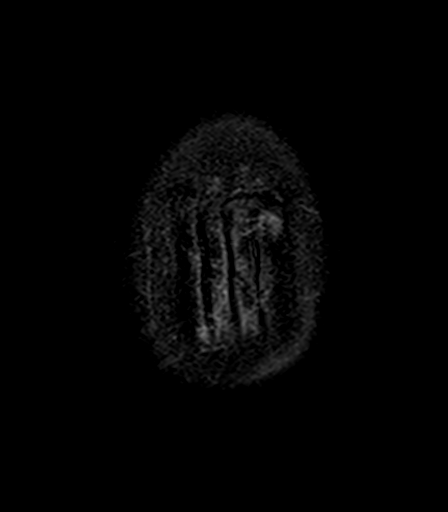

[Series 14: T2 · coronal · 5.0mm · 0.72mm/px · 3 of 26 slices shown (2 of 2)]
[im 1/26]
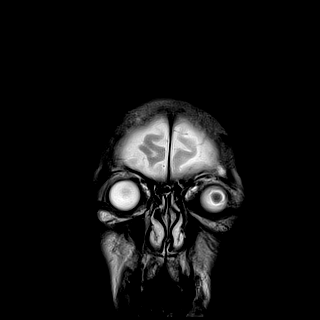
[im 13/26]
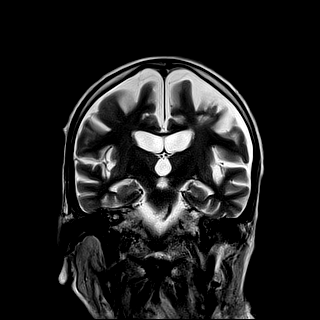
[im 26/26]
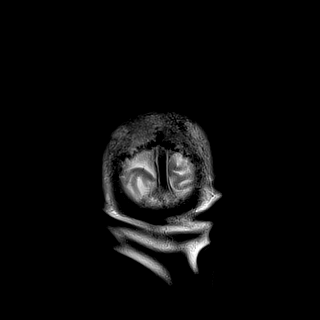

[40 of 48 positions shown; findings below may reference images not displayed]

FINDINGS: Brain: Negative for acute infarct. Mild white matter changes
bilaterally. Small chronic infarct left frontal lobe over the
convexity. Negative for hemorrhage or mass. Moderate atrophy without
hydrocephalus.

Vascular: Normal arterial flow voids

Skull and upper cervical spine: No focal skeletal lesion. Cervical
spondylosis with bony sclerosis as noted on the prior CTA neck.

Sinuses/Orbits: Mild mucosal edema paranasal sinuses. Negative
orbit.

Other: None
IMPRESSION: Moderate atrophy.  Small chronic infarct left frontal lobe.

Negative for acute infarct.

## 2020-12-07 NOTE — Progress Notes (Signed)
Initial Nutrition Assessment  DOCUMENTATION CODES:   Severe malnutrition in context of chronic illness  INTERVENTION:  Pending results of MBSS and if appropriate  Continue Ensure Enlive po BID, each supplement provides 350 kcal and 20 grams of protein  Magic cup BID with meals, each supplement provides 290 kcal and 9 grams of protein  1 packet Juven BID, each packet provides 95 calories, 2.5 grams of protein (collagen), and 9.8 grams of carbohydrate (3 grams sugar); also contains 7 grams of L-arginine and L-glutamine, 300 mg vitamin C, 15 mg vitamin E, 1.2 mcg vitamin B-12, 9.5 mg zinc, 200 mg calcium, and 1.5 g  Calcium Beta-hydroxy-Beta-methylbutyrate to support wound healing  MVI with minerals daily   No new weights this admission, will order daily weights  NUTRITION DIAGNOSIS:   Severe Malnutrition related to chronic illness as evidenced by moderate fat depletion,severe fat depletion,moderate muscle depletion,severe muscle depletion,percent weight loss.    GOAL:   Patient will meet greater than or equal to 90% of their needs    MONITOR:   PO intake,Weight trends,Labs,I & O's,Supplement acceptance,Skin  REASON FOR ASSESSMENT:   Consult    ASSESSMENT:  69 year old male admitted with acute diarrhea presented with multiple loose stools, electrolyte abnormalities, hypothermic, and low blood pressures. 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  Patient stool positive for C diff  Patient noted with increased bilateral weakness today, stat MRI ordered negative for acute stroke.  Patient resting quietly in bed this afternoon, reports diarrhea is better today. He recalls eating spaghetti and pudding for lunch, states his appetite is okay. Patient endorses difficulty with  chewing/swallowing some foods due to bad teeth. Reports chewing "hurts" sometimes" Speech has seen patient, currently NPO pending MBSS.  He endorses weight loss s/p stroke with bilateral weakness, right side worse than left. Recalls usual weight ~145 lbs a couple of years ago. Limited weight history for review, on 09/28/20 pt weighed 57 kg (125.4 lbs), on 10/18/20 weights decreased to 54.3 kg (119.46 lbs) and admit weight on 11/25/20 was 52.8 kg (116.16 lbs). This indicates ~9 lb (7.4%) in the last 6 weeks which is significant. Given trends as well as moderate/severe fat and muscle depletions noted on exam, pt meets criteria for severe malnutrition. He is drinking Ensure twice daily, reports that he likes them. Pending MBSS, if appropriate will order Magic Cup on meal trays to aid with meeting needs. Will also order Juven to support healing of stage II pressure injury present on admission.   I/Os: +5566.2 ml since admit UOP: 100 ml x 24 hrs Medications reviewed and include: Folic acid, Vancomycin  Labs: CBGs 70,106, Cr 0.48 (L), Hgb 8.6 (L), HCT 27.2 (L)   NUTRITION - FOCUSED PHYSICAL EXAM: 12/29 Findings: Moderate orbital, severe thoracic, lumbar, and buccal fat depletion; Moderate temple, severe clavicle, clavicle and acromion, patellar, calf muscle depletion   Diet Order:   Diet Order            Diet NPO time specified  Diet effective now                 EDUCATION NEEDS:   Education needs have been addressed  Skin:  Skin Assessment: Skin Integrity Issues: Skin Integrity Issues:: Stage II,Other (Comment) Stage II: sacrum Other: MASD left;right buttocks  Last BM:  12/28 type  7  Height:   Ht Readings from Last 1 Encounters:  11/25/20 5\' 5"  (1.651 m)    Weight:   Wt Readings from Last 1 Encounters:  11/25/20 52.8 kg    BMI:  There is no height or weight on file to calculate BMI.  Estimated Nutritional Needs:   Kcal:  1700-1900  Protein:  80-95  Fluid:  >1.4  L/day   11/27/20, RD, LDN Clinical Nutrition After Hours/Weekend Pager # in Amion

## 2020-12-07 NOTE — NC FL2 (Signed)
[place] MEDICAID FL2 LEVEL OF CARE SCREENING TOOL     IDENTIFICATION  Patient Name: Stanley Scott Birthdate: Mar 25, 1951 Sex: male Admission Date (Current Location): 12/01/2020  [place] and [place] Number:  Aaron Edelman [id number] Facility and Address:  [facility],  [address] [place] [address]      Provider Number: [id number]  Attending Physician Name and Address:  Dorcas Carrow, MD  Relative Name and Phone Number:  Freemon Binford (son) Ph: [phone]    Current Level of Care: Hospital Recommended Level of Care: Skilled Nursing Facility Prior Approval Number:    Date Approved/Denied:   PASRR Number: [id number]  Discharge Plan: SNF    Current Diagnoses: Patient Active Problem List   Diagnosis Date Noted  . Pressure injury of skin 12/07/2020  . Acute diarrhea 11/11/2020  . Generalized weakness 11/15/2020  . Hypothermia 11/24/2020  . Failure to thrive in adult 12/03/2020  . Hypotension 11/23/2020  . Symptomatic anemia 11/21/2020  . Hypoalbuminemia 11/30/2020  . Prolonged QT interval 11/28/2020  . Gastric AVM 11/19/2020  . CVA (cerebral vascular accident) (HCC) 11/28/2020  . SIRS (systemic inflammatory response syndrome) (HCC) 11/22/2020  . Hypokalemia 12/09/2020  . Essential hypertension 11/27/2020    Orientation RESPIRATION BLADDER Height & Weight     Self,Time,Situation,Place  Normal External catheter,Incontinent Weight:   Height:     BEHAVIORAL SYMPTOMS/MOOD NEUROLOGICAL BOWEL NUTRITION STATUS      Incontinent Diet (Heart healthy)  AMBULATORY STATUS COMMUNICATION OF NEEDS Skin   Extensive Assist Verbally Other (Comment) (Groin rash)                       Personal Care Assistance Level of Assistance  Bathing,Feeding,Dressing Bathing Assistance: Maximum assistance Feeding assistance: Limited assistance Dressing Assistance: Maximum assistance     Functional Limitations Info  Sight,Hearing,Speech Sight Info:  Adequate Hearing Info: Adequate Speech Info: Adequate (Patient's voice can be soft)    SPECIAL CARE FACTORS FREQUENCY  PT (By licensed PT),OT (By licensed OT)     PT Frequency: 5x's/week OT Frequency: 5x's/week            Contractures Contractures Info: Not present    Additional Factors Info  Code Status,Allergies Code Status Info: Full Allergies Info: NKA           Current Medications (12/07/2020):  This is the current hospital active medication list Current Facility-Administered Medications  Medication Dose Route Frequency Provider Last Rate Last Admin  . acetaminophen (TYLENOL) tablet 650 mg  650 mg Oral Q6H PRN Erick Blinks, MD      . aspirin EC tablet 81 mg  81 mg Oral Daily Erick Blinks, MD   81 mg at 12/07/20 1056  . atorvastatin (LIPITOR) tablet 40 mg  40 mg Oral Daily Adefeso, Oladapo, DO   40 mg at 12/07/20 1053  . Chlorhexidine Gluconate Cloth 2 % PADS 6 each  6 each Topical Daily Erick Blinks, MD   6 each at 12/07/20 1057  . feeding supplement (ENSURE ENLIVE / ENSURE PLUS) liquid 237 mL  237 mL Oral BID BM Adefeso, Oladapo, DO   237 mL at 12/07/20 1058  . folic acid (FOLVITE) tablet 1 mg  1 mg Oral Daily Adefeso, Oladapo, DO   1 mg at 12/07/20 1053  . metoprolol tartrate (LOPRESSOR) tablet 25 mg  25 mg Oral BID Erick Blinks, MD   25 mg at 12/07/20 1053  . ondansetron (ZOFRAN) injection 4 mg  4 mg Intravenous Q6H PRN Erick Blinks, MD      .  vancomycin (VANCOCIN) 50 mg/mL oral solution 125 mg  125 mg Oral QID Erick Blinks, MD   125 mg at 12/06/20 2236     Discharge Medications: Please see discharge summary for a list of discharge medications.  Relevant Imaging Results:  Relevant Lab Results:   Additional Information SSN: [id number]  Ewing Schlein, LCSW

## 2020-12-07 NOTE — Evaluation (Addendum)
Occupational Therapy Evaluation Patient Details Name: Stanley Scott MRN: [id number] DOB: 1951/01/24 Today's Date: 12/07/2020    History of Present Illness Stanley Scott is a 69 y.o. 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  Patient was unable to provide history, history was obtained from ED PA and ED medical record.  Per report, patient was reported to have had several episodes of loose to watery stools daily for the past week.  He has also had poor appetite, though family encouraged oral hydration.  Patient sustained a fall by landing on his outstretched left upper extremity while being transferred from his bed to the bedside commode (around 2 AM today).  He complained of left thumb, forearm and shoulder pain, but denies head injury.  Patient was seen in the ED on 12/17 and was suspected to have acute cystitis without hematuria, he was discharged home with Keflex at that time.   Patient was reported to be sedentary at baseline and lives with son who provide significant assistance with ADLs.  He denies chest pain, shortness of breath, nausea, vomiting, abdominal pain.   Clinical Impression   Pt agreeable to OT evaluation, oriented x3 this am, easily redirected to correct date (year). Pt with stroke-like presentation of symptoms. OT noting left facial droop, significant LUE/LLE weakness and motor planning deficits, LUE edema and sensation changes, and requiring increased time for word finding and communication. Discussed with nursing who also reports dysphagia which was not present at prior CVA. 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 No pain in LUE during session. Nursing to discuss findings with MD.  Pt  requiring significant assistance with ADLs due to weakness and motor planning deficits, recommend neurology and speech consultations, and SNF on discharge. Will continue to follow.      Follow Up Recommendations  SNF    Equipment Recommendations  None recommended by OT    Recommendations for Other Services Speech Consult  Precautions / Restrictions Precautions Precautions: Fall Restrictions Weight Bearing Restrictions: No      Mobility Bed Mobility               General bed mobility comments: Not completed    Transfers                 General transfer comment: not completed        ADL either performed or assessed with clinical judgement   ADL Overall ADL's : Needs assistance/impaired     Grooming: Wash/dry face;Moderate assistance;Bed level Grooming Details (indicate cue type and reason): pt attemting to use LUE to wash face, able to hold by draping washcloth over hand, bringing to mouth. Unable to lift LUE to reach remainder of face and forehead. Upper Body Bathing: Maximal assistance;Bed level   Lower Body Bathing: Maximal assistance;Bed level   Upper Body Dressing : Maximal assistance;Bed level   Lower Body Dressing: Maximal assistance;Bed level                 General ADL Comments: Pt requiring heavy assistance to complete ADLs due to poor BUE mobility and functional use. Also with significant weakness limiting participation in mobility tasks     Vision Baseline Vision/History: No visual deficits Patient Visual Report: No change from baseline Vision Assessment?: No apparent  visual deficits            Pertinent Vitals/Pain Pain Assessment: No/denies pain     Hand Dominance Right   Extremity/Trunk Assessment Upper Extremity Assessment Upper Extremity Assessment: RUE deficits/detail;LUE deficits/detail RUE Deficits / Details: RUE strength 2/5 throughout, unable to make a functioinal grasp-pt reports baseline since prior CVA in  10/21 RUE Sensation: WNL RUE Coordination: decreased fine motor;decreased gross motor LUE Deficits / Details: LUE shoulder strength 1/5, elbow 2/5, wrist 1/5, unable to make a functional grasp. P/ROM is full. Edematous from upper arm to fingertips LUE Sensation: decreased light touch (reports numbness from elbow to fingertips) LUE Coordination: decreased fine motor;decreased gross motor   Lower Extremity Assessment Lower Extremity Assessment: Defer to PT evaluation   Cervical / Trunk Assessment Cervical / Trunk Assessment: Kyphotic   Communication Communication Communication: No difficulties   Cognition Arousal/Alertness: Awake/alert Behavior During Therapy: WFL for tasks assessed/performed Overall Cognitive Status: Within Functional Limits for tasks assessed                                 General Comments: oriented to person, place, month, and situation. states year as 2003      Exercises Exercises: General Upper Extremity General Exercises - Upper Extremity Shoulder Flexion: PROM;Both;5 reps Shoulder Extension: PROM;Both;5 reps Shoulder ABduction: PROM;Both;5 reps Shoulder ADduction: PROM;Both;5 reps Elbow Flexion: PROM;Both;5 reps Elbow Extension: PROM;Both;5 reps Wrist Flexion: PROM;Both;5 reps Wrist Extension: PROM;Both;5 reps Digit Composite Flexion: PROM;Both;5 reps Composite Extension: PROM;Both;5 reps   Shoulder Instructions      Home Living Family/patient expects to be discharged to:: Private residence Living Arrangements: Children (son and son's girlfriend) Available Help at Discharge: Family;Available 24 hours/day Type of Home: Apartment Home Access: Stairs to enter Entrance Stairs-Number of Steps: 6 Entrance Stairs-Rails: Right;Left;Can reach both Home Layout: One level     Bathroom Shower/Tub: Chief Strategy Officer: Standard     Home Equipment: Environmental consultant - 2 wheels;Wheelchair - [employer];Shower seat;Grab bars -  tub/shower          Prior Functioning/Environment Level of Independence: Needs assistance  Gait / Transfers Assistance Needed: Uses RW for mobility, wheelchair when tired or weak ADL's / Homemaking Assistance Needed: son provides heavy assistance with ADLs including bathing, dressing, meal prep; pt can feed himself            OT Problem List: Decreased strength;Decreased activity tolerance;Impaired balance (sitting and/or standing);Decreased coordination;Decreased safety awareness;Decreased knowledge of use of DME or AE;Impaired UE functional use      OT Treatment/Interventions: Self-care/ADL training;Therapeutic exercise;Neuromuscular education;DME and/or AE instruction;Therapeutic activities;Patient/family education    OT Goals(Current goals can be found in the care plan section) Acute Rehab OT Goals Patient Stated Goal: to get stronger OT Goal Formulation: With patient Time For Goal Achievement: 12/21/20 Potential to Achieve Goals: Fair  OT Frequency: Min 2X/week                 End of Session Nurse Communication: Mobility status;Other (comment) (CVA-like presentation with LUE and facial droop, dysarthria)  Activity Tolerance: Patient tolerated treatment well Patient left: in bed;with call bell/phone within reach;with nursing/sitter in room  OT Visit Diagnosis: Muscle weakness (generalized) (M62.81);Repeated falls (R29.6)                Time: 3267-1245 OT Time Calculation (min): 40 min Charges:  OT General Charges $OT Visit: 1 Visit OT Evaluation $OT Eval Low Complexity: 1  Low OT Treatments $Therapeutic Exercise: 8-22 mins  Ezra Sites, OTR/L  [phone] 12/07/2020, 8:30 AM

## 2020-12-07 NOTE — Evaluation (Signed)
Clinical/Bedside Swallow Evaluation Patient Details  Name: Stanley Scott MRN: [id number] Date of Birth: 1951/08/17  Today's Date: 12/07/2020 Time: SLP Start Time (ACUTE ONLY): 1402 SLP Stop Time (ACUTE ONLY): 1440 SLP Time Calculation (min) (ACUTE ONLY): 38 min  Past Medical History: History reviewed. No pertinent past medical history. Past Surgical History: History reviewed. No pertinent surgical history. HPI:  Stanley Scott is a 69 y.o. 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  Patient was unable to provide history, history was obtained from ED PA and ED medical record.  Per report, patient was reported to have had several episodes of loose to watery stools daily for the past week.  He has also had poor appetite, though family encouraged oral hydration.  Patient sustained a fall by landing on his outstretched left upper extremity while being transferred from his bed to the bedside commode (around 2 AM today).  He complained of left thumb, forearm and shoulder pain, but denies head injury.  Patient was seen in the ED on 12/17 and was suspected to have acute cystitis without hematuria, he was discharged home with Keflex at that time.   Patient was reported to be sedentary at baseline and lives with son who provide significant assistance with ADLs.  He denies chest pain, shortness of breath, nausea, vomiting, abdominal pain.   Assessment / Plan / Recommendation Clinical Impression  Clinical Swallowing evaluation completed while Pt was sitting upright in the bed; Pt's speech was slow with poor articulatory precision and what seemed to be slow processing for receptive and expressive (baseline for speech and language is unknown). Note L facial droop with decreased oral strength. Pt  consumed thin liquids with anterior spillage, poor oral manipulation, suspected delayed audible swallow followed by multiple swallows (7-10 swallows for each presentation). Pt demonstrated poor oral control followed by suspected delayed swallow and a significant number of swallows for each presentation (noted with thin liquids, puree, jello and regular (peanut butter crackers). Despite small presentations of soup after 5-6 tsp bites Pt reports "It's not going down" which was followed by delayed coughing and throat clearing (Pt never coughed up or expectorated what he had swallowed). Recommend NPO pending MBSS. Above to RN. SLP Visit Diagnosis: Dysphagia, unspecified (R13.10)    Aspiration Risk  Mild aspiration risk;Moderate aspiration risk    Diet Recommendation NPO   Medication Administration: Whole meds with puree Supervision: Full supervision/cueing for compensatory strategies    Other  Recommendations Recommended Consults: Consider GI evaluation Other Recommendations: Have oral suction available   Follow up Recommendations 24 hour supervision/assistance;Skilled Nursing facility      Frequency and Duration min 2x/week  1 week       Prognosis Prognosis for Safe Diet Advancement: Fair Barriers to Reach Goals: Severity of deficits      Swallow Study   General Date of Onset: 11/16/2020 HPI: Stanley Scott is a 69 y.o. 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  Patient was unable to provide history, history was obtained from ED PA and ED medical record.  Per report, patient was reported to have had several episodes of loose to watery stools daily for  the past week.  He has also had poor appetite, though family encouraged oral hydration.  Patient sustained a fall by  landing on his outstretched left upper extremity while being transferred from his bed to the bedside commode (around 2 AM today).  He complained of left thumb, forearm and shoulder pain, but denies head injury.  Patient was seen in the ED on 12/17 and was suspected to have acute cystitis without hematuria, he was discharged home with Keflex at that time.   Patient was reported to be sedentary at baseline and lives with son who provide significant assistance with ADLs.  He denies chest pain, shortness of breath, nausea, vomiting, abdominal pain. Type of Study: Bedside Swallow Evaluation Previous Swallow Assessment: none in chart Diet Prior to this Study: Regular;Thin liquids Temperature Spikes Noted: No Respiratory Status: Room air History of Recent Intubation: No Behavior/Cognition: Alert;Cooperative;Pleasant mood Oral Cavity Assessment: Within Functional Limits Oral Care Completed by SLP: Recent completion by staff Oral Cavity - Dentition: Missing dentition;Poor condition Vision: Functional for self-feeding Self-Feeding Abilities: Total assist Patient Positioning: Upright in bed;Postural control adequate for testing Baseline Vocal Quality: Normal Volitional Cough: Strong;Congested Volitional Swallow: Able to elicit    Oral/Motor/Sensory Function Overall Oral Motor/Sensory Function: Moderate impairment Facial ROM: Reduced left Facial Symmetry: Abnormal symmetry left Facial Strength: Reduced left Facial Sensation: Reduced left   Ice Chips Ice chips: Impaired Presentation: Spoon Oral Phase Impairments: Reduced lingual movement/coordination;Reduced labial seal Oral Phase Functional Implications: Prolonged oral transit Pharyngeal Phase Impairments: Suspected delayed Swallow;Multiple swallows;Wet Vocal Quality;Throat Clearing - Delayed;Cough - Delayed   Thin Liquid Thin Liquid: Impaired Presentation: Cup;Spoon;Straw Oral Phase Impairments: Reduced lingual movement/coordination;Reduced labial  seal Oral Phase Functional Implications: Left anterior spillage;Right anterior spillage;Prolonged oral transit Pharyngeal  Phase Impairments: Suspected delayed Swallow;Multiple swallows;Wet Vocal Quality;Throat Clearing - Delayed;Cough - Delayed    Nectar Thick Nectar Thick Liquid: Not tested   Honey Thick Honey Thick Liquid: Not tested   Puree Puree: Impaired Presentation: Spoon Oral Phase Impairments: Reduced lingual movement/coordination Oral Phase Functional Implications: Prolonged oral transit;Oral residue Pharyngeal Phase Impairments: Multiple swallows;Wet Vocal Quality;Throat Clearing - Delayed;Suspected delayed Swallow   Solid     Solid: Impaired Oral Phase Impairments: Reduced lingual movement/coordination;Reduced labial seal Oral Phase Functional Implications: Prolonged oral transit;Oral residue Pharyngeal Phase Impairments: Multiple swallows;Wet Vocal Quality;Throat Clearing - Delayed       Stanley Scott H. Romie Levee, CCC-SLP Speech Language Pathologist  Georgetta Haber 12/07/2020,3:47 PM

## 2020-12-07 NOTE — Plan of Care (Signed)
  Problem: Acute Rehab OT Goals (only OT should resolve) Goal: Pt. Will Perform Eating Flowsheets (Taken 12/07/2020 0838) Pt Will Perform Eating:  with min assist  sitting Goal: Pt. Will Perform Grooming Flowsheets (Taken 12/07/2020 0838) Pt Will Perform Grooming:  with min assist  sitting Goal: Pt. Will Transfer To Toilet Flowsheets (Taken 12/07/2020 [phone]) Pt Will Transfer to Toilet:  with mod assist  stand pivot transfer  bedside commode Goal: Pt. Will Perform Toileting-Clothing Manipulation Flowsheets (Taken 12/07/2020 0838) Pt Will Perform Toileting - Clothing Manipulation and hygiene:  with mod assist  sitting/lateral leans  sit to/from stand Goal: Pt/Caregiver Will Perform Home Exercise Program Flowsheets (Taken 12/07/2020 [phone]) Pt/caregiver will Perform Home Exercise Program:  Increased strength  Both right and left upper extremity  With minimal assist  With written HEP provided

## 2020-12-07 NOTE — TOC Initial Note (Addendum)
Transition of Care [facility]) - Initial/Assessment Note   Patient Details  Name: Stanley Scott MRN: [id number] Date of Birth: 03/01/1951  Transition of Care [facility]) CM/SW Contact:    Ewing Schlein, LCSW Phone Number: 12/07/2020, 11:52 AM  Clinical Narrative: Patient is a 69 year old male who was admitted for acute diarrhea. TOC received consult for SNF placement. CSW spoke with patient's son, Maury Groninger, regarding PT and OT's recommendations for SNF. Son agreeable and requested [facility] for first choice as patient has been there for rehab before.  FL2 completed and PASRR verified. Initial referrals faxed out. TOC awaiting bed offers.  Addendum: CSW received call from [place] with [facility]. Per Elease Hashimoto, patient was at [facility] for SNF from 10/08/20-10/30/20 and is now in copay days. Elease Hashimoto is unable to accept the patient under his [place] Medicaid.  Expected Discharge Plan: Skilled Nursing Facility Barriers to Discharge: Continued Medical Work up  Patient Goals and CMS Choice Patient states their goals for this hospitalization and ongoing recovery are:: Go to rehab CMS Medicare.gov Compare Post Acute Care list provided to:: Patient Represenative (must comment) Ankit Degregorio (son)) Choice offered to / list presented to : Adult Children  Expected Discharge Plan and Services Expected Discharge Plan: Skilled Nursing Facility In-house Referral: Clinical Social Work Discharge Planning Services: NA Post Acute Care Choice: Skilled Nursing Facility Living arrangements for the past 2 months: Single Family Home             DME Arranged: N/A DME Agency: NA HH Arranged: NA HH Agency: NA  Prior Living Arrangements/Services Living arrangements for the past 2 months: Single Family Home Patient language and need for interpreter reviewed:: Yes Do you feel safe going back to the place where you live?: Yes      Need for Family Participation in Patient Care: Yes (Comment) Care giver support system  in place?: Yes (comment) Current home services: DME Dan Humphreys, wheelchair) Criminal Activity/Legal Involvement Pertinent to Current Situation/Hospitalization: No - Comment as needed  Activities of Daily Living Home Assistive Devices/Equipment: Environmental consultant (specify type),Shower chair with back,Bedside commode/3-in-1 ADL Screening (condition at time of admission) Patient's cognitive ability adequate to safely complete daily activities?: Yes Is the patient deaf or have difficulty hearing?: No Does the patient have difficulty seeing, even when wearing glasses/contacts?: No Does the patient have difficulty concentrating, remembering, or making decisions?: Yes Patient able to express need for assistance with ADLs?: Yes Does the patient have difficulty dressing or bathing?: Yes Independently performs ADLs?: No Communication: Independent Dressing (OT): Needs assistance Is this a change from baseline?: Pre-admission baseline Grooming: Needs assistance Is this a change from baseline?: Pre-admission baseline Feeding: Needs assistance Is this a change from baseline?: Pre-admission baseline Bathing: Needs assistance Is this a change from baseline?: Pre-admission baseline Toileting: Needs assistance Is this a change from baseline?: Pre-admission baseline In/Out Bed: Needs assistance Is this a change from baseline?: Pre-admission baseline Walks in Home: Needs assistance Is this a change from baseline?: Pre-admission baseline Does the patient have difficulty walking or climbing stairs?: Yes Weakness of Legs: Both Weakness of Arms/Hands: Both  Permission Sought/Granted Permission sought to share information with : Oceanographer granted to share information with : Yes, Verbal Permission Granted Permission granted to share info w AGENCY: SNFs  Emotional Assessment Orientation: : Oriented to Self,Oriented to Place,Oriented to  Time,Oriented to Situation Alcohol / Substance  Use: Not Applicable Psych Involvement: No (comment)  Admission diagnosis:  Hypokalemia [E87.6] Numbness [R20.0] Acute diarrhea [R19.7] Generalized weakness [R53.1] Hypothermia,  initial encounter [T68.XXXA] Hypotension, unspecified hypotension type [I95.9] Anemia, unspecified type [D64.9] Diarrhea, unspecified type [R19.7] Patient Active Problem List   Diagnosis Date Noted  . Pressure injury of skin 12/07/2020  . Acute diarrhea 12-18-2020  . Generalized weakness December 18, 2020  . Hypothermia Dec 18, 2020  . Failure to thrive in adult 18-Dec-2020  . Hypotension 12/18/2020  . Symptomatic anemia 18-Dec-2020  . Hypoalbuminemia 12/18/20  . Prolonged QT interval Dec 18, 2020  . 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 CVA (cerebral vascular accident) (HCC) 2020-12-18  . SIRS (systemic inflammatory response syndrome) (HCC) 12/18/20  . Hypokalemia 2020/12/18  . Essential hypertension 12/18/20   PCP:  Toma Deiters, MD Pharmacy:   [facility] [address] [place] [address] [place] [address] [place] [address] [place] [address] Phone: [phone] Fax: [phone]  Readmission Risk Interventions No flowsheet data found.

## 2020-12-07 NOTE — Progress Notes (Signed)
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  In the emergency room, responded to IV fluid hydration.  Stool tested positive for C. difficile.  Started on oral vancomycin.  Remains extremely debilitated. 12/29 ,noted to be more weak on both sides.  A stat MRI is negative for acute stroke.   Assessment & Plan:   Principal Problem:   Acute diarrhea Active Problems:   Generalized weakness   Hypothermia   Failure to thrive in adult   Hypotension   Symptomatic anemia   Hypoalbuminemia   Prolonged QT interval   Gastric AVM   CVA (cerebral vascular accident) (HCC)   SIRS (systemic inflammatory response syndrome) (HCC)   Hypokalemia   Essential hypertension   Pressure injury of skin  C. difficile diarrhea: Clinically improving.  No loose stool for last 24 hours.  On oral vancomycin.  Will treat with 10 days of therapy.  Physical debility/deconditioning with recent history of stroke and hemiparesis: Remains extremely debilitated and deconditioned due to multiple medical issues.  Aggressively work with PT OT.  Will refer to a skilled nursing facility for rehab. Repeat MRI negative for acute stroke. Will check B12 and folic acid levels.  Electrolyte abnormalities, hypokalemia/hypomagnesemia: With poor oral intake.  Replaced aggressively.  Recheck levels today.  History of a stroke: No new stroke.  On aspirin and statin.  Repeat speech, PT and OT today.  Failure to thrive/severe protein calorie  malnutrition: Repeat speech eval. Calorie count Supplemental nutrition  Hypertension: On metoprolol.  Stable.  Anemia of chronic disease: Hemoglobin 7.2.  Transfuse 2 units of PRBC.  Hemoccult negative.  Responded appropriately.  Recheck today.      Continue to work with PT OT.  Repeated speech eval today.  Calorie count.     DVT prophylaxis: SCDs Start: 12-19-2020 1913   Code Status: Full code Family Communication: Son at the bedside Disposition Plan: Status is: Inpatient  Remains inpatient appropriate because:Inpatient level of care appropriate due to severity of illness   Dispo: The patient is from: Home              Anticipated d/c is to: SNF              Anticipated d/c date is: 2 days              Patient currently is not medically stable to d/c.         Consultants:   None  Procedures:   None  Antimicrobials:   Vancomycin oral, 12/27---   Subjective: Patient seen and examined.  Son was at the bedside in the morning rounds.  Went through details about his previous history.  He is extremely debilitated.  Complains of difficulty swallowing.  Not eating well at home.  Weak all over. Son noted that he has more drooping of left side of the face. Blood sugar noted to be 70, and he had temperature of 93 that responded to rewarming. No more diarrhea.  Objective: Vitals:   12/06/20 2000 12/06/20 2036 12/06/20 2112 12/07/20 1051  BP:  125/74 111/72  Pulse: 91  89 79  Resp: (!) 22  20 17   Temp:   97.8 F (36.6 C) (!) 93.1 F (33.9 C)  TempSrc:   Oral Rectal  SpO2:  98% 100% 100%    Intake/Output Summary (Last 24 hours) at 12/07/2020 1241 Last data filed at 12/07/2020 0500 Gross per 24 hour  Intake --  Output 101 ml  Net -101 ml   There were no vitals filed for this visit.  Examination:  General exam: Chronically sick looking, frail and debilitated gentleman lying in bed. Respiratory system: Clear to auscultation. Respiratory effort normal.   No added sounds. Cardiovascular system: S1 & S2 heard, RRR. No JVD, murmurs, rubs, gallops or clicks. No pedal edema. Gastrointestinal system: Soft and nontender.. Central nervous system: Alert and oriented.  All extremities are weak and upper extremities power 4/5 bilateral. He is not able to move his lower extremities against gravity.  Bilateral equal. Psychiatry: Judgement and insight appear normal. Mood & affect flat and anxious.    Data Reviewed: I have personally reviewed following labs and imaging studies  CBC: Recent Labs  Lab 11/23/2020 1217 12/06/20 0405  WBC 7.2 9.0  NEUTROABS 5.6  --   HGB 7.2* 8.6*  HCT 23.7* 27.2*  MCV 93.7 90.1  PLT 271 242   Basic Metabolic Panel: Recent Labs  Lab 11/16/2020 1217 11/11/2020 1554 12/06/20 0405  NA 135  --  136  K 2.7*  --  3.5  CL 100  --  103  CO2 21*  --  21*  GLUCOSE 99  --  70  BUN 15  --  9  CREATININE 0.66  --  0.48*  CALCIUM 8.5*  --  7.9*  MG  --  1.7 1.9  PHOS  --   --  4.3   GFR: Estimated Creatinine Clearance: 65.1 mL/min (A) (by C-G formula based on SCr of 0.48 mg/dL (L)). Liver Function Tests: Recent Labs  Lab 11/23/2020 1217 12/06/20 0405  AST 24 21  ALT 9 9  ALKPHOS 118 100  BILITOT 0.9 0.8  PROT 6.7 5.7*  ALBUMIN 2.2* 1.8*   No results for input(s): LIPASE, AMYLASE in the last 168 hours. No results for input(s): AMMONIA in the last 168 hours. Coagulation Profile: Recent Labs  Lab 12/08/2020 1217 12/06/20 0405  INR 1.2 1.2   Cardiac Enzymes: No results for input(s): CKTOTAL, CKMB, CKMBINDEX, TROPONINI in the last 168 hours. BNP (last 3 results) No results for input(s): PROBNP in the last 8760 hours. HbA1C: No results for input(s): HGBA1C in the last 72 hours. CBG: Recent Labs  Lab 12/06/20 1259 12/07/20 1129  GLUCAP 106* 70   Lipid Profile: No results for input(s): CHOL, HDL, LDLCALC, TRIG, CHOLHDL, LDLDIRECT in the last 72 hours. Thyroid Function Tests: Recent Labs    11/30/2020 1636   TSH 4.426   Anemia Panel: No results for input(s): VITAMINB12, FOLATE, FERRITIN, TIBC, IRON, RETICCTPCT in the last 72 hours. Sepsis Labs: Recent Labs  Lab 11/26/2020 1217 11/20/2020 1521  LATICACIDVEN 1.0 1.0    Recent Results (from the past 240 hour(s))  Blood Culture (routine x 2)     Status: None (Preliminary result)   Collection Time: 12/01/2020 12:17 PM   Specimen: BLOOD  Result Value Ref Range Status   Specimen Description BLOOD BLOOD RIGHT FOREARM  Final   Special Requests   Final    BOTTLES DRAWN AEROBIC AND ANAEROBIC Blood Culture adequate volume   Culture   Final  NO GROWTH < 24 HOURS Performed at [facility], [address], [place], [place] [address]    Report Status PENDING  Incomplete  Blood Culture (routine x 2)     Status: None (Preliminary result)   Collection Time: Jan 04, 2021 12:45 PM   Specimen: BLOOD  Result Value Ref Range Status   Specimen Description BLOOD LEFT ANTECUBITAL  Final   Special Requests   Final    BOTTLES DRAWN AEROBIC AND ANAEROBIC Blood Culture results may not be optimal due to an inadequate volume of blood received in culture bottles   Culture   Final    NO GROWTH < 24 HOURS Performed at [facility], [address] [place], [place] [address]    Report Status PENDING  Incomplete  Resp Panel by RT-PCR (Flu A&B, Covid) Nasopharyngeal Swab     Status: None   Collection Time: 2021/01/04  2:12 PM   Specimen: Nasopharyngeal Swab; Nasopharyngeal(NP) swabs in vial transport medium  Result Value Ref Range Status   SARS Coronavirus 2 by RT PCR NEGATIVE NEGATIVE Final    Comment: (NOTE) SARS-CoV-2 target nucleic acids are NOT DETECTED.  The SARS-CoV-2 RNA is generally detectable in upper respiratory specimens during the acute phase of infection. The lowest concentration of SARS-CoV-2 viral copies this assay can detect is 138 copies/mL. A negative result does not preclude SARS-Cov-2 infection and should not be used as the sole basis for treatment  or other patient management decisions. A negative result may occur with  improper specimen collection/handling, submission of specimen other than nasopharyngeal swab, presence of viral mutation(s) within the areas targeted by this assay, and inadequate number of viral copies(<138 copies/mL). A negative result must be combined with clinical observations, patient history, and epidemiological information. The expected result is Negative.  Fact Sheet for Patients:  BloggerCourse.com  Fact Sheet for Healthcare Providers:  SeriousBroker.it  This test is no t yet approved or cleared by the [place] FDA and  has been authorized for detection and/or diagnosis of SARS-CoV-2 by FDA under an Emergency Use Authorization (EUA). This EUA will remain  in effect (meaning this test can be used) for the duration of the COVID-19 declaration under Section 564(b)(1) of the Act, 21 U.S.C.section 360bbb-3(b)(1), unless the authorization is terminated  or revoked sooner.       Influenza A by PCR NEGATIVE NEGATIVE Final   Influenza B by PCR NEGATIVE NEGATIVE Final    Comment: (NOTE) The Xpert Xpress SARS-CoV-2/FLU/RSV plus assay is intended as an aid in the diagnosis of influenza from Nasopharyngeal swab specimens and should not be used as a sole basis for treatment. Nasal washings and aspirates are unacceptable for Xpert Xpress SARS-CoV-2/FLU/RSV testing.  Fact Sheet for Patients: BloggerCourse.com  Fact Sheet for Healthcare Providers: SeriousBroker.it  This test is not yet approved or cleared by the [place] FDA and has been authorized for detection and/or diagnosis of SARS-CoV-2 by FDA under an Emergency Use Authorization (EUA). This EUA will remain in effect (meaning this test can be used) for the duration of the COVID-19 declaration under Section 564(b)(1) of the Act, 21 U.S.C. section  360bbb-3(b)(1), unless the authorization is terminated or revoked.  Performed at [facility], [address], [place], [place] [address]   C Difficile Quick Screen w PCR reflex     Status: Abnormal   Collection Time: 12/06/20  4:00 PM   Specimen: STOOL  Result Value Ref Range Status   C Diff antigen POSITIVE (A) NEGATIVE Final   C Diff  toxin POSITIVE (A) NEGATIVE Final    Comment: CRITICAL RESULT CALLED TO, READ BACK BY AND VERIFIED WITH: CARA LINGLER@1730  12/06/20 BY JONES,T    C Diff interpretation Toxin producing C. difficile detected.  Final    Comment: CRITICAL RESULT CALLED TO, READ BACK BY AND VERIFIED WITH: CARA LINGER @1730  12/06/20 BY JONES,T Performed at [facility], [address] [place], [place] [address]          Radiology Studies: DG Forearm Left  Result Date: 2020-01-14 CLINICAL DATA:  Fall EXAM: LEFT FOREARM - 2 VIEW COMPARISON:  None. FINDINGS: There is no evidence of fracture or other focal bone lesions. Soft tissues are unremarkable. Degenerative change in the radiocarpal joint. IMPRESSION: Negative for fracture. Electronically Signed   By: Marlan Palauharles  Clark M.D.   On: 2020-01-14 16:25   MR BRAIN WO CONTRAST  Result Date: 12/07/2020 CLINICAL DATA:  Acute neuro deficit. EXAM: MRI HEAD WITHOUT CONTRAST TECHNIQUE: Multiplanar, multiecho pulse sequences of the brain and surrounding structures were obtained without intravenous contrast. COMPARISON:  CT head 2020-01-14.  CT angio head and neck 09/26/2020 FINDINGS: Brain: Negative for acute infarct. Mild white matter changes bilaterally. Small chronic infarct left frontal lobe over the convexity. Negative for hemorrhage or mass. Moderate atrophy without hydrocephalus. Vascular: Normal arterial flow voids Skull and upper cervical spine: No focal skeletal lesion. Cervical spondylosis with bony sclerosis as noted on the prior CTA neck. Sinuses/Orbits: Mild mucosal edema paranasal sinuses. Negative orbit. Other: None  IMPRESSION: Moderate atrophy.  Small chronic infarct left frontal lobe. Negative for acute infarct. Electronically Signed   By: Marlan Palauharles  Clark M.D.   On: 12/07/2020 10:58   DG Chest Port 1 View  Result Date: 2020-01-14 CLINICAL DATA:  Questionable sepsis. EXAM: PORTABLE CHEST 1 VIEW COMPARISON:  December 17, 21. FINDINGS: The heart size and mediastinal contours are within normal limits. Aortic atherosclerosis. No consolidation. Similar mild right basilar atelectasis/scar. No visible pleural effusions or pneumothorax. Right-sided skin fold. No acute osseous abnormality. EKG leads project over the chest. IMPRESSION: No acute cardiopulmonary disease. Electronically Signed   By: Feliberto HartsFrederick S Jones MD   On: 2020-01-14 13:23   DG Shoulder Left  Result Date: 2020-01-14 CLINICAL DATA:  Fall EXAM: LEFT SHOULDER - 2+ VIEW COMPARISON:  None. FINDINGS: There is no evidence of fracture or dislocation. There is no evidence of arthropathy or other focal bone abnormality. Soft tissues are unremarkable. IMPRESSION: Negative. Electronically Signed   By: Marlan Palauharles  Clark M.D.   On: 2020-01-14 16:26   DG Hand Complete Left  Result Date: 2020-01-14 CLINICAL DATA:  Fall EXAM: LEFT HAND - COMPLETE 3+ VIEW COMPARISON:  None. FINDINGS: Negative for acute fracture Moderate degenerative change in the radiocarpal joint with joint space narrowing and spurring. No erosion identified. IMPRESSION: Negative for fracture. Electronically Signed   By: Marlan Palauharles  Clark M.D.   On: 2020-01-14 16:24        Scheduled Meds: . aspirin EC  81 mg Oral Daily  . atorvastatin  40 mg Oral Daily  . Chlorhexidine Gluconate Cloth  6 each Topical Daily  . feeding supplement  237 mL Oral BID BM  . folic acid  1 mg Oral Daily  . metoprolol tartrate  25 mg Oral BID  . vancomycin  125 mg Oral QID   Continuous Infusions:   LOS: 2 days    Time spent: 35 minutes    Dorcas CarrowKuber Shamira Toutant, MD [facility] Pager [phone]

## 2020-12-07 NOTE — Plan of Care (Signed)
  Problem: SLP Dysphagia Goals Goal: Patient will demonstrate readiness for PO's Description: Patient will demonstrate readiness for PO's and/or instrumental swallow study as evidenced by: Flowsheets (Taken 12/07/2020 1548) Patient will demonstrate readiness for PO's and/or instrumental swallow study as evidenced by: with max assist   Braelin Brosch H. Romie Levee, CCC-SLP Speech Language Pathologist

## 2020-12-08 ENCOUNTER — Encounter (HOSPITAL_COMMUNITY): Payer: Self-pay | Admitting: Internal Medicine

## 2020-12-08 ENCOUNTER — Inpatient Hospital Stay (HOSPITAL_COMMUNITY): Payer: Medicare Other

## 2020-12-08 DIAGNOSIS — Z7189 Other specified counseling: Secondary | ICD-10-CM

## 2020-12-08 DIAGNOSIS — R197 Diarrhea, unspecified: Secondary | ICD-10-CM | POA: Diagnosis not present

## 2020-12-08 DIAGNOSIS — Z515 Encounter for palliative care: Secondary | ICD-10-CM | POA: Diagnosis not present

## 2020-12-08 DIAGNOSIS — R627 Adult failure to thrive: Secondary | ICD-10-CM | POA: Diagnosis not present

## 2020-12-08 LAB — CBC WITH DIFFERENTIAL/PLATELET
Abs Immature Granulocytes: 0.11 10*3/uL — ABNORMAL HIGH (ref 0.00–0.07)
Basophils Absolute: 0 10*3/uL (ref 0.0–0.1)
Basophils Relative: 0 %
Eosinophils Absolute: 0 10*3/uL (ref 0.0–0.5)
Eosinophils Relative: 1 %
HCT: 30.8 % — ABNORMAL LOW (ref 39.0–52.0)
Hemoglobin: 9.5 g/dL — ABNORMAL LOW (ref 13.0–17.0)
Immature Granulocytes: 1 %
Lymphocytes Relative: 9 %
Lymphs Abs: 0.7 10*3/uL (ref 0.7–4.0)
MCH: 28.3 pg (ref 26.0–34.0)
MCHC: 30.8 g/dL (ref 30.0–36.0)
MCV: 91.7 fL (ref 80.0–100.0)
Monocytes Absolute: 0.3 10*3/uL (ref 0.1–1.0)
Monocytes Relative: 4 %
Neutro Abs: 6.5 10*3/uL (ref 1.7–7.7)
Neutrophils Relative %: 85 %
Platelets: 247 10*3/uL (ref 150–400)
RBC: 3.36 MIL/uL — ABNORMAL LOW (ref 4.22–5.81)
RDW: 21.1 % — ABNORMAL HIGH (ref 11.5–15.5)
WBC: 7.7 10*3/uL (ref 4.0–10.5)
nRBC: 0 % (ref 0.0–0.2)

## 2020-12-08 LAB — GLUCOSE, CAPILLARY
Glucose-Capillary: 67 mg/dL — ABNORMAL LOW (ref 70–99)
Glucose-Capillary: 69 mg/dL — ABNORMAL LOW (ref 70–99)
Glucose-Capillary: 187 mg/dL — ABNORMAL HIGH (ref 70–99)
Glucose-Capillary: 94 mg/dL (ref 70–99)
Glucose-Capillary: 125 mg/dL — ABNORMAL HIGH (ref 70–99)
Glucose-Capillary: 212 mg/dL — ABNORMAL HIGH (ref 70–99)

## 2020-12-08 IMAGING — MR MR CERVICAL SPINE WO/W CM
4 of 8 series · 21 of 48 positions shown · IV contrast (gadavist)
Comparison: CT angiogram of the neck [DATE].

CLINICAL DATA: Bilateral upper extremity and lower extremity
weakness. The patient suffered a fall approximately 4 weeks ago.

EXAM:
MRI CERVICAL SPINE WITHOUT AND WITH CONTRAST
TECHNIQUE: Multiplanar and multiecho pulse sequences of the cervical spine, to
include the craniocervical junction and cervicothoracic junction,
were obtained without and with intravenous contrast.
CONTRAST:  5 mL GADAVIST IV SOLN

[Series 7: STIR · sagittal · 3.0mm · 0.86mm/px · 4 of 15 slices shown]
[im 1/15]
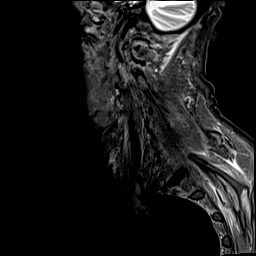
[im 5/15]
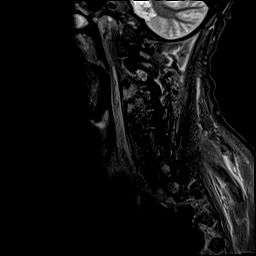
[im 10/15]
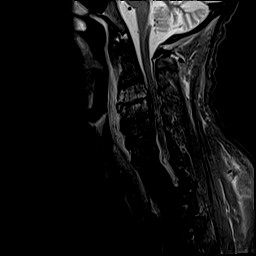
[im 15/15]
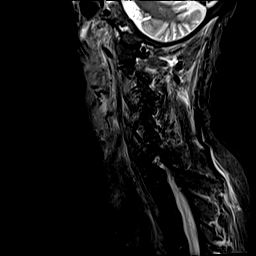

[Series 11: T1 post-contrast · sagittal · 3.0mm · 0.43mm/px · 4 of 15 slices shown (1 of 2)]
[im 1/15]
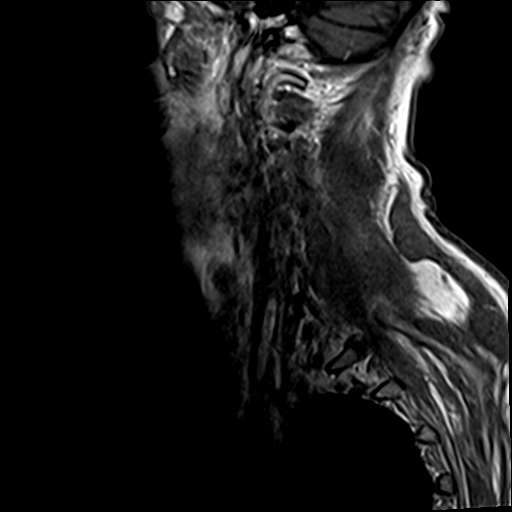
[im 5/15]
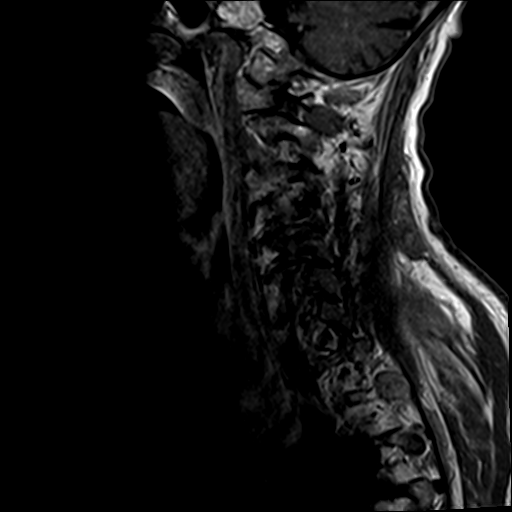
[im 10/15]
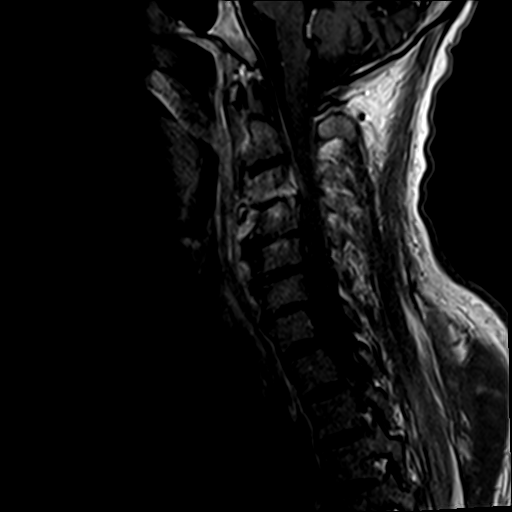
[im 15/15]
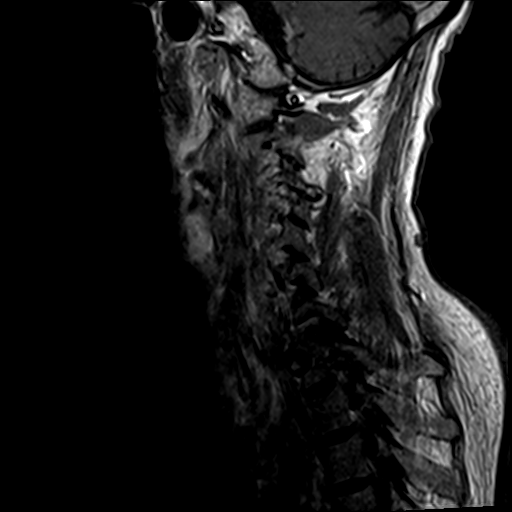

[Series 1034: T1 · axial · non-contrast · 3.0mm · 0.24mm/px · z∈[-44,+41]mm · 8 of 30 slices shown]
[im 1/30]
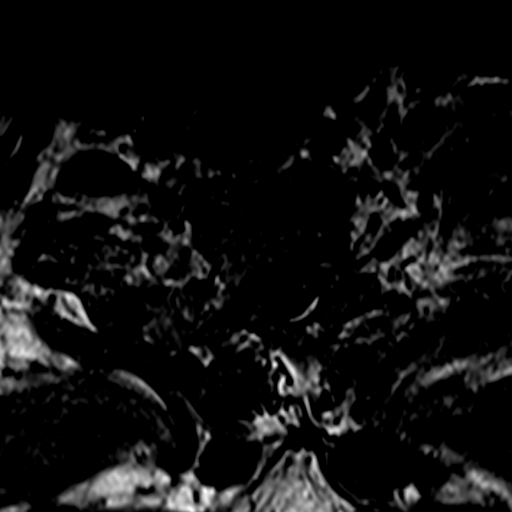
[im 5/30]
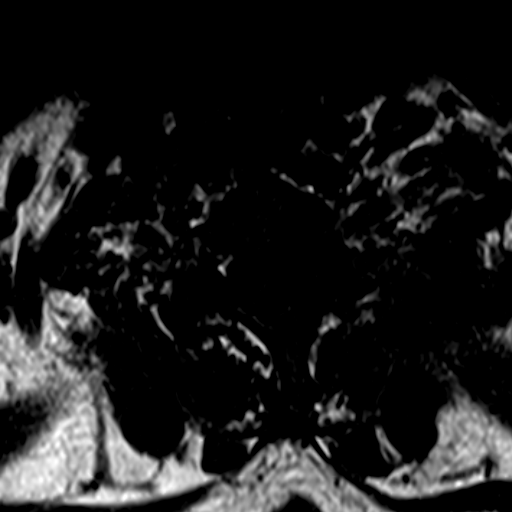
[im 9/30]
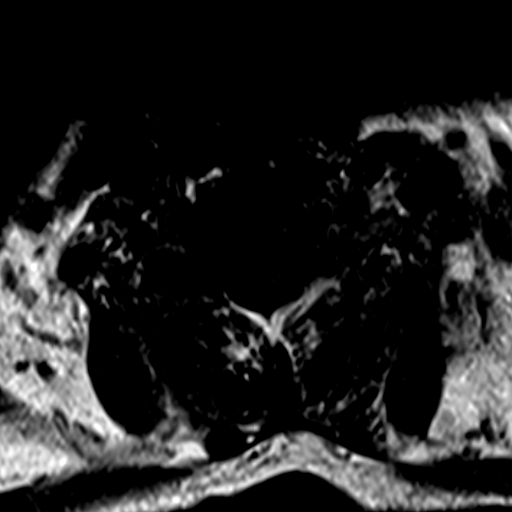
[im 13/30]
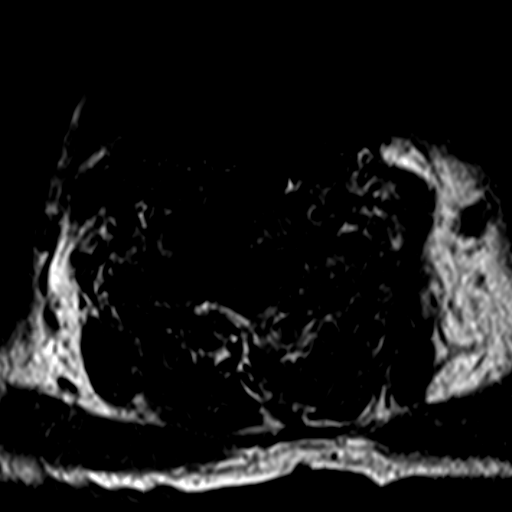
[im 17/30]
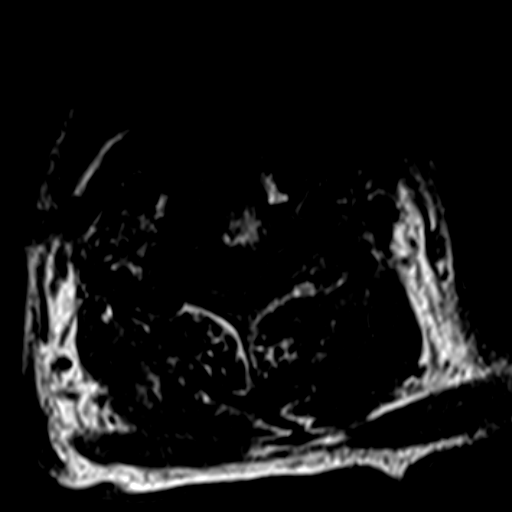
[im 21/30]
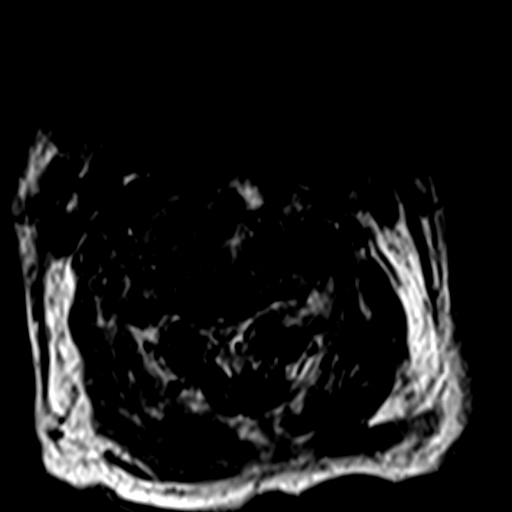
[im 25/30]
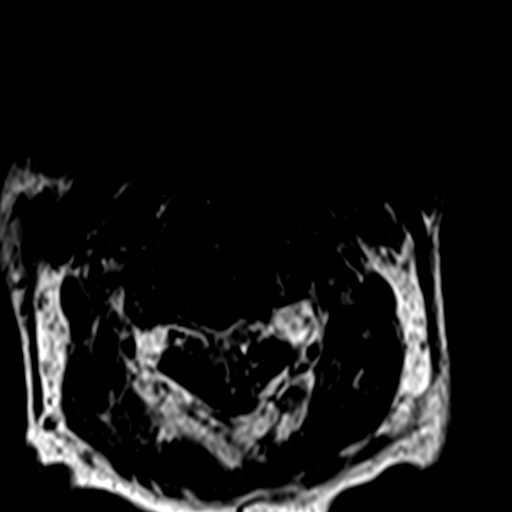
[im 30/30]
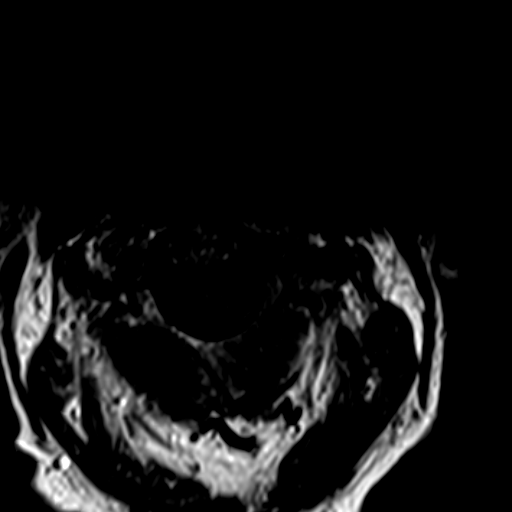

[Series 1038: T1 post-contrast · axial · 3.0mm · 0.24mm/px · z∈[-43,+28]mm · 5 of 30 slices shown (2 of 2)]
[im 1/30]
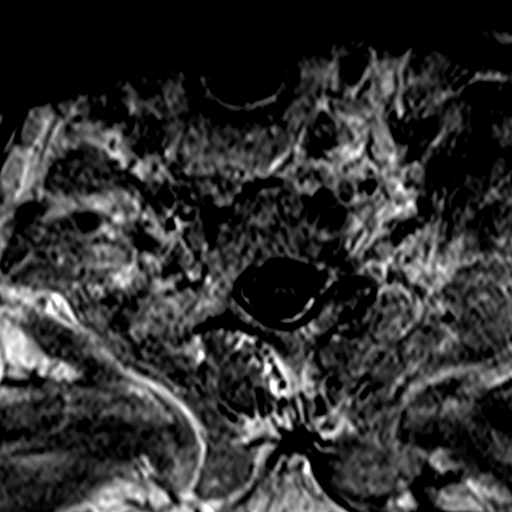
[im 5/30]
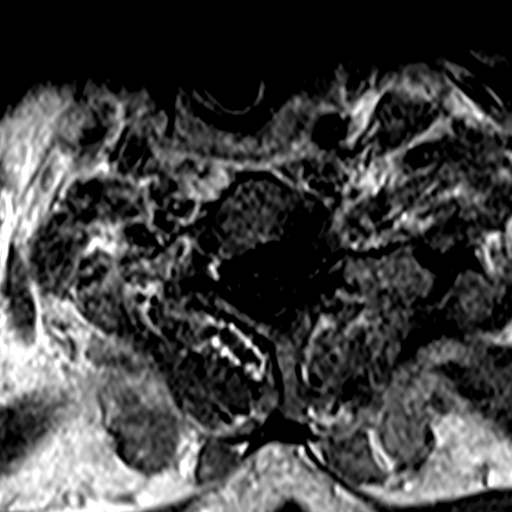
[im 9/30]
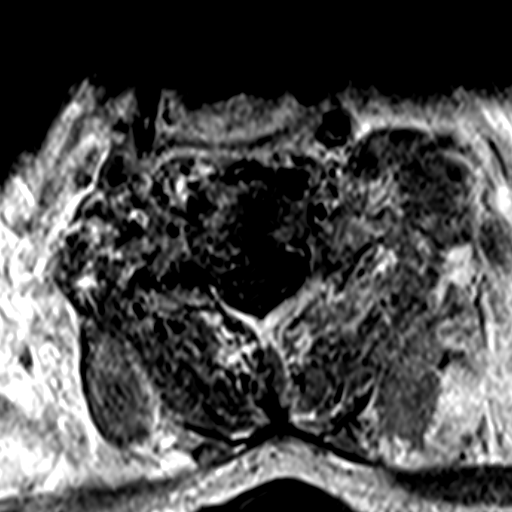
[im 17/30]
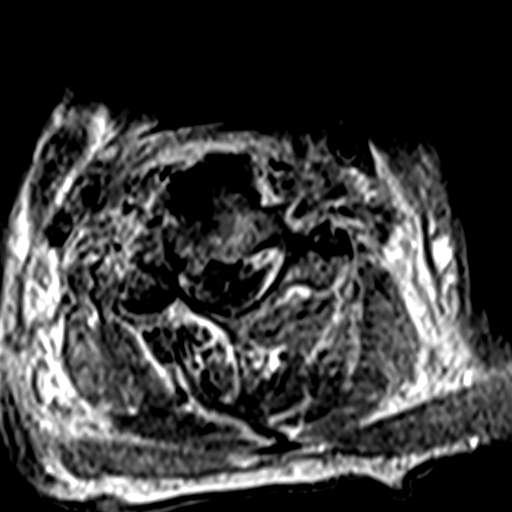
[im 25/30]
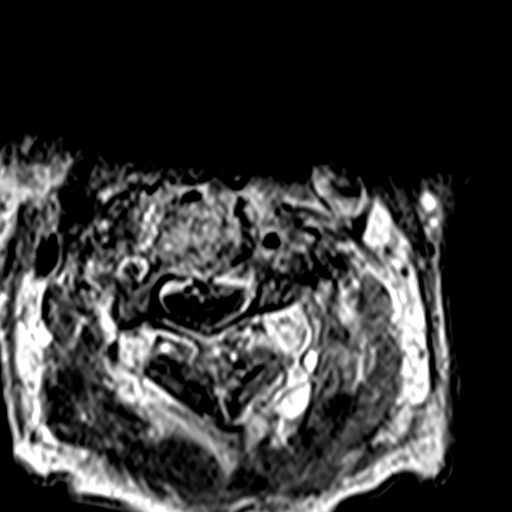

[21 of 48 positions shown; findings below may reference images not displayed]

FINDINGS: Alignment: There is mild reversal of the normal cervical lordosis.

Vertebrae: No fracture, evidence of discitis, or bone lesion.
Degenerative endplate signal change is seen from C3-4 to C6-7, worst
at C3-4.

Cord: Edema without enhancement is seen in the cervical cord from
approximately the inferior endplate of C3 is through the C4-5 level.

Posterior Fossa, vertebral arteries, paraspinal tissues: Negative.

Disc levels:

C2-3: Disc bulge effaces the ventral thecal sac but the central
canal and foramina appear open.

C3-4: Disc bulge, uncovertebral disease and left much worse than
right facet arthropathy. There is severe central canal and bilateral
foraminal stenosis. The cord is markedly deformed.

C4-5: Disc bulge, uncovertebral disease and facet arthropathy on the
left. There is severe central canal and bilateral foraminal
narrowing. The cord is severely flattened.

C5-6: Disc bulge and right worse than left uncovertebral disease.
There is also moderate bilateral facet arthropathy. The ventral
thecal sac is nearly effaced. Moderately severe to severe foraminal
narrowing is worse on the right.

C6-7: Disc bulge and uncovertebral spurring. Bilateral facet
arthropathy. The central canal is open. Severe bilateral foraminal
narrowing.

C7-T1: Negative.
IMPRESSION: Severe central canal and bilateral foraminal stenosis at C3-4 and
C4-5. There is edema within the cervical cord from approximately the
superior endplate of C3 through the C4-5 level consistent with
myelomalacia or ischemia due to compression.

Moderately severe to severe bilateral foraminal narrowing at C5-6 is
worse on the right. The ventral thecal sac is nearly effaced by disc
at this level.

Severe bilateral foraminal narrowing at C6-7. The central canal is
open at this level.

These results were called by telephone at the time of interpretation
on [DATE] at [DATE] to provider ASIDI , who verbally
acknowledged these results.

## 2020-12-08 MED ORDER — DEXTROSE 50 % IV SOLN
12.5000 g | INTRAVENOUS | Status: AC
  Administered 2020-12-08: 08:00:00 12.5 g via INTRAVENOUS

## 2020-12-08 MED ORDER — JUVEN PO PACK
1.0000 | PACK | Freq: Two times a day (BID) | ORAL | Status: DC
  Administered 2020-12-08 – 2020-12-23 (×25): 1 via ORAL
  Filled 2020-12-08 (×29): qty 1

## 2020-12-08 MED ORDER — DEXTROSE-NACL 5-0.9 % IV SOLN: INTRAVENOUS | Status: DC

## 2020-12-08 MED ORDER — DEXTROSE 50 % IV SOLN
INTRAVENOUS | Status: AC
  Filled 2020-12-08: qty 50

## 2020-12-08 MED ORDER — ADULT MULTIVITAMIN W/MINERALS CH
1.0000 | ORAL_TABLET | Freq: Every day | ORAL | Status: DC
  Administered 2020-12-08 – 2020-12-23 (×15): 1 via ORAL
  Filled 2020-12-08 (×16): qty 1

## 2020-12-08 MED ORDER — DEXAMETHASONE SODIUM PHOSPHATE 10 MG/ML IJ SOLN
8.0000 mg | Freq: Four times a day (QID) | INTRAMUSCULAR | Status: DC
  Administered 2020-12-08 – 2020-12-13 (×20): 8 mg via INTRAVENOUS
  Filled 2020-12-08 (×21): qty 1

## 2020-12-08 MED ORDER — GADOBUTROL 1 MMOL/ML IV SOLN
5.0000 mL | Freq: Once | INTRAVENOUS | Status: AC | PRN
  Administered 2020-12-08: 5 mL via INTRAVENOUS

## 2020-12-08 NOTE — TOC Progression Note (Signed)
Transition of Care [facility]) - Progression Note   Patient Details  Name: Stanley Scott MRN: [id number] Date of Birth: 03/07/1951  [facility]) CM/SW Contact  Ewing Schlein, LCSW Phone Number: 12/08/2020, 9:55 AM  Clinical Narrative: Per conversation with Revonda Standard at [facility], the facility can only accept the patient for SNF if the copay is paid up front as [facility] does not accept [place].  CSW spoke with patient's son regarding the copay and son reported the patient and family cannot afford the daily copays. Son stated "it would be difficult" to care for the patient at home without him going to rehab. CSW explained that patient can be referred to SNFs in [place] under his Medicaid, but if no bed offers are made the family would need to consider other options such as returning home with [facility] services. Son reported he has an appointment with DSS on 12/14/20 to get patient's Medicaid set up for [place]. CSW faxed referrals to [facility] SNFs. TOC awaiting bed offers.  Expected Discharge Plan: Skilled Nursing Facility Barriers to Discharge: Continued Medical Work up  Expected Discharge Plan and Services Expected Discharge Plan: Skilled Nursing Facility In-house Referral: Clinical Social Work Discharge Planning Services: NA Post Acute Care Choice: Skilled Nursing Facility Living arrangements for the past 2 months: Single Family Home             DME Arranged: N/A DME Agency: NA HH Arranged: NA HH Agency: NA  Readmission Risk Interventions No flowsheet data found.

## 2020-12-08 NOTE — Progress Notes (Signed)
Nutrition Brief Note  48 hour Calorie Count ordered 12/29 at 1233, however patient NPO s/p failed BSE, unable to initiate. He underwent MBSS this afternoon, diet advanced to DYS 2 with thin liquids. RD met with patient yesterday, please see 12/29 note for full assessment.  MRI today shows severe cervical stenosis, spinal cord edema. He is pending transfer to [facility] for possible C-spine decompression. Spoke with MD via secure chat, RD to proceed with calorie count.  Calorie count envelope hanging on patient's door with instructions. RD will provide day 1 results on 12/09/20.  Lajuan Lines, RD, LDN Clinical Nutrition After Hours/Weekend Pager # in [place]

## 2020-12-08 NOTE — Progress Notes (Signed)
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  In the emergency room, responded to IV fluid hydration.  Stool tested positive for C. difficile.  Started on oral vancomycin.  Remains extremely debilitated. 12/29 ,noted to be more weak on both sides.  A stat MRI head is negative for acute stroke. 12/30, with persistent quadriparesis, MRI of the cervical spine was done that shows severe cervical stenosis and spinal cord edema.  Started on steroids.  Discussed with neurosurgery and initiated transfer to [facility] for possible C-spine decompression.   Assessment & Plan:   Principal Problem:   Acute diarrhea Active Problems:   Generalized weakness   Hypothermia   Failure to thrive in adult   Hypotension   Symptomatic anemia   Hypoalbuminemia   Prolonged QT interval   Gastric AVM   CVA (cerebral vascular accident) (HCC)   SIRS (systemic inflammatory response syndrome) (HCC)   Hypokalemia   Essential hypertension   Pressure injury of skin   Protein-calorie malnutrition, severe  C. difficile diarrhea: Clinically improving.  Diarrhea improved.  On oral vancomycin.  Day 6/10.  Post episode.  Physical debility/deconditioning with recent history of stroke and hemiparesis: Severe cervical spine disease/C-spine compression: Remains extremely debilitated and deconditioned due to multiple medical issues. Continue to work with PT OT.  Will ultimately need rehab.   Repeat MRI negative for acute stroke.   B12, folic acid levels normal. 12/30, MRI of the cervical spine shows severe cord compression, spinal cord edema.  Patient reported fall about 4 weeks ago.  Probably subacute cord compression. Case discussed with neurosurgery, Dr. Conchita Paris.  He started on IV steroids. Transfer to [facility] for possible decompression of the cervical spine.  Electrolyte abnormalities, hypokalemia/hypomagnesemia: With poor oral intake.  Replaced aggressively.  Levels improved.  History of a stroke: No new stroke.  On aspirin and statin.  Repeat speech, PT and OT today. Modified barium swallow today.  Failure to thrive/severe protein calorie malnutrition: Repeat speech eval. modified barium swallow today. Calorie count Supplemental nutrition when able to take by mouth.  Hypertension: On metoprolol.  Stable.  Anemia of chronic disease: Hemoglobin 7.2.  Transfuse 2 units of PRBC.  Hemoccult negative.  Responded appropriately.   Signs/Symptoms: moderate fat depletion,severe fat depletion,moderate muscle depletion,severe muscle depletion,percent weight loss Percent weight loss: 7.4 % (9 lbs x 6 weeks) Interventions: MVI,Magic cup,Ensure Enlive (each supplement provides 350kcal and 20 grams of protein)  With MRI cervical spine results, case discussed with neurosurgery Dr. Conchita Paris.  Advised transfer to [facility] campus for formal evaluation.  Neurosurgery team thinks he will benefit with C-spine surgery if he is able to tolerate.  They will evaluate him once he arrives here.  DVT prophylaxis: SCDs Start: 12/04/2020 1913   Code Status: Full code Family Communication: Patient's son called and updated multiple times today. Disposition Plan: Status is: Inpatient  Remains inpatient appropriate because:Inpatient level of care appropriate due to severity of illness   Dispo: The patient is from: Home  Anticipated d/c is to: SNF              Anticipated d/c date is: Unknown at this time.               Patient currently is not medically stable to d/c.         Consultants:   Neurosurgery, will be seen once [facility].  Procedures:   None  Antimicrobials:   Vancomycin oral, 12/27---   Subjective: Patient seen and examined.  Overnight noted to be hypoglycemic with blood sugars less than 90.  He is n.p.o. for modified barium swallow.   Temperature is better today.   Speech is slightly better.  He is not sure about swallowing.   No more diarrhea.   Feels extremely weak.   Objective: Vitals:   12/07/20 1847 12/07/20 2314 12/08/20 0500 12/08/20 0621  BP:  107/65  123/79  Pulse:  88  (!) 102  Resp:  18  18  Temp:  97.6 F (36.4 C)  (!) 97.5 F (36.4 C)  TempSrc:  Oral    SpO2:  100%  97%  Weight: 50.8 kg  52.8 kg   Height: 5\' 5"  (1.651 m)       Intake/Output Summary (Last 24 hours) at 12/08/2020 1349 Last data filed at 12/08/2020 1200 Gross per 24 hour  Intake 200 ml  Output --  Net 200 ml   Filed Weights   12/07/20 1847 12/08/20 0500  Weight: 50.8 kg 52.8 kg    Examination:  General exam: Chronically sick looking, frail and debilitated gentleman lying in bed. Respiratory system: Clear to auscultation. Respiratory effort normal.  No added sounds. Cardiovascular system: S1 & S2 heard, RRR. No JVD, murmurs, rubs, gallops or clicks. No pedal edema. Gastrointestinal system: Soft and nontender.. Central nervous system: Alert and oriented.  All extremities are weak and upper extremities power 4/5 bilateral.  Left more than right. He is not able to move his lower extremities against gravity.  3/5. Psychiatry: Judgement and insight appear normal. Mood & affect flat and anxious.    Data Reviewed: I have personally reviewed following labs and imaging studies  CBC: Recent Labs  Lab 11/12/2020 1217 12/06/20 0405 12/08/20 0433  WBC 7.2 9.0 7.7  NEUTROABS 5.6  --  6.5  HGB 7.2* 8.6* 9.5*  HCT 23.7* 27.2* 30.8*  MCV 93.7 90.1 91.7  PLT 271  242 247   Basic Metabolic Panel: Recent Labs  Lab 12/07/2020 1217 12/01/2020 1554 12/06/20 0405 12/07/20 1401  NA 135  --  136 136  K 2.7*  --  3.5 3.8  CL 100  --  103 105  CO2 21*  --  21* 21*  GLUCOSE 99  --  70 102*  BUN 15  --  9 10  CREATININE 0.66  --  0.48* 0.47*  CALCIUM 8.5*  --  7.9* 8.2*  MG  --  1.7 1.9 1.9  PHOS  --   --  4.3 4.4   GFR: Estimated Creatinine Clearance: 65.1 mL/min (A) (by C-G formula based on SCr of 0.47 mg/dL (L)). Liver Function Tests: Recent Labs  Lab 11/30/2020 1217 12/06/20 0405 12/07/20 1401  AST 24 21 48*  ALT 9 9 13   ALKPHOS 118 100 100  BILITOT 0.9 0.8 0.6  PROT 6.7 5.7* 6.0*  ALBUMIN 2.2* 1.8* 1.9*   No results for input(s): LIPASE, AMYLASE in the last 168 hours. No results for input(s): AMMONIA in the last 168  hours. Coagulation Profile: Recent Labs  Lab December 16, 2020 1217 12/06/20 0405  INR 1.2 1.2   Cardiac Enzymes: No results for input(s): CKTOTAL, CKMB, CKMBINDEX, TROPONINI in the last 168 hours. BNP (last 3 results) No results for input(s): PROBNP in the last 8760 hours. HbA1C: No results for input(s): HGBA1C in the last 72 hours. CBG: Recent Labs  Lab 12/07/20 2349 12/08/20 0514 12/08/20 0624 12/08/20 0721 12/08/20 1328  GLUCAP 77 67* 69* 187* 94   Lipid Profile: No results for input(s): CHOL, HDL, LDLCALC, TRIG, CHOLHDL, LDLDIRECT in the last 72 hours. Thyroid Function Tests: Recent Labs    2020-12-16 1636  TSH 4.426   Anemia Panel: Recent Labs    12/07/20 1401  VITAMINB12 784  FOLATE 17.8   Sepsis Labs: Recent Labs  Lab 2020-12-16 1217 December 16, 2020 1521  LATICACIDVEN 1.0 1.0    Recent Results (from the past 240 hour(s))  Blood Culture (routine x 2)     Status: None (Preliminary result)   Collection Time: 12/16/20 12:17 PM   Specimen: BLOOD  Result Value Ref Range Status   Specimen Description BLOOD BLOOD RIGHT FOREARM  Final   Special Requests   Final    BOTTLES DRAWN AEROBIC AND ANAEROBIC Blood  Culture adequate volume   Culture   Final    NO GROWTH < 24 HOURS Performed at [facility], [address] [place], [place] [address]    Report Status PENDING  Incomplete  Blood Culture (routine x 2)     Status: None (Preliminary result)   Collection Time: December 16, 2020 12:45 PM   Specimen: BLOOD  Result Value Ref Range Status   Specimen Description BLOOD LEFT ANTECUBITAL  Final   Special Requests   Final    BOTTLES DRAWN AEROBIC AND ANAEROBIC Blood Culture results may not be optimal due to an inadequate volume of blood received in culture bottles   Culture   Final    NO GROWTH < 24 HOURS Performed at [facility], [address] [place], [place] [address]    Report Status PENDING  Incomplete  Urine culture     Status: None   Collection Time: 2020-12-16 12:57 PM   Specimen: In/Out Cath Urine  Result Value Ref Range Status   Specimen Description   Final    IN/OUT CATH URINE Performed at [facility], [address], [place], [place] [address]    Special Requests   Final    NONE Performed at [facility], [address] [place], [place] [address]    Culture   Final    NO GROWTH Performed at [facility], [address] [place], [place] [address]    Report Status 12/07/2020 FINAL  Final  Resp Panel by RT-PCR (Flu A&B, Covid) Nasopharyngeal Swab     Status: None   Collection Time: 2020-12-16  2:12 PM   Specimen: Nasopharyngeal Swab; Nasopharyngeal(NP) swabs in vial transport medium  Result Value Ref Range Status   SARS Coronavirus 2 by RT PCR NEGATIVE NEGATIVE Final    Comment: (NOTE) SARS-CoV-2 target nucleic acids are NOT DETECTED.  The SARS-CoV-2 RNA is generally detectable in upper respiratory specimens during the acute phase of infection. The lowest concentration of SARS-CoV-2 viral copies this assay can detect is 138 copies/mL. A negative result does not preclude SARS-Cov-2 infection and should not be used as the sole basis for treatment or other patient management  decisions. A negative result may occur with  improper specimen collection/handling, submission of specimen other than nasopharyngeal swab,  presence of viral mutation(s) within the areas targeted by this assay, and inadequate number of viral copies(<138 copies/mL). A negative result must be combined with clinical observations, patient history, and epidemiological information. The expected result is Negative.  Fact Sheet for Patients:  BloggerCourse.com  Fact Sheet for Healthcare Providers:  SeriousBroker.it  This test is no t yet approved or cleared by the [place] FDA and  has been authorized for detection and/or diagnosis of SARS-CoV-2 by FDA under an Emergency Use Authorization (EUA). This EUA will remain  in effect (meaning this test can be used) for the duration of the COVID-19 declaration under Section 564(b)(1) of the Act, 21 U.S.C.section 360bbb-3(b)(1), unless the authorization is terminated  or revoked sooner.       Influenza A by PCR NEGATIVE NEGATIVE Final   Influenza B by PCR NEGATIVE NEGATIVE Final    Comment: (NOTE) The Xpert Xpress SARS-CoV-2/FLU/RSV plus assay is intended as an aid in the diagnosis of influenza from Nasopharyngeal swab specimens and should not be used as a sole basis for treatment. Nasal washings and aspirates are unacceptable for Xpert Xpress SARS-CoV-2/FLU/RSV testing.  Fact Sheet for Patients: BloggerCourse.com  Fact Sheet for Healthcare Providers: SeriousBroker.it  This test is not yet approved or cleared by the [place] FDA and has been authorized for detection and/or diagnosis of SARS-CoV-2 by FDA under an Emergency Use Authorization (EUA). This EUA will remain in effect (meaning this test can be used) for the duration of the COVID-19 declaration under Section 564(b)(1) of the Act, 21 U.S.C. section 360bbb-3(b)(1), unless the  authorization is terminated or revoked.  Performed at [facility], [address], [place], [place] [address]   C Difficile Quick Screen w PCR reflex     Status: Abnormal   Collection Time: 12/06/20  4:00 PM   Specimen: STOOL  Result Value Ref Range Status   C Diff antigen POSITIVE (A) NEGATIVE Final   C Diff toxin POSITIVE (A) NEGATIVE Final    Comment: CRITICAL RESULT CALLED TO, READ BACK BY AND VERIFIED WITH: CARA LINGLER@1730  12/06/20 BY JONES,T    C Diff interpretation Toxin producing C. difficile detected.  Final    Comment: CRITICAL RESULT CALLED TO, READ BACK BY AND VERIFIED WITH: CARA LINGER  12/06/20 BY JONES,T Performed at [facility], [address], [place], [place] [address]          Radiology Studies: MR BRAIN WO CONTRAST  Result Date: 12/07/2020 CLINICAL DATA:  Acute neuro deficit. EXAM: MRI HEAD WITHOUT CONTRAST TECHNIQUE: Multiplanar, multiecho pulse sequences of the brain and surrounding structures were obtained without intravenous contrast. COMPARISON:  CT head 12-11-20.  CT angio head and neck 09/26/2020 FINDINGS: Brain: Negative for acute infarct. Mild white matter changes bilaterally. Small chronic infarct left frontal lobe over the convexity. Negative for hemorrhage or mass. Moderate atrophy without hydrocephalus. Vascular: Normal arterial flow voids Skull and upper cervical spine: No focal skeletal lesion. Cervical spondylosis with bony sclerosis as noted on the prior CTA neck. Sinuses/Orbits: Mild mucosal edema paranasal sinuses. Negative orbit. Other: None IMPRESSION: Moderate atrophy.  Small chronic infarct left frontal lobe. Negative for acute infarct. Electronically Signed   By: Marlan Palau M.D.   On: 12/07/2020 10:58   MR CERVICAL SPINE W WO CONTRAST  Result Date: 12/08/2020 CLINICAL DATA:  Bilateral upper extremity and lower extremity weakness. The patient suffered a fall approximately 4 weeks ago. EXAM: MRI CERVICAL SPINE WITHOUT AND WITH  CONTRAST TECHNIQUE: Multiplanar and multiecho pulse sequences of the  cervical spine, to include the craniocervical junction and cervicothoracic junction, were obtained without and with intravenous contrast. CONTRAST:  5 mL GADAVIST IV SOLN COMPARISON:  CT angiogram of the neck 09/27/2019. FINDINGS: Alignment: There is mild reversal of the normal cervical lordosis. Vertebrae: No fracture, evidence of discitis, or bone lesion. Degenerative endplate signal change is seen from C3-4 to C6-7, worst at C3-4. Cord: Edema without enhancement is seen in the cervical cord from approximately the inferior endplate of C3 is through the C4-5 level. Posterior Fossa, vertebral arteries, paraspinal tissues: Negative. Disc levels: C2-3: Disc bulge effaces the ventral thecal sac but the central canal and foramina appear open. C3-4: Disc bulge, uncovertebral disease and left much worse than right facet arthropathy. There is severe central canal and bilateral foraminal stenosis. The cord is markedly deformed. C4-5: Disc bulge, uncovertebral disease and facet arthropathy on the left. There is severe central canal and bilateral foraminal narrowing. The cord is severely flattened. C5-6: Disc bulge and right worse than left uncovertebral disease. There is also moderate bilateral facet arthropathy. The ventral thecal sac is nearly effaced. Moderately severe to severe foraminal narrowing is worse on the right. C6-7: Disc bulge and uncovertebral spurring. Bilateral facet arthropathy. The central canal is open. Severe bilateral foraminal narrowing. C7-T1: Negative. IMPRESSION: Severe central canal and bilateral foraminal stenosis at C3-4 and C4-5. 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 Moderately severe to severe bilateral foraminal narrowing at C5-6 is worse on the right. The ventral thecal sac is nearly effaced by  disc at this level. Severe bilateral foraminal narrowing at C6-7. The central canal is open at this level. These results were called by telephone at the time of interpretation on 12/08/2020 at 12:31 pm to provider [facility] , who verbally acknowledged these results. Electronically Signed   By: Drusilla Kanner M.D.   On: 12/08/2020 12:35        Scheduled Meds: . aspirin EC  81 mg Oral Daily  . atorvastatin  40 mg Oral Daily  . Chlorhexidine Gluconate Cloth  6 each Topical Daily  . dexamethasone (DECADRON) injection  8 mg Intravenous Q6H  . dextrose      . feeding supplement  237 mL Oral BID BM  . folic acid  1 mg Oral Daily  . metoprolol tartrate  25 mg Oral BID  . vancomycin  125 mg Oral QID   Continuous Infusions: . dextrose 5 % and 0.9% NaCl 125 mL/hr at 12/08/20 1024     LOS: 3 days    Time spent: 35 minutes    Dorcas Carrow, MD [facility] Pager [phone]

## 2020-12-08 NOTE — Progress Notes (Signed)
  NEUROSURGERY PROGRESS NOTE   Received call from Dr Jerral Ralph up at [facility] regarding patient. 69 year old male with history of left MCA stroke 10/21 with resultant right hemiparesis, HTN, gastric AVM, recently treated for UTI with Keflex who presented to ED with FTT, diarrhea meeting SIRs criteria, C dif positive. He was treated with antibiotics and IVF. While overall condition is improving, he remains decondition and weak in all extremities, R>L, very pronounced in BLE. He underwent an MRI C spine which showed several cervical stenosis at C3-4 and C4-5 with associated cord edema. By report, patient fell 4 weeks ago. Suspect patient fell and suffered cord injury as a result of the fall. He will need to undergo C4 corpectomy with plating from C3-5. With his fall being 4 weeks ago, decompression does not need to be done emergently. I discussed with Dr Jerral Ralph regarding transfer and lack of bed availability currently. Plan is to get patient here by Sunday for possible decompression Monday/Tuesday. Would make NPO Sunday at midnight and hold all blood thinning medications. Will check OR schedule and update Monday am.  Cindra Presume, [facility]

## 2020-12-08 NOTE — Progress Notes (Addendum)
Modified Barium Swallow Progress Note  Patient Details  Name: Stanley Scott MRN: [id number] Date of Birth: 05-04-1951  Today's Date: 12/08/2020  Modified Barium Swallow completed.  Full report located under Chart Review in the Imaging Section.  Brief recommendations include the following:  Clinical Impression  MBSS completed. Pt presents with moderate oropharyngeal dysphagia characterized by reduced labial closure, impaired mastication, with mildly prolonged oral phase, min delay in swallow initiation with swallow trigger at the level of the valleculae and pyriforms with liquids, reduced tongue base retraction, and reduced epiglottic deflection resulting in moderate vallecular residue, penetration of liquids during and after the swallow without witnessed aspiration, and eventual pooling of vallecular residuals into pyriforms. Pt's epiglottis appeared thick and bulbous at the base which became coated with initial tsp presentation of thins. Posterior pharyngeal wall slightly thick as well. It is possible that Pt had puree in valleculae from previously presented medications in puree, however it may be prudent to obtain CT neck (unless tissure abnormalities can be seen from MRI he just had). Pt with significant vallecular residuals with all textures and consistencies. Chin tuck and head turns were trialed and only the chin tuck was effective and only with puree and solids (no benefit with liquids). Pt cued to implement chin to chest when swallowing puree and mech soft and this facilitated clearance during the swallow in the pharynx. Pt unable to hold the cup himself due to BUE weakness, so he was assessed mostly with straws for liquids. There was no appreciable difference in NTL and thins, therefore recommend thin liquids via straw. Recommend D2 and thin liquids via straw sips when Pt is alert and upright, chin tuck with solids, cue to repeat/dry swallow, and clear throat with repeat swallow, po medications  can be presented crushed or whole in puree. Pt is at risk for aspiration of residuals after the swallow (valleculae), however this is a risk no matter the textures or consistencies. No aspiration observed today. Consider neck CT to evaluate epiglottis and posterior pharyngeal wall thickening. SLP will follow.   Swallow Evaluation Recommendations   Recommended Consults:  (Consider neck CT due to thickness at base of tongue/epiglottis and posterior pharyngeal wall.   SLP Diet Recommendations: Dysphagia 2 (Fine chop) solids;Thin liquid   Liquid Administration via: Straw   Medication Administration: Whole meds with puree   Supervision: Full assist for feeding;Full supervision/cueing for compensatory strategies   Compensations: Slow rate;Small sips/bites;Multiple dry swallows after each bite/sip;Clear throat intermittently;Effortful swallow;Chin tuck (chin tuck with solids)   Postural Changes: Remain semi-upright after after feeds/meals (Comment);Seated upright at 90 degrees   Oral Care Recommendations: Oral care BID;Staff/trained caregiver to provide oral care   Other Recommendations: Clarify dietary restrictions   Thank you,  Stanley Scott, CCC-SLP [phone]  Stanley Scott 12/08/2020,2:07 PM

## 2020-12-08 NOTE — Consult Note (Signed)
Consultation Note Date: 12/08/2020   Patient Name: Stanley Scott  DOB: 03/18/1951  MRN: [id number]  Age / Sex: 69 y.o., male  PCP: Neale Burly, MD Referring Physician: Barb Merino, MD  Reason for Consultation: Establishing goals of care and Psychosocial/spiritual support  HPI/Patient Profile: 69 y.o. 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 difficile diarrhea, failure to thrive.   Clinical Assessment and Goals of Care: I have reviewed medical records including EPIC notes, labs and imaging, received report from bedside nursing staff, examined the patient and met at bedside with Stanley Scott to discuss diagnosis prognosis, [place], EOL wishes, disposition and options.  Stanley Scott is lying quietly in bed with bedside nursing staff giving medicines.  He will make an somewhat keep eye contact.  He appears acutely/chronically ill and quite frail.  He is alert, oriented to person place and situation, able to make his basic needs known.  There is no family at bedside at this time.  I introduced Palliative Medicine as specialized medical care for people living with serious illness. It focuses on providing relief from the symptoms and stress of a serious illness.   We discussed a brief life review of the patient.  He tells me that he is unmarried.  He had 3 sons, but 1 died.  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  He tells me that his brother dead.  As far as functional and nutritional status he had been relatively independent until stroke in October 2021.  We discussed current illness and what it means in the larger context of  on-going co-morbidities.  Natural disease trajectory and expectations at EOL were discussed.  I attempted to elicit values and goals of care important to the patient.  At this point Stanley Scott is agreeable to further testing for diagnostic and treatment purposes and short-term rehab,  Advanced directives, concepts specific to code status, were considered and discussed.  Stanley Scott shares that he has not considered these choices.  I encouraged him to think about what matters to him, what he does want, what he does not want.  I share that outpatient palliative services can continue these discussions with both he and his family.  Palliative Care services outpatient were explained and offered.  Stanley Scott and his family would benefit from outpatient palliative services to continue goals of care discussions.  He he is agreeable to outpatient palliative services at this time. Questions and concerns were addressed.  The family was encouraged to call with questions or concerns.    Conference with attending, bedside nursing staff, transition of care team related to patient condition, needs, goals of care, outpatient palliative to follow.    HCPOA   NEXT OF KIN -Stanley Scott tells me that he would like his 2  sons, Stanley Scott and Stanley Scott to be his healthcare surrogates.  He requested that they work as a Therapist, occupational for decision-making.    SUMMARY OF RECOMMENDATIONS   At this point continue to treat the treatable. Agreeable to further testing as needed for treatment Agreeable to short-term rehab Considering CODE STATUS Outpatient palliative to follow   Code Status/Advance Care Planning:  Full code -we talked about "treat the treatable, but allowing natural death".  I encourage Mr. Cifelli to consider what matters to him.  Symptom Management:   Per hospitalist, no additional needs at this time.  Palliative Prophylaxis:   Oral Care and Turn Reposition  Additional Recommendations (Limitations,  Scope, Preferences):  Full Scope Treatment  Psycho-social/Spiritual:   Desire for further Chaplaincy support:no  Additional Recommendations: Caregiving  Support/Resources and Education on Hospice  Prognosis:   Unable to determine, based on outcomes.  6 months or less would not be surprising based on relatively fast functional decline, frailty, failure to thrive, downtrend in albumin from 2.4 December 17th to 1.9 December 29.  Discharge Planning: Agreeable to short-term rehab, but complicated by co-pay days.      Primary Diagnoses: Present on Admission: . Acute diarrhea   I have reviewed the medical record, interviewed the patient and family, and examined the patient. The following aspects are pertinent.  History reviewed. No pertinent past medical history. Social History   Socioeconomic History  . Marital status: Widowed    Spouse name: Not on file  . Number of children: Not on file  . Years of education: Not on file  . Highest education level: Not on file  Occupational History  . Not on file  Tobacco Use  . Smoking status: Never Smoker  . Smokeless tobacco: Never Used  Vaping Use  . Vaping Use: Never used  Substance and Sexual Activity  . Alcohol use: Not Currently  . Drug use: Never  . Sexual activity: Not on file  Other Topics Concern  . Not on file  Social History Narrative  . Not on file   Social Determinants of Health   Financial Resource Strain: Not on file  Food Insecurity: Not on file  Transportation Needs: Not on file  Physical Activity: Not on file  Stress: Not on file  Social Connections: Not on file   History reviewed. No pertinent family history. Scheduled Meds: . aspirin EC  81 mg Oral Daily  . atorvastatin  40 mg Oral Daily  . Chlorhexidine Gluconate Cloth  6 each Topical Daily  . dextrose      . feeding supplement  237 mL Oral BID BM  . folic acid  1 mg Oral Daily  . metoprolol tartrate  25 mg Oral BID  . vancomycin  125 mg Oral QID    Continuous Infusions: . dextrose 5 % and 0.9% NaCl 125 mL/hr at 12/08/20 1024   PRN Meds:.acetaminophen, ondansetron (ZOFRAN) IV Medications Prior to Admission:  Prior to Admission medications   Medication Sig Start Date End Date Taking? Authorizing Provider  acetaminophen (TYLENOL) 500 MG tablet Take 500 mg by mouth every 6 (six) hours as needed for moderate pain or headache.   Yes [provider]  atorvastatin (LIPITOR) 40 MG tablet Take 40 mg by mouth daily. 10/08/20  Yes [provider]  cephALEXin (KEFLEX) 500 MG capsule Take 1 capsule (500 mg total) by mouth 4 (four) times daily. 11/25/20  Yes Fredia Sorrow, MD  ergocalciferol (VITAMIN D2) 1.25 MG (50000 UT) capsule Take 50,000 Units by mouth once  a week. 10/08/20  Yes [provider]  folic acid (FOLVITE) 1 MG tablet Take 1 mg by mouth daily. 10/08/20  Yes [provider]  metoprolol tartrate (LOPRESSOR) 50 MG tablet Take 50 mg by mouth 2 (two) times daily. 11/30/20  Yes [provider]  pantoprazole (PROTONIX) 40 MG tablet Take 40 mg by mouth daily. 10/08/20  Yes [provider]  sodium chloride 1 g tablet Take 1 g by mouth 2 (two) times daily with a meal. 10/08/20  Yes [provider]  thiamine 100 MG tablet Take 100 mg by mouth daily. Patient not taking: No sig reported 10/08/20   [provider]   Not on File Review of Systems  Unable to perform ROS: Acuity of condition    Physical Exam Constitutional:      General: He is not in acute distress.    Appearance: He is ill-appearing.  HENT:     Head: Normocephalic and atraumatic.     Mouth/Throat:     Mouth: Mucous membranes are dry.  Cardiovascular:     Rate and Rhythm: Normal rate.  Pulmonary:     Effort: Pulmonary effort is normal. No respiratory distress.  Skin:    General: Skin is warm and dry.  Neurological:     Mental Status: He is alert.  Psychiatric:        Mood and Affect: Mood  normal.        Behavior: Behavior normal.     Vital Signs: BP 123/79 (BP Location: Left Arm)   Pulse (!) 102   Temp (!) 97.5 F (36.4 C)   Resp 18   Ht _0  (1.651 m)   Wt 52.8 kg   SpO2 97%   BMI 19.37 kg/m  Pain Scale: 0-10   Pain Score: 0-No pain   SpO2: SpO2: 97 % O2 Device:SpO2: 97 % O2 Flow Rate: .   IO: Intake/output summary:   Intake/Output Summary (Last 24 hours) at 12/08/2020 1059 Last data filed at 12/08/2020 0100 Gross per 24 hour  Intake 240 ml  Output 100 ml  Net 140 ml    LBM: Last BM Date: 12/06/20 Baseline Weight: Weight: 50.8 kg Most recent weight: Weight: 52.8 kg     Palliative Assessment/Data:   Flowsheet Rows   Flowsheet Row Most Recent Value  Intake Tab   Referral Department Hospitalist  Unit at Time of Referral Cardiac/Telemetry Unit  Palliative Care Primary Diagnosis Sepsis/Infectious Disease  Date Notified 12/08/20  Palliative Care Type New Palliative care  Reason for referral Clarify Goals of Care  Date of Admission 11/16/2020  Date first seen by Palliative Care 12/08/20  # of days Palliative referral response time 0 Day(s)  # of days IP prior to Palliative referral 3  Clinical Assessment   Palliative Performance Scale Score 20%  Pain Max last 24 hours Not able to report  Pain Min Last 24 hours Not able to report  Dyspnea Max Last 24 Hours Not able to report  Dyspnea Min Last 24 hours Not able to report  Psychosocial & Spiritual Assessment   Palliative Care Outcomes       Time In: 1000 Time Out: 1110 Time Total: 70 minutes  Greater than 50%  of this time was spent counseling and coordinating care related to the above assessment and plan.  Signed by: Drue Novel, NP   Please contact Palliative Medicine Team phone at [phone] for questions and concerns.  For individual provider: See Shea Evans

## 2020-12-09 DIAGNOSIS — R197 Diarrhea, unspecified: Secondary | ICD-10-CM | POA: Diagnosis not present

## 2020-12-09 LAB — GLUCOSE, CAPILLARY
Glucose-Capillary: 167 mg/dL — ABNORMAL HIGH (ref 70–99)
Glucose-Capillary: 179 mg/dL — ABNORMAL HIGH (ref 70–99)
Glucose-Capillary: 184 mg/dL — ABNORMAL HIGH (ref 70–99)
Glucose-Capillary: 216 mg/dL — ABNORMAL HIGH (ref 70–99)

## 2020-12-09 NOTE — Progress Notes (Signed)
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  In the emergency room, responded to IV fluid hydration.  Stool tested positive for C. difficile.  Started on oral vancomycin.  Remains extremely debilitated.  12/29 ,noted to be more weak on both sides.  A stat MRI head is negative for acute stroke. 12/30, with persistent quadriparesis, MRI of the cervical spine was done that shows severe cervical stenosis and spinal cord edema.  Started on steroids.  Discussed with neurosurgery and initiated transfer to [facility] for possible C-spine decompression.   Assessment & Plan:   Principal Problem:   Acute diarrhea Active Problems:   Generalized weakness   Hypothermia   Failure to thrive in adult   Hypotension   Symptomatic anemia   Hypoalbuminemia   Prolonged QT interval   Gastric AVM   CVA (cerebral vascular accident) (HCC)   SIRS (systemic inflammatory response syndrome) (HCC)   Hypokalemia   Essential hypertension   Pressure injury of skin   Protein-calorie malnutrition, severe  C. difficile diarrhea: Clinically improving.  Diarrhea improved.  On oral vancomycin.  Day 4/10.  Post episode.  Physical debility/deconditioning with recent history of stroke and hemiparesis: Severe cervical spine disease/C-spine compression: Functional quadriplegia: Remains is debilitated and deconditioned due to multiple medical issues. Continue to work with PT OT.  Will ultimately need rehab.   Repeat MRI negative  for acute stroke.  B12, folic acid levels normal. 12/30, MRI of the cervical spine shows severe cord compression, spinal cord edema.  Patient reported fall about 4 weeks ago.  Probably subacute cord compression. Case discussed with neurosurgery, Dr. Conchita ParisNundkumar. started on IV steroids. Transfer to [facility] for possible decompression of the cervical spine, tentatively planned for 12/12/2020. He has some response to steroids today and able to lift up his legs against gravity.  Electrolyte abnormalities, hypokalemia/hypomagnesemia: With poor oral intake.  Replaced aggressively.  Levels improved.  Recheck tomorrow morning.  History of a stroke: No new stroke.  On aspirin and statin.  PT OT and speech continue to see him.  Failure to thrive/severe protein calorie malnutrition: Augment nutrition.  Calorie count.  Hypertension: On metoprolol.  Stable.  Anemia of chronic disease: Hemoglobin 7.2.  Transfuse 2 units of PRBC.  Hemoccult negative.  Responded appropriately.  Recheck hemoglobin tomorrow. Signs/Symptoms: moderate fat depletion,severe fat depletion,moderate muscle depletion,severe muscle depletion,percent weight loss Percent weight loss: 7.4 % (9 lbs x 6 weeks) Interventions: MVI,Magic cup,Ensure Enlive (each supplement provides 350kcal and 20 grams of protein)  With MRI cervical spine results, case discussed with neurosurgery Dr. Conchita ParisNundkumar.  Advised transfer to [facility] for formal evaluation.  Neurosurgery team thinks he will benefit with C-spine surgery if he is able to tolerate.  They will evaluate him once he arrives here.  DVT prophylaxis: SCDs Start: 05/05/20 1913   Code Status: Full code Family Communication: Patient's son called and updated. Disposition Plan: Status is: Inpatient  Remains inpatient appropriate because:Inpatient level of care appropriate due to severity of illness   Dispo: The patient is from: Home  Anticipated d/c is to: SNF               Anticipated d/c date is: Unknown at this time.              Patient currently is not medically stable to d/c.  Pending transfer to [facility].       Consultants:   Neurosurgery, will be seen once patient is at [facility].  Procedures:   None  Antimicrobials:   Vancomycin oral, 12/27---   Subjective: Patient seen and examined.  Today he was more alert and communicating.  No overnight events.  Still has difficulty moving, however he was able to lift up his legs today.  Objective: Vitals:   12/08/20 0621 12/08/20 1400 12/08/20 2030 12/09/20 0614  BP: 123/79 (!) 84/63 96/62 132/77  Pulse: (!) 102 79 100 91  Resp: 18 20 18 18   Temp: (!) 97.5 F (36.4 C) (!) 97.4 F (36.3 C) (!) 97.5 F (36.4 C) 97.9 F (36.6 C)  TempSrc:  Oral  Oral  SpO2: 97% 100% 99% 100%  Weight:      Height:        Intake/Output Summary (Last 24 hours) at 12/09/2020 1149 Last data filed at 12/09/2020 12/11/2020 Gross per 24 hour  Intake 1000 ml  Output 1250 ml  Net -250 ml   Filed Weights   12/07/20 1847 12/08/20 0500  Weight: 50.8 kg 52.8 kg    Examination:  General exam: Chronically sick looking, frail and debilitated gentleman lying in bed. Respiratory system: Clear to auscultation. Respiratory effort normal.  No added sounds. Cardiovascular system: S1 & S2 heard, RRR. No JVD, murmurs, rubs, gallops or clicks. No pedal edema. Gastrointestinal system: Soft and nontender.. Central nervous system: Alert and oriented.  All extremities are weak and upper extremities power 4/5 bilateral.   He is somehow able to move his legs today against gravity.  Bilateral 3/5. Psychiatry: Judgement and insight appear normal. Mood & affect flat.    Data Reviewed: I have personally reviewed following labs and imaging studies  CBC: Recent Labs  Lab 12/06/2020 1217 12/06/20 0405 12/08/20 0433  WBC 7.2 9.0 7.7  NEUTROABS 5.6  --  6.5  HGB 7.2* 8.6* 9.5*  HCT 23.7* 27.2* 30.8*  MCV 93.7 90.1 91.7   PLT 271 242 247   Basic Metabolic Panel: Recent Labs  Lab 11/22/2020 1217 11/25/2020 1554 12/06/20 0405 12/07/20 1401  NA 135  --  136 136  K 2.7*  --  3.5 3.8  CL 100  --  103 105  CO2 21*  --  21* 21*  GLUCOSE 99  --  70 102*  BUN 15  --  9 10  CREATININE 0.66  --  0.48* 0.47*  CALCIUM 8.5*  --  7.9* 8.2*  MG  --  1.7 1.9 1.9  PHOS  --   --  4.3 4.4   GFR: Estimated Creatinine Clearance: 65.1 mL/min (A) (by C-G formula based on SCr of 0.47 mg/dL (L)). Liver Function Tests: Recent Labs  Lab 12/04/2020 1217 12/06/20 0405 12/07/20 1401  AST 24 21 48*  ALT 9 9 13   ALKPHOS 118 100 100  BILITOT 0.9 0.8 0.6  PROT 6.7 5.7* 6.0*  ALBUMIN 2.2* 1.8* 1.9*   No results for input(s): LIPASE, AMYLASE in the last 168 hours. No results for input(s): AMMONIA in the last 168 hours. Coagulation Profile: Recent Labs  Lab 11/30/2020 1217 12/06/20 0405  INR 1.2 1.2   Cardiac  Enzymes: No results for input(s): CKTOTAL, CKMB, CKMBINDEX, TROPONINI in the last 168 hours. BNP (last 3 results) No results for input(s): PROBNP in the last 8760 hours. HbA1C: No results for input(s): HGBA1C in the last 72 hours. CBG: Recent Labs  Lab 12/08/20 1639 12/08/20 2302 12/09/20 0013 12/09/20 0612 12/09/20 1146  GLUCAP 125* 212* 216* 179* 167*   Lipid Profile: No results for input(s): CHOL, HDL, LDLCALC, TRIG, CHOLHDL, LDLDIRECT in the last 72 hours. Thyroid Function Tests: No results for input(s): TSH, T4TOTAL, FREET4, T3FREE, THYROIDAB in the last 72 hours. Anemia Panel: Recent Labs    12/07/20 1401  VITAMINB12 784  FOLATE 17.8   Sepsis Labs: Recent Labs  Lab 11/17/2020 1217 11/22/2020 1521  LATICACIDVEN 1.0 1.0    Recent Results (from the past 240 hour(s))  Blood Culture (routine x 2)     Status: None (Preliminary result)   Collection Time: 11/25/2020 12:17 PM   Specimen: BLOOD  Result Value Ref Range Status   Specimen Description BLOOD BLOOD RIGHT FOREARM  Final   Special Requests    Final    BOTTLES DRAWN AEROBIC AND ANAEROBIC Blood Culture adequate volume   Culture   Final    NO GROWTH 4 DAYS Performed at [facility], [address], [place], [place] [address]    Report Status PENDING  Incomplete  Blood Culture (routine x 2)     Status: None (Preliminary result)   Collection Time: 11/23/2020 12:45 PM   Specimen: BLOOD  Result Value Ref Range Status   Specimen Description BLOOD LEFT ANTECUBITAL  Final   Special Requests   Final    BOTTLES DRAWN AEROBIC AND ANAEROBIC Blood Culture results may not be optimal due to an inadequate volume of blood received in culture bottles   Culture   Final    NO GROWTH 4 DAYS Performed at [facility], [address] [place], [place] [address]    Report Status PENDING  Incomplete  Urine culture     Status: None   Collection Time: 11/09/2020 12:57 PM   Specimen: In/Out Cath Urine  Result Value Ref Range Status   Specimen Description   Final    IN/OUT CATH URINE Performed at [facility], [address] [place], [place] [address]    Special Requests   Final    NONE Performed at [facility], [address], [place], [place] [address]    Culture   Final    NO GROWTH Performed at [facility], [address], [place], [place] [address]    Report Status 12/07/2020 FINAL  Final  Resp Panel by RT-PCR (Flu A&B, Covid) Nasopharyngeal Swab     Status: None   Collection Time: 11/15/2020  2:12 PM   Specimen: Nasopharyngeal Swab; Nasopharyngeal(NP) swabs in vial transport medium  Result Value Ref Range Status   SARS Coronavirus 2 by RT PCR NEGATIVE NEGATIVE Final    Comment: (NOTE) SARS-CoV-2 target nucleic acids are NOT DETECTED.  The SARS-CoV-2 RNA is generally detectable in upper respiratory specimens during the acute phase of infection. The lowest concentration of SARS-CoV-2 viral copies this assay can detect is 138 copies/mL. A negative result does not preclude SARS-Cov-2 infection and should not be used as the sole basis  for treatment or other patient management decisions. A negative result may occur with  improper specimen collection/handling, submission of specimen other than nasopharyngeal swab, presence of viral mutation(s) within the areas targeted by this assay, and inadequate number of viral copies(<138 copies/mL).  A negative result must be combined with clinical observations, patient history, and epidemiological information. The expected result is Negative.  Fact Sheet for Patients:  BloggerCourse.com  Fact Sheet for Healthcare Providers:  SeriousBroker.it  This test is no t yet approved or cleared by the [place] FDA and  has been authorized for detection and/or diagnosis of SARS-CoV-2 by FDA under an Emergency Use Authorization (EUA). This EUA will remain  in effect (meaning this test can be used) for the duration of the COVID-19 declaration under Section 564(b)(1) of the Act, 21 U.S.C.section 360bbb-3(b)(1), unless the authorization is terminated  or revoked sooner.       Influenza A by PCR NEGATIVE NEGATIVE Final   Influenza B by PCR NEGATIVE NEGATIVE Final    Comment: (NOTE) The Xpert Xpress SARS-CoV-2/FLU/RSV plus assay is intended as an aid in the diagnosis of influenza from Nasopharyngeal swab specimens and should not be used as a sole basis for treatment. Nasal washings and aspirates are unacceptable for Xpert Xpress SARS-CoV-2/FLU/RSV testing.  Fact Sheet for Patients: BloggerCourse.com  Fact Sheet for Healthcare Providers: SeriousBroker.it  This test is not yet approved or cleared by the [place] FDA and has been authorized for detection and/or diagnosis of SARS-CoV-2 by FDA under an Emergency Use Authorization (EUA). This EUA will remain in effect (meaning this test can be used) for the duration of the COVID-19 declaration under Section 564(b)(1) of the Act, 21  U.S.C. section 360bbb-3(b)(1), unless the authorization is terminated or revoked.  Performed at [facility], [address] [place], [place] [address]   C Difficile Quick Screen w PCR reflex     Status: Abnormal   Collection Time: 12/06/20  4:00 PM   Specimen: STOOL  Result Value Ref Range Status   C Diff antigen POSITIVE (A) NEGATIVE Final   C Diff toxin POSITIVE (A) NEGATIVE Final    Comment: CRITICAL RESULT CALLED TO, READ BACK BY AND VERIFIED WITH: CARA LINGLER@1730  12/06/20 BY JONES,T    C Diff interpretation Toxin producing C. difficile detected.  Final    Comment: CRITICAL RESULT CALLED TO, READ BACK BY AND VERIFIED WITH: CARA LINGER @1730  12/06/20 BY JONES,T Performed at [facility], [address] [place], [place]          Radiology Studies: MR CERVICAL SPINE W WO CONTRAST  Result Date: 12/08/2020 CLINICAL DATA:  Bilateral upper extremity and lower extremity weakness. The patient suffered a fall approximately 4 weeks ago. EXAM: MRI CERVICAL SPINE WITHOUT AND WITH CONTRAST TECHNIQUE: Multiplanar and multiecho pulse sequences of the cervical spine, to include the craniocervical junction and cervicothoracic junction, were obtained without and with intravenous contrast. CONTRAST:  5 mL GADAVIST IV SOLN COMPARISON:  CT angiogram of the neck 09/27/2019. FINDINGS: Alignment: There is mild reversal of the normal cervical lordosis. Vertebrae: No fracture, evidence of discitis, or bone lesion. Degenerative endplate signal change is seen from C3-4 to C6-7, worst at C3-4. Cord: Edema without enhancement is seen in the cervical cord from approximately the inferior endplate of C3 is through the C4-5 level. Posterior Fossa, vertebral arteries, paraspinal tissues: Negative. Disc levels: C2-3: Disc bulge effaces the ventral thecal sac but the central canal and foramina appear open. C3-4: Disc bulge, uncovertebral disease and left much worse than right facet arthropathy. There is  severe central canal and bilateral foraminal stenosis. The cord is markedly deformed. C4-5: Disc bulge, uncovertebral disease and facet arthropathy on the left. There is severe central canal and bilateral foraminal narrowing. The  cord is severely flattened. C5-6: Disc bulge and right worse than left uncovertebral disease. There is also moderate bilateral facet arthropathy. The ventral thecal sac is nearly effaced. Moderately severe to severe foraminal narrowing is worse on the right. C6-7: Disc bulge and uncovertebral spurring. Bilateral facet arthropathy. The central canal is open. Severe bilateral foraminal narrowing. C7-T1: Negative. IMPRESSION: Severe central canal and bilateral foraminal stenosis at C3-4 and C4-5. 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 Moderately severe to severe bilateral foraminal narrowing at C5-6 is worse on the right. The ventral thecal sac is nearly effaced by disc at this level. Severe bilateral foraminal narrowing at C6-7. The central canal is open at this level. These results were called by telephone at the time of interpretation on 12/08/2020 at 12:31 pm to provider [facility] , who verbally acknowledged these results. Electronically Signed   By: Drusilla Kanner M.D.   On: 12/08/2020 12:35   DG Swallowing Func-Speech Pathology  Result Date: 12/08/2020 Objective Swallowing Evaluation: Type of Study: MBS-Modified Barium Swallow Study  Patient Details Name: Tanor Glaspy MRN: [id number] Date of Birth: 20-Mar-1951 Today's Date: 12/08/2020 Time: SLP Start Time (ACUTE ONLY): 1210 -SLP Stop Time (ACUTE ONLY): 1235 SLP Time Calculation (min) (ACUTE ONLY): 25 min Past Medical History: No past medical history on file. Past Surgical History: No past surgical history on file. HPI: Damone Fancher is a 69 y.o. 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  Patient was unable to provide history, history was obtained from ED PA and ED medical record.  Per report, patient was reported to have had several episodes of loose to watery stools daily for the past week.  He has also had poor appetite, though family encouraged oral hydration.  Patient sustained a fall by landing on his outstretched left upper extremity while being transferred from his bed to the bedside commode (around 2 AM today).  He complained of left thumb, forearm and shoulder pain, but denies head injury.  Patient was seen in the ED on 12/17 and was suspected to have acute cystitis without hematuria, he was discharged home with Keflex at that time.   Patient was reported to be sedentary at baseline and lives with son who provide significant assistance with ADLs.  He denies chest pain, shortness of breath, nausea, vomiting, abdominal pain.  Subjective: 'Ok" Assessment / Plan / Recommendation CHL IP CLINICAL IMPRESSIONS 12/08/2020 Clinical Impression MBSS completed. Pt presents with moderate oropharyngeal dysphagia characterized by reduced labial closure, impaired mastication, with mildly prolonged oral phase, min delay in swallow initiation with swallow trigger at the level of the valleculae and pyriforms with liquids, reduced tongue base retraction, and reduced epiglottic deflection resulting in moderate vallecular residue, penetration of liquids during and after the swallow without witnessed aspiration, and eventual pooling of vallecular residuals into pyriforms. Pt's epiglottis appeared thick and bulbous at the base which became coated with initial tsp presentation of thins. It is possible that Pt had puree in valleculae from previously presented medications in puree, however it may be  prudent to obtain CT neck (unless tissure abnormalities can be seen from MRI he just had). Pt with significant vallecular residuals with all textures and consistencies. Chin tuck and  head turns were trialed and only the chin tuck was effective and only with puree and solids (no benefit with liquids). Pt cued to implement chin to chest when swallowing puree and mech soft and this facilitated clearance during the swallow in the pharynx. Pt unable to hold the cup himself due to BUE weakness, so he was assessed mostly with straws for liquids. There was no appreciable difference in NTL and thins, therefore recommend thin liquids via straw. Recommend D2 and thin liquids via straw sips when Pt is alert and upright, chin tuck with solids, cue to repeat/dry swallow, and clear throat with repeat swallow, po medications can be presented crushed or whole in puree. Pt is at risk for aspiration of residuals after the swallow (valleculae), however this is a risk no matter the textures or consistencies. No aspiration observed today. Consider neck CT to evaluate epiglottis thickening. SLP will follow. SLP Visit Diagnosis Dysphagia, oropharyngeal phase (R13.12) Attention and concentration deficit following -- Frontal lobe and executive function deficit following -- Impact on safety and function Mild aspiration risk   CHL IP TREATMENT RECOMMENDATION 12/08/2020 Treatment Recommendations Therapy as outlined in treatment plan below   Prognosis 12/08/2020 Prognosis for Safe Diet Advancement Fair Barriers to Reach Goals Severity of deficits Barriers/Prognosis Comment -- CHL IP DIET RECOMMENDATION 12/08/2020 SLP Diet Recommendations Dysphagia 2 (Fine chop) solids;Thin liquid Liquid Administration via Straw Medication Administration Whole meds with puree Compensations Slow rate;Small sips/bites;Multiple dry swallows after each bite/sip;Clear throat intermittently;Effortful swallow;Chin tuck Postural Changes Remain semi-upright after after  feeds/meals (Comment);Seated upright at 90 degrees   CHL IP OTHER RECOMMENDATIONS 12/08/2020 Recommended Consults (No Data) Oral Care Recommendations Oral care BID;Staff/trained caregiver to provide oral care Other Recommendations Clarify dietary restrictions   CHL IP FOLLOW UP RECOMMENDATIONS 12/08/2020 Follow up Recommendations Skilled Nursing facility   [facility] IP FREQUENCY AND DURATION 12/08/2020 Speech Therapy Frequency (ACUTE ONLY) min 2x/week Treatment Duration 1 week      CHL IP ORAL PHASE 12/08/2020 Oral Phase Impaired Oral - Pudding Teaspoon -- Oral - Pudding Cup -- Oral - Honey Teaspoon -- Oral - Honey Cup -- Oral - Nectar Teaspoon -- Oral - Nectar Cup -- Oral - Nectar Straw Weak lingual manipulation Oral - Thin Teaspoon Left anterior bolus loss;Right anterior bolus loss Oral - Thin Cup Left anterior bolus loss;Right anterior bolus loss Oral - Thin Straw WFL Oral - Puree (No Data) Oral - Mech Soft Impaired mastication Oral - Regular -- Oral - Multi-Consistency -- Oral - Pill Delayed oral transit;Reduced posterior propulsion Oral Phase - Comment --  CHL IP PHARYNGEAL PHASE 12/08/2020 Pharyngeal Phase Impaired Pharyngeal- Pudding Teaspoon -- Pharyngeal -- Pharyngeal- Pudding Cup -- Pharyngeal -- Pharyngeal- Honey Teaspoon -- Pharyngeal -- Pharyngeal- Honey Cup -- Pharyngeal -- Pharyngeal- Nectar Teaspoon -- Pharyngeal -- Pharyngeal- Nectar Cup -- Pharyngeal -- Pharyngeal- Nectar Straw Delayed swallow initiation-vallecula;Delayed swallow initiation-pyriform sinuses;Reduced epiglottic inversion;Reduced tongue base retraction;Penetration/Aspiration during swallow;Penetration/Apiration after swallow;Pharyngeal residue - valleculae;Pharyngeal residue - pyriform;Lateral channel residue Pharyngeal Material does not enter airway;Material enters airway, remains ABOVE vocal cords then ejected out;Material enters airway, remains ABOVE vocal cords and not ejected out Pharyngeal- Thin Teaspoon Delayed swallow  initiation-vallecula;Reduced epiglottic inversion;Reduced tongue base retraction;Penetration/Aspiration during swallow;Penetration/Apiration after swallow;Pharyngeal residue - valleculae Pharyngeal Material does not enter airway;Material enters airway, remains ABOVE vocal cords then ejected out Pharyngeal- Thin Cup Delayed swallow initiation-pyriform sinuses;Reduced epiglottic inversion;Reduced tongue base retraction;Penetration/Aspiration during swallow;Penetration/Apiration after swallow;Pharyngeal residue - valleculae;Pharyngeal residue - pyriform;Lateral channel residue Pharyngeal Material enters airway, remains ABOVE vocal cords  then ejected out;Material enters airway, remains ABOVE vocal cords and not ejected out;Material does not enter airway Pharyngeal- Thin Straw Delayed swallow initiation-pyriform sinuses;Reduced epiglottic inversion;Penetration/Aspiration during swallow;Penetration/Apiration after swallow;Pharyngeal residue - valleculae;Pharyngeal residue - pyriform;Lateral channel residue Pharyngeal Material does not enter airway;Material enters airway, remains ABOVE vocal cords then ejected out;Material enters airway, remains ABOVE vocal cords and not ejected out Pharyngeal- Puree Delayed swallow initiation-vallecula;Reduced epiglottic inversion;Reduced tongue base retraction;Pharyngeal residue - valleculae Pharyngeal -- Pharyngeal- Mechanical Soft -- Pharyngeal -- Pharyngeal- Regular -- Pharyngeal -- Pharyngeal- Multi-consistency -- Pharyngeal -- Pharyngeal- Pill WFL Pharyngeal -- Pharyngeal Comment --  CHL IP CERVICAL ESOPHAGEAL PHASE 12/08/2020 Cervical Esophageal Phase WFL Pudding Teaspoon -- Pudding Cup -- Honey Teaspoon -- Honey Cup -- Nectar Teaspoon -- Nectar Cup -- Nectar Straw -- Thin Teaspoon -- Thin Cup -- Thin Straw -- Puree -- Mechanical Soft -- Regular -- Multi-consistency -- Pill -- Cervical Esophageal Comment -- Thank you, Havery Moros, CCC-SLP [phone] PORTER,DABNEY 12/08/2020,  2:08 PM                   Scheduled Meds: . aspirin EC  81 mg Oral Daily  . atorvastatin  40 mg Oral Daily  . Chlorhexidine Gluconate Cloth  6 each Topical Daily  . dexamethasone (DECADRON) injection  8 mg Intravenous Q6H  . feeding supplement  237 mL Oral BID BM  . folic acid  1 mg Oral Daily  . metoprolol tartrate  25 mg Oral BID  . multivitamin with minerals  1 tablet Oral Daily  . nutrition supplement (JUVEN)  1 packet Oral BID BM  . vancomycin  125 mg Oral QID   Continuous Infusions: . dextrose 5 % and 0.9% NaCl 125 mL/hr at 12/08/20 1024     LOS: 4 days    Time spent: 30 minutes    Dorcas Carrow, MD [facility] Pager [phone]

## 2020-12-09 NOTE — Progress Notes (Signed)
Calorie Count Note  48 hour calorie count ordered.  Due to holiday, RD will follow-up with Day 2 results on Sunday   Diet: DYS 2 Supplements: Ensure [employer], Juven  12/30 Dinner: 735 kcal, 31.5 grams protein (100% EE, 100% MC, 100% Juven) 12/31 Breakfast: 232 kcal, 13.9 grams protein (80% eggs, 80% sausage/gravy) 12/31 Lunch: 657.5 kcal, 29.4 grams protein (100% pot roast/gravy, 100% mashed pots, 95% carrots, 100% Magic Cup, 25% sweet tea)  12/31 Supplements: 890 kcal, 45 grams protein (100% EE x 2, 100% Juven x 2)  Day 1Total intake: 2515 kcal (132% of estimated needs)  120 protein (126% of estimated needs)  Nutrition Dx: Severe Malnutrition related to chronic illness as evidenced by moderate fat depletion,severe fat depletion,moderate muscle depletion,severe muscle depletion,percent weight loss.  Goal: Patient will meet greater than 90% of estimated needs  Intervention:  Ensure Enlive po BID, each supplement provides 350 kcal and 20 grams of protein  Magic cup BID with meals, each supplement provides 290 kcal and 9 grams of protein  Juven BID, each packet provides 95 calories, 2.5 grams of protein (collagen), and 9.8 grams of carbohydrate (3 grams sugar); also contains 7 grams of L-arginine and L-glutamine, 300 mg vitamin C, 15 mg vitamin E, 1.2 mcg vitamin B-12, 9.5 mg zinc, 200 mg calcium, and 1.5 g  Calcium Beta-hydroxy-Beta-methylbutyrate to support wound healing   Lars Masson, RD, LDN Clinical Nutrition After Hours/Weekend Pager # in Amion

## 2020-12-09 NOTE — Progress Notes (Signed)
  Speech Language Pathology Treatment: Dysphagia  Patient Details Name: Stanley Scott MRN: [id number] DOB: Nov 10, 1951 Today's Date: 12/09/2020 Time: 2440-1027 SLP Time Calculation (min) (ACUTE ONLY): 15 min  Assessment / Plan / Recommendation Clinical Impression  Diagnostic Dysphagia therapy provided while Pt was sitting upright in bed. Trials of mech soft mixed consistency (packaged peaches) and thin liquid trials provided. Pt demonstrated prolonged oral prep stage with mild oral residue cleared with verbal cues and liquid wash; with thin trials Pt demonstrated multiple swallows, suspected delayed swallow, wet vocal quality after the swallow and delayed throat clearing. This presentation is consistent with MBSS report yesterday. Continue recommended diet of D2/fine chop and thin liquids with meds whole in puree (or crushed as needed). Pt will benefit from ST f/u at d/c location, SNF recommended. ST will continue to follow acutely    HPI HPI: Stanley Scott is a 69 y.o. 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  Patient was unable to provide history, history was obtained from ED PA and ED medical record.  Per report, patient was reported to have had several episodes of loose to watery stools daily for the past week.  He has also had poor appetite, though family encouraged oral hydration.  Patient sustained a fall by landing on his outstretched left upper extremity while being transferred from his bed to the bedside commode (around 2 AM today).  He complained of left thumb, forearm and shoulder pain, but denies head injury.  Patient was seen in the ED on 12/17 and was suspected to have acute cystitis without hematuria, he was discharged home with Keflex at that time.   Patient was  reported to be sedentary at baseline and lives with son who provide significant assistance with ADLs.  He denies chest pain, shortness of breath, nausea, vomiting, abdominal pain.      SLP Plan  Continue with current plan of care       Recommendations  Diet recommendations: Dysphagia 2 (fine chop);Thin liquid Liquids provided via: Straw Medication Administration: Whole meds with puree Supervision: Full supervision/cueing for compensatory strategies Compensations: Slow rate;Small sips/bites;Multiple dry swallows after each bite/sip;Clear throat intermittently;Effortful swallow;Chin tuck Postural Changes and/or Swallow Maneuvers: Seated upright 90 degrees;Upright 30-60 min after meal                Oral Care Recommendations: Oral care BID Follow up Recommendations: Skilled Nursing facility SLP Visit Diagnosis: Dysphagia, oropharyngeal phase (R13.12) Plan: Continue with current plan of care                   Azarel Banner H. Romie Levee, CCC-SLP Speech Language Pathologist   Georgetta Haber 12/09/2020, 8:16 AM

## 2020-12-10 DIAGNOSIS — R197 Diarrhea, unspecified: Secondary | ICD-10-CM | POA: Diagnosis not present

## 2020-12-10 LAB — CULTURE, BLOOD (ROUTINE X 2)
Culture: NO GROWTH
Culture: NO GROWTH
Special Requests: ADEQUATE

## 2020-12-10 LAB — BASIC METABOLIC PANEL
Anion gap: 7 (ref 5–15)
Anion gap: 8 (ref 5–15)
BUN: 22 mg/dL (ref 8–23)
BUN: 20 mg/dL (ref 8–23)
CO2: 22 mmol/L (ref 22–32)
CO2: 20 mmol/L — ABNORMAL LOW (ref 22–32)
Calcium: 8.3 mg/dL — ABNORMAL LOW (ref 8.9–10.3)
Calcium: 8.3 mg/dL — ABNORMAL LOW (ref 8.9–10.3)
Chloride: 113 mmol/L — ABNORMAL HIGH (ref 98–111)
Chloride: 114 mmol/L — ABNORMAL HIGH (ref 98–111)
Creatinine, Ser: 0.48 mg/dL — ABNORMAL LOW (ref 0.61–1.24)
Creatinine, Ser: 0.59 mg/dL — ABNORMAL LOW (ref 0.61–1.24)
GFR, Estimated: >60 mL/min (ref >60)
GFR, Estimated: >60 mL/min (ref >60)
Glucose, Bld: 190 mg/dL — ABNORMAL HIGH (ref 70–99)
Glucose, Bld: 287 mg/dL — ABNORMAL HIGH (ref 70–99)
Potassium: 4 mmol/L (ref 3.5–5.1)
Potassium: 4.1 mmol/L (ref 3.5–5.1)
Sodium: 142 mmol/L (ref 135–145)
Sodium: 142 mmol/L (ref 135–145)

## 2020-12-10 LAB — MAGNESIUM
Magnesium: 1.8 mg/dL (ref 1.7–2.4)
Magnesium: 1.8 mg/dL (ref 1.7–2.4)

## 2020-12-10 LAB — CBC WITH DIFFERENTIAL/PLATELET
Abs Immature Granulocytes: 0.08 10*3/uL — ABNORMAL HIGH (ref 0.00–0.07)
Basophils Absolute: 0 10*3/uL (ref 0.0–0.1)
Basophils Relative: 0 %
Eosinophils Absolute: 0 10*3/uL (ref 0.0–0.5)
Eosinophils Relative: 0 %
HCT: 27 % — ABNORMAL LOW (ref 39.0–52.0)
Hemoglobin: 8 g/dL — ABNORMAL LOW (ref 13.0–17.0)
Immature Granulocytes: 2 %
Lymphocytes Relative: 8 %
Lymphs Abs: 0.4 10*3/uL — ABNORMAL LOW (ref 0.7–4.0)
MCH: 28 pg (ref 26.0–34.0)
MCHC: 29.6 g/dL — ABNORMAL LOW (ref 30.0–36.0)
MCV: 94.4 fL (ref 80.0–100.0)
Monocytes Absolute: 0.3 10*3/uL (ref 0.1–1.0)
Monocytes Relative: 5 %
Neutro Abs: 4.4 10*3/uL (ref 1.7–7.7)
Neutrophils Relative %: 85 %
Platelets: 224 10*3/uL (ref 150–400)
RBC: 2.86 MIL/uL — ABNORMAL LOW (ref 4.22–5.81)
RDW: 21.1 % — ABNORMAL HIGH (ref 11.5–15.5)
WBC: 5.1 10*3/uL (ref 4.0–10.5)
nRBC: 0.4 % — ABNORMAL HIGH (ref 0.0–0.2)

## 2020-12-10 LAB — GLUCOSE, CAPILLARY
Glucose-Capillary: 147 mg/dL — ABNORMAL HIGH (ref 70–99)
Glucose-Capillary: 156 mg/dL — ABNORMAL HIGH (ref 70–99)
Glucose-Capillary: 160 mg/dL — ABNORMAL HIGH (ref 70–99)
Glucose-Capillary: 177 mg/dL — ABNORMAL HIGH (ref 70–99)
Glucose-Capillary: 193 mg/dL — ABNORMAL HIGH (ref 70–99)
Glucose-Capillary: 214 mg/dL — ABNORMAL HIGH (ref 70–99)

## 2020-12-10 LAB — PHOSPHORUS: Phosphorus: 3.4 mg/dL (ref 2.5–4.6)

## 2020-12-10 NOTE — Progress Notes (Signed)
Patient being transferred to [facility] 18 C. Report called and given to Leonette Most RN. All questions were answered and no further questions at this time. Patient's son Demarrius Guerrero has been updated. Patient is agreeable to transfer and [facility] has been notified and report given to [facility]. Patient in stable condition and no acute distress at time of discharge.

## 2020-12-10 NOTE — Progress Notes (Signed)
PROGRESS NOTE    Stanley Scott  EHM:[id number] DOB: 11-25-1951 DOA: 11/28/2020 PCP: Neale Burly, MD    Brief Narrative:  70 year old gentleman with history of recent left MCA stroke on 10/21 with residual right hemiparesis, left carotid stenosis status post carotid endarterectomy, treated with tPA for stroke, hypertension, history of GI bleeding from gastric AVM who was recently treated with Keflex for UTI and came back to the emergency room with multiple loose stools, electrolyte abnormalities, hypothermic and low blood pressures.  In the emergency room, responded to IV fluid hydration.  Stool tested positive for C. difficile.  Started on oral vancomycin.  Remains extremely debilitated.  12/29 ,noted to be more weak on both sides.  A stat MRI head is negative for acute stroke. 12/30, with persistent quadriparesis, MRI of the cervical spine was done that shows severe cervical stenosis and spinal cord edema.  Started on steroids.  Discussed with neurosurgery and initiated transfer to [facility] for possible C-spine decompression.   Assessment & Plan:   Principal Problem:   Acute diarrhea Active Problems:   Generalized weakness   Hypothermia   Failure to thrive in adult   Hypotension   Symptomatic anemia   Hypoalbuminemia   Prolonged QT interval   Gastric AVM   CVA (cerebral vascular accident) ([place])   SIRS (systemic inflammatory response syndrome) ([facility])   Hypokalemia   Essential hypertension   Pressure injury of skin   Protein-calorie malnutrition, severe  C. difficile diarrhea: Clinically improving.  Diarrhea improved.  On oral vancomycin.  Day 4/10.  Post episode.  Physical debility/deconditioning with recent history of stroke and hemiparesis: Severe cervical spine disease/C-spine compression: Functional quadriplegia: Remains is debilitated and deconditioned due to multiple medical issues. Continue to work with PT OT.  Will ultimately need rehab.   Repeat MRI negative  for acute stroke.  Z66, folic acid levels normal. 12/30, MRI of the cervical spine shows severe cord compression, spinal cord edema.  Patient reported fall about 4 weeks ago.  Probably subacute cord compression. Case discussed with neurosurgery, Dr. Kathyrn Sheriff. started on IV steroids. Transfer to [place] for possible decompression of the cervical spine, tentatively planned for 12/12/2020. May have some respond with steroids, however has persistent weakness.  Electrolyte abnormalities, hypokalemia/hypomagnesemia: With poor oral intake.  Replaced aggressively.  Levels improved.    History of stroke: No new stroke.  On aspirin and statin.  PT OT and speech continue to see him.  Failure to thrive/severe protein calorie malnutrition: Augment nutrition.  Calorie count.  Fluctuating intake.  Hypertension: On metoprolol.  Stable.  Anemia of chronic disease: Hemoglobin 7.2.  Transfuse 2 units of PRBC.  Hemoccult negative.  Responded appropriately.  Signs/Symptoms: moderate fat depletion,severe fat depletion,moderate muscle depletion,severe muscle depletion,percent weight loss Percent weight loss: 7.4 % (9 lbs x 6 weeks) Interventions: MVI,Magic cup,Ensure Enlive (each supplement provides 350kcal and 20 grams of protein)  With MRI cervical spine results, case discussed with neurosurgery Dr. Kathyrn Sheriff.  Advised transfer to [facility] for formal evaluation.  Neurosurgery team thinks he will benefit with C-spine surgery if he is able to tolerate.  They will evaluate him once he arrives here.  DVT prophylaxis: SCDs Start: 11/26/2020 1913   Code Status: Full code Family Communication: Patient's son called and updated. Disposition Plan: Status is: Inpatient  Remains inpatient appropriate because:Inpatient level of care appropriate due to severity of illness   Dispo: The patient is from: Home  Anticipated d/c is to: SNF              Anticipated d/c date is: Unknown at this  time.              Patient currently is not medically stable to d/c.  Pending transfer to [facility] for neurosurgery evaluation.      Consultants:   Neurosurgery, will be seen once patient is at [facility].  Procedures:   None  Antimicrobials:   Vancomycin oral, 12/27---   Subjective: Patient seen and examined.  Overnight he is mostly sleepy and not very keen to conversation.  No other overnight events.  He says he is fine.  Objective: Vitals:   12/09/20 2050 12/10/20 0500 12/10/20 0602 12/10/20 0652  BP: 134/90  (!) 153/124 137/67  Pulse: 94  81   Resp: 16  18   Temp: 98.1 F (36.7 C)  (!) 97.5 F (36.4 C)   TempSrc: Oral  Oral   SpO2: 100%  100%   Weight:  57.8 kg 57.7 kg   Height:        Intake/Output Summary (Last 24 hours) at 12/10/2020 1321 Last data filed at 12/10/2020 1245 Gross per 24 hour  Intake --  Output 1000 ml  Net -1000 ml   Filed Weights   12/08/20 0500 12/10/20 0500 12/10/20 0602  Weight: 52.8 kg 57.8 kg 57.7 kg    Examination:  General exam: Chronically sick looking, frail and debilitated gentleman lying in bed. Respiratory system: Clear to auscultation. Respiratory effort normal.  No added sounds. Cardiovascular system: S1 & S2 heard, RRR. No JVD, murmurs, rubs, gallops or clicks. No pedal edema. Gastrointestinal system: Soft and nontender.. Central nervous system: Alert and oriented.  All extremities are weak and upper extremities power 4/5 bilateral.   He is somehow able to move his legs against gravity.  Bilateral 3/5. Psychiatry: Judgement and insight appear normal. Mood & affect flat.    Data Reviewed: I have personally reviewed following labs and imaging studies  CBC: Recent Labs  Lab 11/22/2020 1217 12/06/20 0405 12/08/20 0433 12/10/20 0609  WBC 7.2 9.0 7.7 5.1  NEUTROABS 5.6  --  6.5 4.4  HGB 7.2* 8.6* 9.5* 8.0*  HCT 23.7* 27.2* 30.8* 27.0*  MCV 93.7 90.1 91.7 94.4  PLT 271 242 247 224   Basic Metabolic  Panel: Recent Labs  Lab 11/19/2020 1217 11/09/2020 1554 12/06/20 0405 12/07/20 1401 12/10/20 0609  NA 135  --  136 136 142  K 2.7*  --  3.5 3.8 4.0  CL 100  --  103 105 113*  CO2 21*  --  21* 21* 22  GLUCOSE 99  --  70 102* 190*  BUN 15  --  9 10 22   CREATININE 0.66  --  0.48* 0.47* 0.48*  CALCIUM 8.5*  --  7.9* 8.2* 8.3*  MG  --  1.7 1.9 1.9 1.8  PHOS  --   --  4.3 4.4 3.4   GFR: Estimated Creatinine Clearance: 71.1 mL/min (A) (by C-G formula based on SCr of 0.48 mg/dL (L)). Liver Function Tests: Recent Labs  Lab 12/04/2020 1217 12/06/20 0405 12/07/20 1401  AST 24 21 48*  ALT 9 9 13   ALKPHOS 118 100 100  BILITOT 0.9 0.8 0.6  PROT 6.7 5.7* 6.0*  ALBUMIN 2.2* 1.8* 1.9*   No results for input(s): LIPASE, AMYLASE in the last 168 hours. No results for input(s): AMMONIA in the last 168 hours. Coagulation Profile: Recent Labs  Lab 11/28/2020 1217 12/06/20 0405  INR 1.2 1.2   Cardiac Enzymes: No results for input(s): CKTOTAL, CKMB, CKMBINDEX, TROPONINI in the last 168 hours. BNP (last 3 results) No results for input(s): PROBNP in the last 8760 hours. HbA1C: No results for input(s): HGBA1C in the last 72 hours. CBG: Recent Labs  Lab 12/09/20 1722 12/09/20 2359 12/10/20 0718 12/10/20 0830 12/10/20 1209  GLUCAP 184* 177* 160* 156* 147*   Lipid Profile: No results for input(s): CHOL, HDL, LDLCALC, TRIG, CHOLHDL, LDLDIRECT in the last 72 hours. Thyroid Function Tests: No results for input(s): TSH, T4TOTAL, FREET4, T3FREE, THYROIDAB in the last 72 hours. Anemia Panel: Recent Labs    12/07/20 1401  VITAMINB12 784  FOLATE 17.8   Sepsis Labs: Recent Labs  Lab 12/03/2020 1217 12/04/2020 1521  LATICACIDVEN 1.0 1.0    Recent Results (from the past 240 hour(s))  Blood Culture (routine x 2)     Status: None   Collection Time: 12/03/2020 12:17 PM   Specimen: BLOOD  Result Value Ref Range Status   Specimen Description BLOOD BLOOD RIGHT FOREARM  Final   Special Requests    Final    BOTTLES DRAWN AEROBIC AND ANAEROBIC Blood Culture adequate volume   Culture   Final    NO GROWTH 5 DAYS Performed at [facility], [address] [place], [place] [address]    Report Status 12/10/2020 FINAL  Final  Blood Culture (routine x 2)     Status: None   Collection Time: 11/20/2020 12:45 PM   Specimen: BLOOD  Result Value Ref Range Status   Specimen Description BLOOD LEFT ANTECUBITAL  Final   Special Requests   Final    BOTTLES DRAWN AEROBIC AND ANAEROBIC Blood Culture results may not be optimal due to an inadequate volume of blood received in culture bottles   Culture   Final    NO GROWTH 5 DAYS Performed at [facility], [address] [place], [place] [address]    Report Status 12/10/2020 FINAL  Final  Urine culture     Status: None   Collection Time: 12/07/2020 12:57 PM   Specimen: In/Out Cath Urine  Result Value Ref Range Status   Specimen Description   Final    IN/OUT CATH URINE Performed at [facility], [address], [place], [place] [address]    Special Requests   Final    NONE Performed at [facility], [address] [place], [place] [address]    Culture   Final    NO GROWTH Performed at [facility], [address] [place], [place] [address]    Report Status 12/07/2020 FINAL  Final  Resp Panel by RT-PCR (Flu A&B, Covid) Nasopharyngeal Swab     Status: None   Collection Time: 12/09/2020  2:12 PM   Specimen: Nasopharyngeal Swab; Nasopharyngeal(NP) swabs in vial transport medium  Result Value Ref Range Status   SARS Coronavirus 2 by RT PCR NEGATIVE NEGATIVE Final    Comment: (NOTE) SARS-CoV-2 target nucleic acids are NOT DETECTED.  The SARS-CoV-2 RNA is generally detectable in upper respiratory specimens during the acute phase of infection. The lowest concentration of SARS-CoV-2 viral copies this assay can detect is 138 copies/mL. A negative result does not preclude SARS-Cov-2 infection and should not be used as the sole basis for treatment  or other patient management decisions. A negative result may occur with  improper specimen collection/handling, submission of specimen other than nasopharyngeal swab, presence of viral mutation(s) within the areas targeted  by this assay, and inadequate number of viral copies(<138 copies/mL). A negative result must be combined with clinical observations, patient history, and epidemiological information. The expected result is Negative.  Fact Sheet for Patients:  BloggerCourse.com  Fact Sheet for Healthcare Providers:  SeriousBroker.it  This test is no t yet approved or cleared by the [place] FDA and  has been authorized for detection and/or diagnosis of SARS-CoV-2 by FDA under an Emergency Use Authorization (EUA). This EUA will remain  in effect (meaning this test can be used) for the duration of the COVID-19 declaration under Section 564(b)(1) of the Act, 21 U.S.C.section 360bbb-3(b)(1), unless the authorization is terminated  or revoked sooner.       Influenza A by PCR NEGATIVE NEGATIVE Final   Influenza B by PCR NEGATIVE NEGATIVE Final    Comment: (NOTE) The Xpert Xpress SARS-CoV-2/FLU/RSV plus assay is intended as an aid in the diagnosis of influenza from Nasopharyngeal swab specimens and should not be used as a sole basis for treatment. Nasal washings and aspirates are unacceptable for Xpert Xpress SARS-CoV-2/FLU/RSV testing.  Fact Sheet for Patients: BloggerCourse.com  Fact Sheet for Healthcare Providers: SeriousBroker.it  This test is not yet approved or cleared by the [place] FDA and has been authorized for detection and/or diagnosis of SARS-CoV-2 by FDA under an Emergency Use Authorization (EUA). This EUA will remain in effect (meaning this test can be used) for the duration of the COVID-19 declaration under Section 564(b)(1) of the Act, 21 U.S.C. section  360bbb-3(b)(1), unless the authorization is terminated or revoked.  Performed at [facility], [address], [place], [place] [address]   C Difficile Quick Screen w PCR reflex     Status: Abnormal   Collection Time: 12/06/20  4:00 PM   Specimen: STOOL  Result Value Ref Range Status   C Diff antigen POSITIVE (A) NEGATIVE Final   C Diff toxin POSITIVE (A) NEGATIVE Final    Comment: CRITICAL RESULT CALLED TO, READ BACK BY AND VERIFIED WITH: CARA LINGLER@1730  12/06/20 BY JONES,T    C Diff interpretation Toxin producing C. difficile detected.  Final    Comment: CRITICAL RESULT CALLED TO, READ BACK BY AND VERIFIED WITH: CARA LINGER @1730  12/06/20 BY JONES,T Performed at [facility], [address], [place], [place]          Radiology Studies: No results found.      Scheduled Meds: . aspirin EC  81 mg Oral Daily  . atorvastatin  40 mg Oral Daily  . Chlorhexidine Gluconate Cloth  6 each Topical Daily  . dexamethasone (DECADRON) injection  8 mg Intravenous Q6H  . feeding supplement  237 mL Oral BID BM  . folic acid  1 mg Oral Daily  . metoprolol tartrate  25 mg Oral BID  . multivitamin with minerals  1 tablet Oral Daily  . nutrition supplement (JUVEN)  1 packet Oral BID BM  . vancomycin  125 mg Oral QID   Continuous Infusions: . dextrose 5 % and 0.9% NaCl 125 mL/hr at 12/10/20 0645     LOS: 5 days    Time spent: 30 minutes    02/07/21, MD [facility] Pager [phone]

## 2020-12-10 NOTE — Progress Notes (Signed)
Obtained report from Susa Raring at [facility], patient will be transported to 3w-18

## 2020-12-10 NOTE — Progress Notes (Signed)
I was notified by Ishmael Holter regarding Mr. Eddleman's HR and 2-3 pauses. Upon arrival, Mr. Colclough is oriented x2, denies SOB and CP. 12 lead EKG is SB with RBBB HR 50-72. BP 150/91. Skin warm pink and dry. BBS CTA diminished in bases. Pt is on telemetry. Oncoming RN Alger Memos has contacted [facility] for update and further orders.

## 2020-12-10 DEATH — deceased

## 2020-12-11 ENCOUNTER — Encounter (HOSPITAL_COMMUNITY): Payer: Self-pay | Admitting: Internal Medicine

## 2020-12-11 DIAGNOSIS — R001 Bradycardia, unspecified: Secondary | ICD-10-CM | POA: Diagnosis not present

## 2020-12-11 DIAGNOSIS — I455 Other specified heart block: Secondary | ICD-10-CM

## 2020-12-11 DIAGNOSIS — R197 Diarrhea, unspecified: Secondary | ICD-10-CM | POA: Diagnosis not present

## 2020-12-11 LAB — CBC WITH DIFFERENTIAL/PLATELET
Abs Immature Granulocytes: 0.13 10*3/uL — ABNORMAL HIGH (ref 0.00–0.07)
Basophils Absolute: 0 10*3/uL (ref 0.0–0.1)
Basophils Relative: 0 %
Eosinophils Absolute: 0 10*3/uL (ref 0.0–0.5)
Eosinophils Relative: 0 %
HCT: 30.6 % — ABNORMAL LOW (ref 39.0–52.0)
Hemoglobin: 9.2 g/dL — ABNORMAL LOW (ref 13.0–17.0)
Immature Granulocytes: 3 %
Lymphocytes Relative: 9 %
Lymphs Abs: 0.4 10*3/uL — ABNORMAL LOW (ref 0.7–4.0)
MCH: 28.1 pg (ref 26.0–34.0)
MCHC: 30.1 g/dL (ref 30.0–36.0)
MCV: 93.6 fL (ref 80.0–100.0)
Monocytes Absolute: 0.1 10*3/uL (ref 0.1–1.0)
Monocytes Relative: 3 %
Neutro Abs: 3.6 10*3/uL (ref 1.7–7.7)
Neutrophils Relative %: 85 %
Platelets: 225 10*3/uL (ref 150–400)
RBC: 3.27 MIL/uL — ABNORMAL LOW (ref 4.22–5.81)
RDW: 21.5 % — ABNORMAL HIGH (ref 11.5–15.5)
WBC: 4.2 10*3/uL (ref 4.0–10.5)
nRBC: 1 % — ABNORMAL HIGH (ref 0.0–0.2)

## 2020-12-11 LAB — COMPREHENSIVE METABOLIC PANEL
ALT: 20 U/L (ref 0–44)
AST: 34 U/L (ref 15–41)
Albumin: 1.9 g/dL — ABNORMAL LOW (ref 3.5–5.0)
Alkaline Phosphatase: 95 U/L (ref 38–126)
Anion gap: 12 (ref 5–15)
BUN: 19 mg/dL (ref 8–23)
CO2: 19 mmol/L — ABNORMAL LOW (ref 22–32)
Calcium: 8.7 mg/dL — ABNORMAL LOW (ref 8.9–10.3)
Chloride: 112 mmol/L — ABNORMAL HIGH (ref 98–111)
Creatinine, Ser: 0.59 mg/dL — ABNORMAL LOW (ref 0.61–1.24)
GFR, Estimated: >60 mL/min (ref >60)
Glucose, Bld: 155 mg/dL — ABNORMAL HIGH (ref 70–99)
Potassium: 4.1 mmol/L (ref 3.5–5.1)
Sodium: 143 mmol/L (ref 135–145)
Total Bilirubin: 0.6 mg/dL (ref 0.3–1.2)
Total Protein: 6.2 g/dL — ABNORMAL LOW (ref 6.5–8.1)

## 2020-12-11 LAB — GLUCOSE, CAPILLARY
Glucose-Capillary: 141 mg/dL — ABNORMAL HIGH (ref 70–99)
Glucose-Capillary: 131 mg/dL — ABNORMAL HIGH (ref 70–99)
Glucose-Capillary: 204 mg/dL — ABNORMAL HIGH (ref 70–99)

## 2020-12-11 LAB — MRSA PCR SCREENING: MRSA by PCR: NEGATIVE

## 2020-12-11 MED ORDER — THIAMINE HCL 100 MG PO TABS
100.0000 mg | ORAL_TABLET | Freq: Every day | ORAL | Status: DC
  Administered 2020-12-11: 100 mg via ORAL
  Filled 2020-12-11: qty 1

## 2020-12-11 NOTE — Consult Note (Signed)
Cardiology Consultation:   Patient ID: Stanley Scott; [id number]; 17-Aug-1951   Admit date: December 29, 2020 Date of Consult: 12/11/2020  Primary Care Provider: Toma Deiters, MD Primary Cardiologist: No primary care provider on file. New Primary Electrophysiologist:  None   Patient Profile:   Stanley Scott is a 70 y.o. 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  History of Present Illness:   Stanley Scott was admitted to [facility] (from [facility]) 10/18-10/30/2021 for acute CVA, s/p TPA w/ subsequent GIB 2nd AVMs. Significant bilateral carotid dz noted.   EF was mildly reduced at 45-50% w/ inferoapical HK on echo. MV showed scar but no ischemia, EF 31%. Pt cleared for L CEA. D/c to SNF rehab, to come back for CEA.  Pt admitted 11/09-11/09/2020 for L CEA, tolerated procedure well, d/c back to rehab.   ER visit 12/17 for UTI  Admitted to [facility] 12/27 for generalized weakness and extremity numbness. Recent probs w/ diarrhea. He had recently fallen as well. K+ 2.7, H&H 7.2/23.7, T 93.1. Stool + C. Diff.   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 Started on steroids and tx [facility] on 01/01.   Palliative Care saw and he is currently full code.  Neurosurgeon saw and plan is for spinal cord decompression on 01/03.  The spinal cord injury was felt to have occurred when he fell prior to his 12/27 admission.  Ever since his admission to [facility], he has been bradycardic at times.  Pt had been on metoprolol 25 mg twice daily, this was stopped.  His last dose was on 12/31.  Cardiology was asked to evaluate him.  Stanley Scott is unaware of the low heart rate with pauses.  Although his speech is slow, it is  understandable.  He is alert and oriented x2, denies any history of presyncope or syncope.  He denies any chest pain.  He denies shortness of breath.  He has generalized weakness in addition to right-sided weakness since his CVA.  While talking to him in the room, his heart rate will increase into the 50s, but it is in the 40s at rest.   Past Medical History:  Diagnosis Date  . Cardiomyopathy (HCC) 09/2020   EF 45-50% w/ inferoapical HK on echo, MV w/ scar, no ischemia at [facility] in setting of CVA  . Carotid artery disease (HCC) 09/2020  . CVA (cerebral vascular accident) (HCC) 09/2020  . Hyperlipidemia LDL goal <70   . Seizure [facility])     Past Surgical History:  Procedure Laterality Date  . CAROTID ENDARTERECTOMY Left 10/18/2020   Dr Zella Ball, Renette Butters     Prior to Admission medications   Medication Sig Start Date End Date Taking? Authorizing Provider  acetaminophen (TYLENOL) 500 MG tablet Take 500 mg by mouth every 6 (six) hours as needed for moderate pain or headache.   Yes [provider]  atorvastatin (LIPITOR) 40 MG tablet Take 40 mg by mouth daily. 10/08/20  Yes [provider]  cephALEXin (KEFLEX) 500 MG capsule Take 1 capsule (500 mg total) by mouth 4 (four) times daily. 11/25/20  Yes Vanetta Mulders, MD  ergocalciferol (VITAMIN D2) 1.25 MG (50000 UT) capsule Take 50,000 Units by  mouth once a week. 10/08/20  Yes [provider]  folic acid (FOLVITE) 1 MG tablet Take 1 mg by mouth daily. 10/08/20  Yes [provider]  metoprolol tartrate (LOPRESSOR) 50 MG tablet Take 50 mg by mouth 2 (two) times daily. 11/30/20  Yes [provider]  pantoprazole (PROTONIX) 40 MG tablet Take 40 mg by mouth daily. 10/08/20  Yes [provider]  sodium chloride 1 g tablet Take 1 g by mouth 2 (two) times daily with a meal. 10/08/20  Yes [provider]  thiamine 100 MG tablet Take 100 mg by mouth daily. Patient not  taking: No sig reported 10/08/20   [provider]    Inpatient Medications: Scheduled Meds: . aspirin EC  81 mg Oral Daily  . atorvastatin  40 mg Oral Daily  . Chlorhexidine Gluconate Cloth  6 each Topical Daily  . dexamethasone (DECADRON) injection  8 mg Intravenous Q6H  . feeding supplement  237 mL Oral BID BM  . folic acid  1 mg Oral Daily  . multivitamin with minerals  1 tablet Oral Daily  . nutrition supplement (JUVEN)  1 packet Oral BID BM  . vancomycin  125 mg Oral QID   Continuous Infusions: . dextrose 5 % and 0.9% NaCl 125 mL/hr at 12/10/20 1752   PRN Meds: acetaminophen, ondansetron (ZOFRAN) IV  Allergies:   Not on File  Social History:   Social History   Socioeconomic History  . Marital status: Widowed    Spouse name: Not on file  . Number of children: Not on file  . Years of education: Not on file  . Highest education level: Not on file  Occupational History  . Not on file  Tobacco Use  . Smoking status: Never Smoker  . Smokeless tobacco: Never Used  Vaping Use  . Vaping Use: Never used  Substance and Sexual Activity  . Alcohol use: Not Currently  . Drug use: Never  . Sexual activity: Not on file  Other Topics Concern  . Not on file  Social History Narrative  . Not on file   Social Determinants of Health   Financial Resource Strain: Not on file  Food Insecurity: Not on file  Transportation Needs: Not on file  Physical Activity: Not on file  Stress: Not on file  Social Connections: Not on file  Intimate Partner Violence: Not on file    Family History:   History reviewed. No pertinent family history. Family Status:  No family status information on file.    ROS:  Please see the history of present illness.  All other ROS reviewed and negative.     Physical Exam/Data:   Vitals:   12/10/20 1958 12/11/20 0025 12/11/20 0510 12/11/20 0824  BP: (!) 150/91 (!) 167/97 (!) 161/87 (!) 133/91  Pulse: 79 77 76 (!) 52  Resp:  16 16 15    Temp: 98 F (36.7 C) 98.3 F (36.8 C) 98.5 F (36.9 C) 98.6 F (37 C)  TempSrc: Oral Oral Oral Oral  SpO2: 100% 100% 100% 100%  Weight:      Height:        Intake/Output Summary (Last 24 hours) at 12/11/2020 1023 Last data filed at 12/10/2020 1707 Gross per 24 hour  Intake --  Output 850 ml  Net -850 ml    Last 3 Weights 12/10/2020 12/10/2020 12/08/2020  Weight (lbs) 127 lb 3.3 oz 127 lb 6.8 oz 116 lb 6.5 oz  Weight (kg) 57.7 kg 57.8 kg 52.8  kg     Body mass index is 21.17 kg/m.   General: Under nourished, well developed, male in no acute distress HEENT: normal for age with very poor dentition Lymph: no adenopathy Neck: JVD -mildly elevated Endocrine:  No thryomegaly Vascular: No carotid bruits; 4/4 extremity pulses 2+  Cardiac:  normal S1, S2; RRR; ?soft murmur Lungs:  clear bilaterally, no wheezing, rhonchi or rales  Abd: soft, nontender, no hepatomegaly  Ext: no edema Musculoskeletal:  No deformities, BUE and BLE strength weak, worse on the right than on the left Skin: warm and dry  Neuro:  CNs 2-12 intact, no focal abnormalities noted Psych:  Normal affect   EKG:  The EKG was personally reviewed and demonstrates: 01/02 ECG is sinus bradycardia, heart rate 54 with sinus arrhythmia Telemetry:  Telemetry was personally reviewed and demonstrates: Sinus rhythm, mostly sinus bradycardia with multiple pauses, underlying bradycardia in the 40s with pauses up to 3.78 seconds causing the registered heart rate to read in the 20s.  Most pauses are less than 3 seconds long   CV studies:   [facility] Left Ventricle  Normal left ventricular size. Wall thickness is normal. EF: 45-50%. Infero-apical hypokinesis. Doppler parameters are indeterminate for diastolic function.   Right Ventricle  Right ventricle appears normal. Systolic function is normal.   Left Atrium  Left atrium is normal in size. Injection of agitated saline documents no interatrial shunt..    Right Atrium  Normal sized right atrium.   IVC/SVC  The inferior vena cava demonstrates a diameter of <=2.1 cm and collapses >50%; therefore, the right atrial pressure is estimated at 3 mmHg.   Mitral Valve  The leaflets are mildly thickened. There is trace regurgitation. There is no evidence of mitral valve stenosis.   Tricuspid Valve  The leaflets exhibit normal excursion. There is trace regurgitation. Unable to assess RVSP due to incomplete Doppler signal.   Aortic Valve  The leaflets are moderately thickened. There is no regurgitation or stenosis.   Pulmonic Valve  The pulmonic valve was not well visualized. No regurgitation present on the pulmonic valve   Ascending Aorta  The aortic root is normal in size.   Pericardium  There is no pericardial effusion.   Study Details  A complete echo was performed using complete 2D, color flow Doppler and spectral Doppler. Saline (bubble) contrast was injected during the study. Overall the study quality was adequate.   NM STRESS TEST: 10/03/2020 TECHNIQUE: Following the intravenous administration of 10.7 mCi technetium sestamibi at rest and 33 mCi technetium sestamibi with Lexiscan at stress, gated cardiac imaging was performed   COMPARISON: None   INDICATION: Pre-op noncardiac surgery, high risk   FINDINGS:  Fixed perfusion defect in lateral wall consistent with infarct. No reversible perfusion defects to suggest inducible ischemia.   Ejection fraction is 31%. End-diastolic volume is 87 ml. End-systolic volume is 59 ml.   Left ventricular wall motion:Global hypokinesis..  IMPRESSION:   No evidence of inducible ischemia.  Lateral wall infarct.  Hypokinesis and decreased ejection fraction    Laboratory Data:   Chemistry Recent Labs  Lab 12/10/20 0609 12/10/20 2022 12/11/20 0747  NA 142 142 143  K 4.0 4.1 4.1  CL 113* 114* 112*  CO2 22 20* 19*  GLUCOSE 190* 287* 155*  BUN 22 20 19   CREATININE 0.48* 0.59* 0.59*   CALCIUM 8.3* 8.3* 8.7*  GFRNONAA >60 >60 >60  ANIONGAP 7 8 12     Lab Results  Component  Value Date   ALT 20 12/11/2020   AST 34 12/11/2020   ALKPHOS 95 12/11/2020   BILITOT 0.6 12/11/2020   Hematology Recent Labs  Lab 12/08/20 0433 12/10/20 0609 12/11/20 0747  WBC 7.7 5.1 4.2  RBC 3.36* 2.86* 3.27*  HGB 9.5* 8.0* 9.2*  HCT 30.8* 27.0* 30.6*  MCV 91.7 94.4 93.6  MCH 28.3 28.0 28.1  MCHC 30.8 29.6* 30.1  RDW 21.1* 21.1* 21.5*  PLT 247 224 225   Cardiac Enzymes High Sensitivity Troponin:  No results for input(s): TROPONINIHS in the last 720 hours.    BNPNo results for input(s): BNP, PROBNP in the last 168 hours.  DDimer No results for input(s): DDIMER in the last 168 hours. TSH:  Lab Results  Component Value Date   TSH 4.426 12/09/2020   Lipids:No results found for: CHOL, HDL, LDLCALC, LDLDIRECT, TRIG, CHOLHDL HgbA1c:No results found for: HGBA1C Magnesium:  Magnesium  Date Value Ref Range Status  12/10/2020 1.8 1.7 - 2.4 mg/dL Final    Comment:    Performed at [facility], [place] [address], [place], [place] [id number]     Radiology/Studies:  MR BRAIN WO CONTRAST  Result Date: 12/07/2020 CLINICAL DATA:  Acute neuro deficit. EXAM: MRI HEAD WITHOUT CONTRAST TECHNIQUE: Multiplanar, multiecho pulse sequences of the brain and surrounding structures were obtained without intravenous contrast. COMPARISON:  CT head 12/01/2020.  CT angio head and neck 09/26/2020 FINDINGS: Brain: Negative for acute infarct. Mild white matter changes bilaterally. Small chronic infarct left frontal lobe over the convexity. Negative for hemorrhage or mass. Moderate atrophy without hydrocephalus. Vascular: Normal arterial flow voids Skull and upper cervical spine: No focal skeletal lesion. Cervical spondylosis with bony sclerosis as noted on the prior CTA neck. Sinuses/Orbits: Mild mucosal edema paranasal sinuses. Negative orbit. Other: None IMPRESSION: Moderate atrophy.  Small chronic infarct  left frontal lobe. Negative for acute infarct. Electronically Signed   By: Franchot Gallo M.D.   On: 12/07/2020 10:58   MR CERVICAL SPINE W WO CONTRAST  Result Date: 12/08/2020 CLINICAL DATA:  Bilateral upper extremity and lower extremity weakness. The patient suffered a fall approximately 4 weeks ago. EXAM: MRI CERVICAL SPINE WITHOUT AND WITH CONTRAST TECHNIQUE: Multiplanar and multiecho pulse sequences of the cervical spine, to include the craniocervical junction and cervicothoracic junction, were obtained without and with intravenous contrast. CONTRAST:  5 mL GADAVIST IV SOLN COMPARISON:  CT angiogram of the neck 09/27/2019. FINDINGS: Alignment: There is mild reversal of the normal cervical lordosis. Vertebrae: No fracture, evidence of discitis, or bone lesion. Degenerative endplate signal change is seen from C3-4 to C6-7, worst at C3-4. Cord: Edema without enhancement is seen in the cervical cord from approximately the inferior endplate of C3 is through the C4-5 level. Posterior Fossa, vertebral arteries, paraspinal tissues: Negative. Disc levels: C2-3: Disc bulge effaces the ventral thecal sac but the central canal and foramina appear open. C3-4: Disc bulge, uncovertebral disease and left much worse than right facet arthropathy. There is severe central canal and bilateral foraminal stenosis. The cord is markedly deformed. C4-5: Disc bulge, uncovertebral disease and facet arthropathy on the left. There is severe central canal and bilateral foraminal narrowing. The cord is severely flattened. C5-6: Disc bulge and right worse than left uncovertebral disease. There is also moderate bilateral facet arthropathy. The ventral thecal sac is nearly effaced. Moderately severe to severe foraminal narrowing is worse on the right. C6-7: Disc bulge and uncovertebral spurring. Bilateral facet arthropathy. The central canal is open. Severe bilateral  foraminal narrowing. C7-T1: Negative. IMPRESSION: Severe central canal and  bilateral foraminal stenosis at C3-4 and C4-5. 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 Moderately severe to severe bilateral foraminal narrowing at C5-6 is worse on the right. The ventral thecal sac is nearly effaced by disc at this level. Severe bilateral foraminal narrowing at C6-7. The central canal is open at this level. These results were called by telephone at the time of interpretation on 12/08/2020 at 12:31 pm to provider [facility] , who verbally acknowledged these results. Electronically Signed   By: Drusilla Kanner M.D.   On: 12/08/2020 12:35   DG Swallowing Func-Speech Pathology  Result Date: 12/08/2020 Objective Swallowing Evaluation: Type of Study: MBS-Modified Barium Swallow Study  Patient Details Name: Tarin Navarez MRN: [id number] Date of Birth: [birth year] Today's Date: 12/08/2020 Time: SLP Start Time (ACUTE ONLY): 1210 -SLP Stop Time (ACUTE ONLY): 1235 SLP Time Calculation (min) (ACUTE ONLY): 25 min Past Medical History: No past medical history on file. Past Surgical History: No past surgical history on file. HPI: Aaiden Depoy is a 70 y.o. 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  Patient was unable to provide history, history was obtained from ED PA and ED medical record.  Per report, patient was reported to have had several episodes of loose to watery stools daily for the past week.  He has also had poor appetite, though family encouraged oral hydration.  Patient sustained a fall by landing on his outstretched left upper extremity while being transferred from his bed to the bedside commode (around 2 AM today).  He complained of left  thumb, forearm and shoulder pain, but denies head injury.  Patient was seen in the ED on 12/17 and was suspected to have acute cystitis without hematuria, he was discharged home with Keflex at that time.   Patient was reported to be sedentary at baseline and lives with son who provide significant assistance with ADLs.  He denies chest pain, shortness of breath, nausea, vomiting, abdominal pain.  Subjective: 'Ok" Assessment / Plan / Recommendation CHL IP CLINICAL IMPRESSIONS 12/08/2020 Clinical Impression MBSS completed. Pt presents with moderate oropharyngeal dysphagia characterized by reduced labial closure, impaired mastication, with mildly prolonged oral phase, min delay in swallow initiation with swallow trigger at the level of the valleculae and pyriforms with liquids, reduced tongue base retraction, and reduced epiglottic deflection resulting in moderate vallecular residue, penetration of liquids during and after the swallow without witnessed aspiration, and eventual pooling of vallecular residuals into pyriforms. Pt's epiglottis appeared thick and bulbous at the base which became coated with initial tsp presentation of thins. It is possible that Pt had puree in valleculae from previously presented medications in puree, however it may be prudent to obtain CT neck (unless tissure abnormalities can be seen from MRI he just had). Pt with significant vallecular residuals with all textures and consistencies. Chin tuck and head turns were trialed and only the chin tuck was effective and only with puree and solids (no benefit with liquids). Pt cued to implement chin to chest when swallowing puree and mech soft and this facilitated clearance during the swallow in the pharynx. Pt unable to hold the cup himself due to BUE weakness,  so he was assessed mostly with straws for liquids. There was no appreciable difference in NTL and thins, therefore recommend thin liquids via straw. Recommend D2 and thin liquids via straw  sips when Pt is alert and upright, chin tuck with solids, cue to repeat/dry swallow, and clear throat with repeat swallow, po medications can be presented crushed or whole in puree. Pt is at risk for aspiration of residuals after the swallow (valleculae), however this is a risk no matter the textures or consistencies. No aspiration observed today. Consider neck CT to evaluate epiglottis thickening. SLP will follow. SLP Visit Diagnosis Dysphagia, oropharyngeal phase (R13.12) Attention and concentration deficit following -- Frontal lobe and executive function deficit following -- Impact on safety and function Mild aspiration risk   CHL IP TREATMENT RECOMMENDATION 12/08/2020 Treatment Recommendations Therapy as outlined in treatment plan below   Prognosis 12/08/2020 Prognosis for Safe Diet Advancement Fair Barriers to Reach Goals Severity of deficits Barriers/Prognosis Comment -- CHL IP DIET RECOMMENDATION 12/08/2020 SLP Diet Recommendations Dysphagia 2 (Fine chop) solids;Thin liquid Liquid Administration via Straw Medication Administration Whole meds with puree Compensations Slow rate;Small sips/bites;Multiple dry swallows after each bite/sip;Clear throat intermittently;Effortful swallow;Chin tuck Postural Changes Remain semi-upright after after feeds/meals (Comment);Seated upright at 90 degrees   CHL IP OTHER RECOMMENDATIONS 12/08/2020 Recommended Consults (No Data) Oral Care Recommendations Oral care BID;Staff/trained caregiver to provide oral care Other Recommendations Clarify dietary restrictions   CHL IP FOLLOW UP RECOMMENDATIONS 12/08/2020 Follow up Recommendations Skilled Nursing facility   [facility] IP FREQUENCY AND DURATION 12/08/2020 Speech Therapy Frequency (ACUTE ONLY) min 2x/week Treatment Duration 1 week      CHL IP ORAL PHASE 12/08/2020 Oral Phase Impaired Oral - Pudding Teaspoon -- Oral - Pudding Cup -- Oral - Honey Teaspoon -- Oral - Honey Cup -- Oral - Nectar Teaspoon -- Oral - Nectar Cup -- Oral - Nectar  Straw Weak lingual manipulation Oral - Thin Teaspoon Left anterior bolus loss;Right anterior bolus loss Oral - Thin Cup Left anterior bolus loss;Right anterior bolus loss Oral - Thin Straw WFL Oral - Puree (No Data) Oral - Mech Soft Impaired mastication Oral - Regular -- Oral - Multi-Consistency -- Oral - Pill Delayed oral transit;Reduced posterior propulsion Oral Phase - Comment --  CHL IP PHARYNGEAL PHASE 12/08/2020 Pharyngeal Phase Impaired Pharyngeal- Pudding Teaspoon -- Pharyngeal -- Pharyngeal- Pudding Cup -- Pharyngeal -- Pharyngeal- Honey Teaspoon -- Pharyngeal -- Pharyngeal- Honey Cup -- Pharyngeal -- Pharyngeal- Nectar Teaspoon -- Pharyngeal -- Pharyngeal- Nectar Cup -- Pharyngeal -- Pharyngeal- Nectar Straw Delayed swallow initiation-vallecula;Delayed swallow initiation-pyriform sinuses;Reduced epiglottic inversion;Reduced tongue base retraction;Penetration/Aspiration during swallow;Penetration/Apiration after swallow;Pharyngeal residue - valleculae;Pharyngeal residue - pyriform;Lateral channel residue Pharyngeal Material does not enter airway;Material enters airway, remains ABOVE vocal cords then ejected out;Material enters airway, remains ABOVE vocal cords and not ejected out Pharyngeal- Thin Teaspoon Delayed swallow initiation-vallecula;Reduced epiglottic inversion;Reduced tongue base retraction;Penetration/Aspiration during swallow;Penetration/Apiration after swallow;Pharyngeal residue - valleculae Pharyngeal Material does not enter airway;Material enters airway, remains ABOVE vocal cords then ejected out Pharyngeal- Thin Cup Delayed swallow initiation-pyriform sinuses;Reduced epiglottic inversion;Reduced tongue base retraction;Penetration/Aspiration during swallow;Penetration/Apiration after swallow;Pharyngeal residue - valleculae;Pharyngeal residue - pyriform;Lateral channel residue Pharyngeal Material enters airway, remains ABOVE vocal cords then ejected out;Material enters airway, remains ABOVE  vocal cords and not ejected out;Material does not enter airway Pharyngeal- Thin Straw Delayed swallow initiation-pyriform sinuses;Reduced epiglottic inversion;Penetration/Aspiration during swallow;Penetration/Apiration after swallow;Pharyngeal residue - valleculae;Pharyngeal residue - pyriform;Lateral channel residue Pharyngeal Material does not enter airway;Material enters airway, remains ABOVE vocal cords then ejected out;Material enters  airway, remains ABOVE vocal cords and not ejected out Pharyngeal- Puree Delayed swallow initiation-vallecula;Reduced epiglottic inversion;Reduced tongue base retraction;Pharyngeal residue - valleculae Pharyngeal -- Pharyngeal- Mechanical Soft -- Pharyngeal -- Pharyngeal- Regular -- Pharyngeal -- Pharyngeal- Multi-consistency -- Pharyngeal -- Pharyngeal- Pill WFL Pharyngeal -- Pharyngeal Comment --  CHL IP CERVICAL ESOPHAGEAL PHASE 12/08/2020 Cervical Esophageal Phase WFL Pudding Teaspoon -- Pudding Cup -- Honey Teaspoon -- Honey Cup -- Nectar Teaspoon -- Nectar Cup -- Nectar Straw -- Thin Teaspoon -- Thin Cup -- Thin Straw -- Puree -- Mechanical Soft -- Regular -- Multi-consistency -- Pill -- Cervical Esophageal Comment -- Thank you, Havery Moros, CCC-SLP [id number] PORTER,DABNEY 12/08/2020, 2:08 PM               Assessment and Plan:   1.  Sinus bradycardia -He is having pauses of almost 4 seconds on telemetry, underlying heart rate in the 40s. - However, pt is asymptomatic and maintaining a BP -Curb-sided Dr. Ladona Ridgel, no indication for pacemaker -Per Dr. Cristal Deer, could use Isuprel if bradycardia becomes symptomatic, but would not treat with normal or elevated blood pressure. -The most likely cause for this is vagal stimulation from the spinal cord edema -His heart rate does increase with movement, the pauses are all sinus pauses, no heart block seen on telemetry review -Last dose of beta-blocker was 12/31 PM -Recent echo and Myoview reviewed, no further work-up  needed prior to cervical surgery on 01/03. -However, he needs follow-up with cardiology as an outpatient, family needs to decide on follow-up with [place] or with [facility] cardiologist  Otherwise, per IM Principal Problem:   Acute diarrhea Active Problems:   Generalized weakness   Hypothermia   Failure to thrive in adult   Hypotension   Symptomatic anemia   Hypoalbuminemia   Prolonged QT interval   Gastric AVM   CVA (cerebral vascular accident) (HCC)   SIRS (systemic inflammatory response syndrome) (HCC)   Hypokalemia   Essential hypertension   Pressure injury of skin   Protein-calorie malnutrition, severe     For questions or updates, please contact [facility] Please consult [email or web address] for contact info under Cardiology/STEMI.   Melida Quitter, PA-C  12/11/2020 10:23 AM

## 2020-12-11 NOTE — Progress Notes (Signed)
PT Cancellation Note  Patient Details Name: Stanley Scott MRN: [id number] DOB: 06-Dec-1951   Cancelled Treatment:    Reason Eval/Treat Not Completed: Medical issues which prohibited therapy. Pt with severe cervical spinal cord stenosis, with planned decompression form Monday/Tuesday 1/3-1/4. PT will follow up after the surgery is complete and the pt is appropriate to mobilize.   Arlyss Gandy 12/11/2020, 7:51 AM

## 2020-12-11 NOTE — Progress Notes (Signed)
NEUROSURGERY PROGRESS NOTE  No acute events overnight. Still weakness in upper extremities from cord injury after fall. Planning for corpectomy tomorrow by Dr. Conchita Paris.   Temp:  [97.4 F (36.3 C)-98.6 F (37 C)] 98.6 F (37 C) (01/02 0824) Pulse Rate:  [52-80] 52 (01/02 0824) Resp:  [15-18] 15 (01/02 0824) BP: (133-167)/(71-97) 133/91 (01/02 0824) SpO2:  [99 %-100 %] 100 % (01/02 7169)  Plan: Corpectomy tomorrow. NPO after midnight   Sherryl Manges, NP 12/11/2020 8:58 AM

## 2020-12-11 NOTE — Progress Notes (Signed)
PROGRESS NOTE    Stanley Scott  ZOX:[id number] DOB: [birth year] DOA: 12/11/2020 PCP: Neale Burly, MD   Chief Complaint  Patient presents with  . 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  In the emergency room, responded to IV fluid hydration.  Stool tested positive for C. difficile.  Started on oral vancomycin.  Remains extremely debilitated.  12/29 ,noted to be more weak on both sides.  A stat MRI head is negative for acute stroke. 12/30, with persistent quadriparesis, MRI of the cervical spine was done that shows severe cervical stenosis and spinal cord edema.  Started on steroids.  Discussed with neurosurgery and initiated transfer to [facility] for possible C-spine decompression.  Assessment & Plan:   Principal Problem:   Acute diarrhea Active Problems:   Generalized weakness   Hypothermia   Failure to thrive in adult   Hypotension   Symptomatic anemia   Hypoalbuminemia   Prolonged QT interval   Gastric AVM   CVA (cerebral vascular accident) ([place])   SIRS (systemic inflammatory response syndrome) ([facility])   Hypokalemia   Essential hypertension   Pressure injury of skin   Protein-calorie malnutrition, severe  C. difficile diarrhea: Clinically improving.  Diarrhea improved.  On oral vancomycin 12/28-present.  Plan for 10 days.   Physical debility/deconditioning with recent history of stroke and hemiparesis: Severe cervical spine disease/C-spine compression: Functional quadriplegia: Remains is debilitated and deconditioned due to multiple medical issues. Continue to work  with PT OT.  Will ultimately need rehab.   Repeat MRI negative for acute stroke, chronic infarct L frontal lobe.  W11, folic acid levels normal. 12/30, MRI of the cervical spine shows edema within cervical cord from C3-C4-5 level consistent with myelomalacia or ischemia due to compression.  Severe central canal and bilateral foraminal stenosis at C3-4 and C4-5, moderately severe to severe bilateral foraminal narrowing at C5-6 (ventral thecal sac nearly effaced by disc at this level).  Severe bilateral foraminal narrowing at C6-7. Patient reported fall about 4 weeks ago.  Probably subacute cord compression? Case discussed with neurosurgery, Dr. Kathyrn Sheriff. started on IV steroids. Transfer to [place] for possible decompression of the cervical spine, tentatively planned for 12/12/2020. May have some respond with steroids, however has persistent weakness.  Sinus Bradycardia  Pauses: hold metoprolol.  Will discuss with cardiology.  EKG with sinus brady.  Electrolyte abnormalities, hypokalemia/hypomagnesemia: With poor oral intake.  Replaced aggressively.  Levels improved.    History of stroke: No new stroke.  On aspirin and statin.  PT OT and speech continue to see him.  Failure to thrive/severe protein calorie malnutrition: Augment nutrition.  Calorie count.  Fluctuating intake.  Hypertension: On metoprolol.  Stable.  Hold with bradycardia above.  Anemia of chronic disease: Hemoglobin 7.2.  Transfuse 2 units of PRBC.  Hemoccult negative.  Responded appropriately. Follow anemia labs   Signs/Symptoms: moderate fat depletion,severe fat depletion,moderate muscle depletion,severe muscle depletion,percent weight loss Percent weight loss: 7.4 % (9 lbs x 6 weeks) Interventions: MVI,Magic cup,Ensure Enlive (each supplement provides 350kcal and 20 grams of protein)  DVT prophylaxis:SCD Code Status: full  Family Communication: none at bedside Disposition:   Status  is:  Inpatient  Remains inpatient appropriate because:Inpatient level of care appropriate due to severity of illness   Dispo: The patient is from: Home              Anticipated d/c is to: pending              Anticipated d/c date is: > 3 days              Patient currently is not medically stable to d/c.       Consultants:   neurosurgery  Procedures:   none  Antimicrobials: Anti-infectives (From admission, onward)   Start     Dose/Rate Route Frequency Ordered Stop   12/06/20 1830  vancomycin (VANCOCIN) 50 mg/mL oral solution 125 mg        125 mg Oral 4 times daily 12/06/20 1738 12/16/20 1759   11/14/2020 1300  cefTRIAXone (ROCEPHIN) 1 g in sodium chloride 0.9 % 100 mL IVPB  Status:  Discontinued        1 g 200 mL/hr over 30 Minutes Intravenous Every 24 hours 11/16/2020 1259 11/26/2020 2218         Subjective: Difficult to understands Asking price Notes diarrhea is better A&Ox2 (unable to understand what he said for location)  Objective: Vitals:   12/10/20 1958 12/11/20 0025 12/11/20 0510 12/11/20 0824  BP: (!) 150/91 (!) 167/97 (!) 161/87 (!) 133/91  Pulse: 79 77 76 (!) 52  Resp:  16 16 15   Temp: 98 F (36.7 C) 98.3 F (36.8 C) 98.5 F (36.9 C) 98.6 F (37 C)  TempSrc: Oral Oral Oral Oral  SpO2: 100% 100% 100% 100%  Weight:      Height:        Intake/Output Summary (Last 24 hours) at 12/11/2020 0906 Last data filed at 12/10/2020 1707 Gross per 24 hour  Intake -  Output 850 ml  Net -850 ml   Filed Weights   12/08/20 0500 12/10/20 0500 12/10/20 0602  Weight: 52.8 kg 57.8 kg 57.7 kg    Examination:  General exam: Appears calm and comfortable  Respiratory system: Clear to auscultation. Respiratory effort normal. Cardiovascular system: RRR Gastrointestinal system: Abdomen is nondistended, soft and nontender.  Central nervous system: Alert and oriented x2. Moving all extremities, doesn't consistently follow commands - antigravity throughout all extremities,  approximately 4/5 on my exam today - difficult to compare Extremities: no LEE Skin: No rashes, lesions or ulcers Psychiatry: Judgement and insight appear normal. Mood & affect appropriate.     Data Reviewed: I have personally reviewed following labs and imaging studies  CBC: Recent Labs  Lab 11/16/2020 1217 12/06/20 0405 12/08/20 0433 12/10/20 0609 12/11/20 0747  WBC 7.2 9.0 7.7 5.1 4.2  NEUTROABS 5.6  --  6.5 4.4 PENDING  HGB 7.2* 8.6* 9.5* 8.0* 9.2*  HCT 23.7* 27.2* 30.8* 27.0* 30.6*  MCV 93.7 90.1 91.7 94.4 93.6  PLT 271 242 247 224 225    Basic Metabolic Panel: Recent Labs  Lab 12/07/2020 1554 12/06/20 0405 12/07/20 1401 12/10/20 0609 12/10/20 2022 12/11/20 0747  NA  --  136 136 142 142 143  K  --  3.5 3.8 4.0 4.1 4.1  CL  --  103 105 113* 114* 112*  CO2  --  21* 21* 22 20* 19*  GLUCOSE  --  70 102* 190* 287* 155*  BUN  --  9 10 22 20 19   CREATININE  --  0.48* 0.47* 0.48* 0.59* 0.59*  CALCIUM  --  7.9* 8.2* 8.3* 8.3* 8.7*  MG 1.7 1.9 1.9 1.8 1.8  --   PHOS  --  4.3 4.4 3.4  --   --     GFR: Estimated Creatinine Clearance: 71.1 mL/min (A) (by C-G formula based on SCr of 0.59 mg/dL (L)).  Liver Function Tests: Recent Labs  Lab 11/15/2020 1217 12/06/20 0405 12/07/20 1401 12/11/20 0747  AST 24 21 48* 34  ALT 9 9 13 20   ALKPHOS 118 100 100 95  BILITOT 0.9 0.8 0.6 0.6  PROT 6.7 5.7* 6.0* 6.2*  ALBUMIN 2.2* 1.8* 1.9* 1.9*    CBG: Recent Labs  Lab 12/10/20 1209 12/10/20 1627 12/10/20 2338 12/11/20 0601 12/11/20 0827  GLUCAP 147* 193* 214* 141* 131*     Recent Results (from the past 240 hour(s))  Blood Culture (routine x 2)     Status: None   Collection Time: 11/28/2020 12:17 PM   Specimen: BLOOD  Result Value Ref Range Status   Specimen Description BLOOD BLOOD RIGHT FOREARM  Final   Special Requests   Final    BOTTLES DRAWN AEROBIC AND ANAEROBIC Blood Culture adequate volume   Culture   Final    NO GROWTH 5 DAYS Performed at [facility], [address] [place], [place]    Report Status 12/10/2020 FINAL  Final  Blood Culture (routine x 2)     Status: None   Collection Time: 11/20/2020 12:45 PM   Specimen: BLOOD  Result Value Ref Range Status   Specimen Description BLOOD LEFT ANTECUBITAL  Final   Special Requests   Final    BOTTLES DRAWN AEROBIC AND ANAEROBIC Blood Culture results may not be optimal due to an inadequate volume of blood received in culture bottles   Culture   Final    NO GROWTH 5 DAYS Performed at [facility], [address] [place], [place]    Report Status 12/10/2020 FINAL  Final  Urine culture     Status: None   Collection Time: 12/03/2020 12:57 PM   Specimen: In/Out Cath Urine  Result Value Ref Range Status   Specimen Description   Final    IN/OUT CATH URINE Performed at [facility], [address] [place], [place]    Special Requests   Final    NONE Performed at [facility], [address], [place], [place]    Culture   Final    NO GROWTH Performed at [facility], [address] [place], [place]    Report Status 12/07/2020 FINAL  Final  Resp Panel by RT-PCR (Flu A&B, Covid) Nasopharyngeal Swab     Status: None   Collection Time: 11/27/2020  2:12 PM   Specimen: Nasopharyngeal Swab; Nasopharyngeal(NP) swabs in vial transport medium  Result Value Ref Range Status   SARS Coronavirus 2 by RT PCR NEGATIVE NEGATIVE Final    Comment: (NOTE) SARS-CoV-2 target nucleic acids are NOT DETECTED.  The SARS-CoV-2 RNA is generally detectable in upper respiratory specimens during the acute phase of infection. The lowest concentration of SARS-CoV-2 viral copies this assay can detect is 138 copies/mL. A negative result does not preclude SARS-Cov-2 infection and should not be used as the sole basis for treatment or other patient management decisions. A negative result may occur with  improper specimen collection/handling, submission of specimen  other than nasopharyngeal swab, presence of viral mutation(s) within the areas targeted by this assay, and inadequate number of viral copies(<138 copies/mL). A negative result  must be combined with clinical observations, patient history, and epidemiological information. The expected result is Negative.  Fact Sheet for Patients:  BloggerCourse.com  Fact Sheet for Healthcare Providers:  SeriousBroker.it  This test is no t yet approved or cleared by the [place] FDA and  has been authorized for detection and/or diagnosis of SARS-CoV-2 by FDA under an Emergency Use Authorization (EUA). This EUA will remain  in effect (meaning this test can be used) for the duration of the COVID-19 declaration under Section 564(b)(1) of the Act, 21 U.S.C.section 360bbb-3(b)(1), unless the authorization is terminated  or revoked sooner.       Influenza A by PCR NEGATIVE NEGATIVE Final   Influenza B by PCR NEGATIVE NEGATIVE Final    Comment: (NOTE) The Xpert Xpress SARS-CoV-2/FLU/RSV plus assay is intended as an aid in the diagnosis of influenza from Nasopharyngeal swab specimens and should not be used as a sole basis for treatment. Nasal washings and aspirates are unacceptable for Xpert Xpress SARS-CoV-2/FLU/RSV testing.  Fact Sheet for Patients: BloggerCourse.com  Fact Sheet for Healthcare Providers: SeriousBroker.it  This test is not yet approved or cleared by the [place] FDA and has been authorized for detection and/or diagnosis of SARS-CoV-2 by FDA under an Emergency Use Authorization (EUA). This EUA will remain in effect (meaning this test can be used) for the duration of the COVID-19 declaration under Section 564(b)(1) of the Act, 21 U.S.C. section 360bbb-3(b)(1), unless the authorization is terminated or revoked.  Performed at [facility], [address], [place], [place]  [address]   C Difficile Quick Screen w PCR reflex     Status: Abnormal   Collection Time: 12/06/20  4:00 PM   Specimen: STOOL  Result Value Ref Range Status   C Diff antigen POSITIVE (A) NEGATIVE Final   C Diff toxin POSITIVE (A) NEGATIVE Final    Comment: CRITICAL RESULT CALLED TO, READ BACK BY AND VERIFIED WITH: CARA LINGLER@1730  12/06/20 BY JONES,T    C Diff interpretation Toxin producing C. difficile detected.  Final    Comment: CRITICAL RESULT CALLED TO, READ BACK BY AND VERIFIED WITH: CARA LINGER @1730  12/06/20 BY JONES,T Performed at [facility], [address] [place], [place]          Radiology Studies: No results found.      Scheduled Meds: . aspirin EC  81 mg Oral Daily  . atorvastatin  40 mg Oral Daily  . Chlorhexidine Gluconate Cloth  6 each Topical Daily  . dexamethasone (DECADRON) injection  8 mg Intravenous Q6H  . feeding supplement  237 mL Oral BID BM  . folic acid  1 mg Oral Daily  . multivitamin with minerals  1 tablet Oral Daily  . nutrition supplement (JUVEN)  1 packet Oral BID BM  . vancomycin  125 mg Oral QID   Continuous Infusions: . dextrose 5 % and 0.9% NaCl 125 mL/hr at 12/10/20 1752     LOS: 6 days    Time spent: over 30 min    02/07/21, MD [facility]   To contact the attending provider between 7A-7P or the covering provider during after hours 7P-7A, please log into the web site [email or web address] and access using universal [place] password for that web site. If you do not have the password, please call the hospital operator.  12/11/2020, 9:06 AM

## 2020-12-12 DIAGNOSIS — R197 Diarrhea, unspecified: Secondary | ICD-10-CM | POA: Diagnosis not present

## 2020-12-12 LAB — CBC WITH DIFFERENTIAL/PLATELET
Abs Immature Granulocytes: 0.17 10*3/uL — ABNORMAL HIGH (ref 0.00–0.07)
Basophils Absolute: 0 10*3/uL (ref 0.0–0.1)
Basophils Relative: 0 %
Eosinophils Absolute: 0 10*3/uL (ref 0.0–0.5)
Eosinophils Relative: 0 %
HCT: 30 % — ABNORMAL LOW (ref 39.0–52.0)
Hemoglobin: 9.3 g/dL — ABNORMAL LOW (ref 13.0–17.0)
Immature Granulocytes: 5 %
Lymphocytes Relative: 9 %
Lymphs Abs: 0.3 10*3/uL — ABNORMAL LOW (ref 0.7–4.0)
MCH: 28.2 pg (ref 26.0–34.0)
MCHC: 31 g/dL (ref 30.0–36.0)
MCV: 90.9 fL (ref 80.0–100.0)
Monocytes Absolute: 0.2 10*3/uL (ref 0.1–1.0)
Monocytes Relative: 5 %
Neutro Abs: 2.9 10*3/uL (ref 1.7–7.7)
Neutrophils Relative %: 81 %
Platelets: 234 10*3/uL (ref 150–400)
RBC: 3.3 MIL/uL — ABNORMAL LOW (ref 4.22–5.81)
RDW: 21.2 % — ABNORMAL HIGH (ref 11.5–15.5)
WBC: 3.6 10*3/uL — ABNORMAL LOW (ref 4.0–10.5)
nRBC: 0 % (ref 0.0–0.2)

## 2020-12-12 LAB — COMPREHENSIVE METABOLIC PANEL
ALT: 31 U/L (ref 0–44)
AST: 46 U/L — ABNORMAL HIGH (ref 15–41)
Albumin: 1.9 g/dL — ABNORMAL LOW (ref 3.5–5.0)
Alkaline Phosphatase: 105 U/L (ref 38–126)
Anion gap: 6 (ref 5–15)
BUN: 21 mg/dL (ref 8–23)
CO2: 25 mmol/L (ref 22–32)
Calcium: 8.7 mg/dL — ABNORMAL LOW (ref 8.9–10.3)
Chloride: 111 mmol/L (ref 98–111)
Creatinine, Ser: 0.55 mg/dL — ABNORMAL LOW (ref 0.61–1.24)
GFR, Estimated: >60 mL/min (ref >60)
Glucose, Bld: 230 mg/dL — ABNORMAL HIGH (ref 70–99)
Potassium: 3.8 mmol/L (ref 3.5–5.1)
Sodium: 142 mmol/L (ref 135–145)
Total Bilirubin: 0.7 mg/dL (ref 0.3–1.2)
Total Protein: 5.7 g/dL — ABNORMAL LOW (ref 6.5–8.1)

## 2020-12-12 LAB — GLUCOSE, CAPILLARY
Glucose-Capillary: 182 mg/dL — ABNORMAL HIGH (ref 70–99)
Glucose-Capillary: 169 mg/dL — ABNORMAL HIGH (ref 70–99)
Glucose-Capillary: 153 mg/dL — ABNORMAL HIGH (ref 70–99)
Glucose-Capillary: 219 mg/dL — ABNORMAL HIGH (ref 70–99)

## 2020-12-12 LAB — HEMOGLOBIN A1C
Hgb A1c MFr Bld: 5.7 % — ABNORMAL HIGH (ref 4.8–5.6)
Mean Plasma Glucose: 116.89 mg/dL

## 2020-12-12 LAB — MAGNESIUM: Magnesium: 1.8 mg/dL (ref 1.7–2.4)

## 2020-12-12 LAB — PHOSPHORUS: Phosphorus: 2.4 mg/dL — ABNORMAL LOW (ref 2.5–4.6)

## 2020-12-12 LAB — VITAMIN B12: Vitamin B-12: 820 pg/mL (ref 180–914)

## 2020-12-12 LAB — AMMONIA: Ammonia: 17 umol/L (ref 9–35)

## 2020-12-12 LAB — TSH: TSH: 3.192 u[IU]/mL (ref 0.350–4.500)

## 2020-12-12 MED ORDER — INSULIN ASPART 100 UNIT/ML ~~LOC~~ SOLN
0.0000 [IU] | Freq: Three times a day (TID) | SUBCUTANEOUS | Status: DC
  Administered 2020-12-12 – 2020-12-13 (×4): 2 [IU] via SUBCUTANEOUS
  Administered 2020-12-13: 17:00:00 3 [IU] via SUBCUTANEOUS
  Administered 2020-12-14 – 2020-12-15 (×4): 2 [IU] via SUBCUTANEOUS
  Administered 2020-12-15 – 2020-12-16 (×3): 1 [IU] via SUBCUTANEOUS
  Administered 2020-12-16: 3 [IU] via SUBCUTANEOUS
  Administered 2020-12-17: 13:00:00 2 [IU] via SUBCUTANEOUS
  Administered 2020-12-17: 3 [IU] via SUBCUTANEOUS
  Administered 2020-12-18: 2 [IU] via SUBCUTANEOUS
  Administered 2020-12-18 – 2020-12-20 (×2): 3 [IU] via SUBCUTANEOUS
  Administered 2020-12-20 – 2020-12-21 (×3): 1 [IU] via SUBCUTANEOUS
  Administered 2020-12-21: 18:00:00 3 [IU] via SUBCUTANEOUS
  Administered 2020-12-22: 1 [IU] via SUBCUTANEOUS
  Administered 2020-12-22: 09:00:00 2 [IU] via SUBCUTANEOUS
  Administered 2020-12-22 – 2020-12-25 (×3): 1 [IU] via SUBCUTANEOUS

## 2020-12-12 MED ORDER — THIAMINE HCL 100 MG/ML IJ SOLN
250.0000 mg | Freq: Every day | INTRAVENOUS | Status: DC
  Administered 2020-12-16 – 2020-12-18 (×3): 250 mg via INTRAVENOUS
  Filled 2020-12-12 (×5): qty 2.5

## 2020-12-12 MED ORDER — THIAMINE HCL 100 MG PO TABS
100.0000 mg | ORAL_TABLET | Freq: Every day | ORAL | Status: DC
Start: 2020-12-21 — End: 2020-12-22
  Administered 2020-12-20 – 2020-12-21 (×2): 100 mg via ORAL
  Filled 2020-12-12 (×3): qty 1

## 2020-12-12 MED ORDER — THIAMINE HCL 100 MG/ML IJ SOLN
500.0000 mg | Freq: Three times a day (TID) | INTRAVENOUS | Status: AC
  Administered 2020-12-12 – 2020-12-15 (×9): 500 mg via INTRAVENOUS
  Filled 2020-12-12 (×9): qty 5

## 2020-12-12 NOTE — Progress Notes (Addendum)
PROGRESS NOTE    Stanley Scott  ESP:[id number] DOB: [birth year] DOA: 12/03/2020 PCP: Stanley Deiters, MD   Chief Complaint  Patient presents with  . 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  In the emergency room, responded to IV fluid hydration.  Stool tested positive for C. difficile.  Started on oral vancomycin.  Remains extremely debilitated.  12/29 ,noted to be more weak on both sides.  Stanley Scott stat MRI head is negative for acute stroke. 12/30, with persistent quadriparesis, MRI of the cervical spine was done that shows severe cervical stenosis and spinal cord edema.  Started on steroids.  Discussed with neurosurgery and initiated transfer to [facility] for possible C-spine decompression.  Assessment & Plan:   Principal Problem:   Acute diarrhea Active Problems:   Generalized weakness   Hypothermia   Failure to thrive in adult   Hypotension   Symptomatic anemia   Hypoalbuminemia   Prolonged QT interval   Gastric AVM   CVA (cerebral vascular accident) (HCC)   SIRS (systemic inflammatory response syndrome) (HCC)   Hypokalemia   Essential hypertension   Pressure injury of skin   Protein-calorie malnutrition, severe  C. difficile diarrhea: Clinically improving.  Diarrhea improved.  On oral vancomycin 12/28-present.  Plan for 10 days.   Physical debility/deconditioning with recent history of stroke and hemiparesis: Severe cervical spine disease/C-spine compression: Functional quadriplegia: Acute Metabolic Encephalopathy: Remains is debilitated and deconditioned due to multiple  medical issues. Continue to work with PT OT.  Will ultimately need rehab.   Repeat MRI negative for acute stroke, chronic infarct L frontal lobe.  B12, folic acid levels normal. 12/30, MRI of the cervical spine shows edema within cervical cord from C3-C4-5 level consistent with myelomalacia or ischemia due to compression.  Severe central canal and bilateral foraminal stenosis at C3-4 and C4-5, moderately severe to severe bilateral foraminal narrowing at C5-6 (ventral thecal sac nearly effaced by disc at this level).  Severe bilateral foraminal narrowing at C6-7. Patient reported fall about 4 weeks ago.  Probably subacute cord compression? Case discussed with neurosurgery, Stanley Scott. started on IV steroids. Transfer to [facility] for possible decompression of the cervical spine, tentatively planned for 12/12/2020. May have some respond with steroids, however has persistent weakness. Stanley Scott&Ox1-2 today (thought December), unclear what his baseline is, sounds like this is Stanley Scott decline, but difficult to tell with discussion with son -> b1, b12, tsh, ammonia, rpr pending, high dose thiamine  Sinus Bradycardia  Pauses: hold metoprolol.  Per cardiology, concern that this maybe neurally mediated given spinal cord edema.  No indication for pacemaker at this time - for anesthesia purposes, recommended isuprel or dopamine available with transcutaneous pacer pads nearby in case nerve stimulation with surgery drops HR.    Electrolyte abnormalities, hypokalemia/hypomagnesemia: With poor oral intake.  Replaced aggressively.  Levels improved.    History of stroke: No new stroke.  On aspirin and statin.  PT OT and speech continue to see him.  Hyperglycemia: SSI, A1c  Failure to thrive/severe protein calorie malnutrition: Augment nutrition.  Calorie count.  Fluctuating intake.  Hypertension: On metoprolol.  Stable.  Hold with bradycardia above.  Anemia of chronic disease: Hemoglobin 7.2.  Transfuse 2  units of PRBC.  Hemoccult negative.  Responded appropriately. Follow anemia labs   Signs/Symptoms: moderate fat depletion,severe fat depletion,moderate muscle depletion,severe muscle depletion,percent weight loss Percent weight loss: 7.4 % (9 lbs x 6 weeks) Interventions: MVI,Magic cup,Ensure Enlive (each supplement provides 350kcal and 20 grams of protein)  DVT prophylaxis:SCD Code Status: full  Family Communication: none at bedside Disposition:   Status is: Inpatient  Remains inpatient appropriate because:Inpatient level of care appropriate due to severity of illness   Dispo: The patient is from: Home              Anticipated d/c is to: pending              Anticipated d/c date is: > 3 days              Patient currently is not medically stable to d/c.       Consultants:   neurosurgery  Procedures:   none  Antimicrobials: Anti-infectives (From admission, onward)   Start     Dose/Rate Route Frequency Ordered Stop   12/06/20 1830  vancomycin (VANCOCIN) 50 mg/mL oral solution 125 mg        125 mg Oral 4 times daily 12/06/20 1738 12/16/20 1759   11/22/2020 1300  cefTRIAXone (ROCEPHIN) 1 g in sodium chloride 0.9 % 100 mL IVPB  Status:  Discontinued        1 g 200 mL/hr over 30 Minutes Intravenous Every 24 hours 11/29/2020 1259 11/20/2020 2218         Subjective: Again difficult to understand C/o pain in arms  Objective: Vitals:   12/11/20 1541 12/11/20 2011 12/12/20 0001 12/12/20 0506  BP: (!) 146/96 (!) 173/95 (!) 169/90 (!) 168/93  Pulse: (!) 54 61 (!) 58 (!) 58  Resp: 15 12 13 18   Temp: 98.3 F (36.8 C) 98.6 F (37 C) 98.2 F (36.8 C) 98.4 F (36.9 C)  TempSrc: Oral Oral Oral Oral  SpO2: 100% 100%  100%  Weight:      Height:        Intake/Output Summary (Last 24 hours) at 12/12/2020 02/09/2021 Last data filed at 12/11/2020 1838 Gross per 24 hour  Intake 300 ml  Output 1300 ml  Net -1000 ml   Filed Weights   12/08/20 0500 12/10/20 0500 12/10/20 0602   Weight: 52.8 kg 57.8 kg 57.7 kg    Examination:  General: No acute distress. Chronically ill appearing. Cardiovascular: Heart sounds show [name] regular rate, and rhythm Lungs: Clear to auscultation bilaterally Abdomen: Soft, nontender, nondistended  Neurological: Alert, but confused.  Moving all extremities, but not participating with strength exam.  Skin: Warm and dry. No rashes or lesions. Extremities: No clubbing or cyanosis. No edema.   Data Reviewed: I have personally reviewed following labs and imaging studies  CBC: Recent Labs  Lab 11/28/2020 1217 12/06/20 0405 12/08/20 0433 12/10/20 0609 12/11/20 0747 12/12/20 0143  WBC 7.2 9.0 7.7 5.1 4.2 3.6*  NEUTROABS 5.6  --  6.5 4.4 3.6 2.9  HGB 7.2* 8.6* 9.5* 8.0* 9.2* 9.3*  HCT 23.7* 27.2* 30.8* 27.0* 30.6* 30.0*  MCV 93.7 90.1 91.7 94.4 93.6 90.9  PLT 271 242 247 224 225 234    Basic Metabolic Panel: Recent Labs  Lab 12/06/20 0405 12/07/20 1401 12/10/20 0609 12/10/20 2022 12/11/20 0747 12/12/20 0143  NA 136 136 142 142 143 142  K 3.5 3.8 4.0 4.1 4.1 3.8  CL 103 105 113*  114* 112* 111  CO2 21* 21* 22 20* 19* 25  GLUCOSE 70 102* 190* 287* 155* 230*  BUN 9 10 22 20 19 21   CREATININE 0.48* 0.47* 0.48* 0.59* 0.59* 0.55*  CALCIUM 7.9* 8.2* 8.3* 8.3* 8.7* 8.7*  MG 1.9 1.9 1.8 1.8  --  1.8  PHOS 4.3 4.4 3.4  --   --  2.4*    GFR: Estimated Creatinine Clearance: 71.1 mL/min (Hayleigh Bawa) (by C-G formula based on SCr of 0.55 mg/dL (L)).  Liver Function Tests: Recent Labs  Lab 11/17/2020 1217 12/06/20 0405 12/07/20 1401 12/11/20 0747 12/12/20 0143  AST 24 21 48* 34 46*  ALT 9 9 13 20 31   ALKPHOS 118 100 100 95 105  BILITOT 0.9 0.8 0.6 0.6 0.7  PROT 6.7 5.7* 6.0* 6.2* 5.7*  ALBUMIN 2.2* 1.8* 1.9* 1.9* 1.9*    CBG: Recent Labs  Lab 12/10/20 2338 12/11/20 0601 12/11/20 0827 12/11/20 1544 12/12/20 0554  GLUCAP 214* 141* 131* 204* 182*     Recent Results (from the past 240 hour(s))  Blood Culture (routine x 2)      Status: None   Collection Time: 11/12/2020 12:17 PM   Specimen: BLOOD  Result Value Ref Range Status   Specimen Description BLOOD BLOOD RIGHT FOREARM  Final   Special Requests   Final    BOTTLES DRAWN AEROBIC AND ANAEROBIC Blood Culture adequate volume   Culture   Final    NO GROWTH 5 DAYS Performed at [facility], [address] [place], [address] [place]    Report Status 12/10/2020 FINAL  Final  Blood Culture (routine x 2)     Status: None   Collection Time: 11/20/2020 12:45 PM   Specimen: BLOOD  Result Value Ref Range Status   Specimen Description BLOOD LEFT ANTECUBITAL  Final   Special Requests   Final    BOTTLES DRAWN AEROBIC AND ANAEROBIC Blood Culture results may not be optimal due to an inadequate volume of blood received in culture bottles   Culture   Final    NO GROWTH 5 DAYS Performed at [facility], [address], [place], [address] [place]    Report Status 12/10/2020 FINAL  Final  Urine culture     Status: None   Collection Time: 12/06/2020 12:57 PM   Specimen: In/Out Cath Urine  Result Value Ref Range Status   Specimen Description   Final    IN/OUT CATH URINE Performed at [facility], [address] [place], [address] [place]    Special Requests   Final    NONE Performed at [facility], [address], [place], [address] [place]    Culture   Final    NO GROWTH Performed at [facility], [address], [place], [address] [place]    Report Status 12/07/2020 FINAL  Final  Resp Panel by RT-PCR (Flu Silverio Hagan&B, Covid) Nasopharyngeal Swab     Status: None   Collection Time: 12/09/2020  2:12 PM   Specimen: Nasopharyngeal Swab; Nasopharyngeal(NP) swabs in vial transport medium  Result Value Ref Range Status   SARS Coronavirus 2 by RT PCR NEGATIVE NEGATIVE Final    Comment: (NOTE) SARS-CoV-2 target nucleic acids are NOT DETECTED.  The SARS-CoV-2 RNA is generally detectable in upper respiratory specimens during the acute phase of infection. The lowest concentration  of SARS-CoV-2 viral copies this assay can detect is 138 copies/mL. Marisabel Macpherson negative result does not preclude SARS-Cov-2 infection and should not be used as the sole basis for  treatment or other patient management decisions. Lizann Edelman negative result may occur with  improper specimen collection/handling, submission of specimen other than nasopharyngeal swab, presence of viral mutation(s) within the areas targeted by this assay, and inadequate number of viral copies(<138 copies/mL). Emme Rosenau negative result must be combined with clinical observations, patient history, and epidemiological information. The expected result is Negative.  Fact Sheet for Patients:  EntrepreneurPulse.com.au  Fact Sheet for Healthcare Providers:  IncredibleEmployment.be  This test is no t yet approved or cleared by the [place] FDA and  has been authorized for detection and/or diagnosis of SARS-CoV-2 by FDA under an Emergency Use Authorization (EUA). This EUA will remain  in effect (meaning this test can be used) for the duration of the COVID-19 declaration under Section 564(b)(1) of the Act, 21 U.S.C.section 360bbb-3(b)(1), unless the authorization is terminated  or revoked sooner.       Influenza Sholonda Jobst by PCR NEGATIVE NEGATIVE Final   Influenza B by PCR NEGATIVE NEGATIVE Final    Comment: (NOTE) The Xpert Xpress SARS-CoV-2/FLU/RSV plus assay is intended as an aid in the diagnosis of influenza from Nasopharyngeal swab specimens and should not be used as Tyese Finken sole basis for treatment. Nasal washings and aspirates are unacceptable for Xpert Xpress SARS-CoV-2/FLU/RSV testing.  Fact Sheet for Patients: EntrepreneurPulse.com.au  Fact Sheet for Healthcare Providers: IncredibleEmployment.be  This test is not yet approved or cleared by the [place] FDA and has been authorized for detection and/or diagnosis of SARS-CoV-2 by FDA under an Emergency Use  Authorization (EUA). This EUA will remain in effect (meaning this test can be used) for the duration of the COVID-19 declaration under Section 564(b)(1) of the Act, 21 U.S.C. section 360bbb-3(b)(1), unless the authorization is terminated or revoked.  Performed at [facility], [address] [place] [address], [place], [place] [address]   C Difficile Quick Screen w PCR reflex     Status: Abnormal   Collection Time: 12/06/20  4:00 PM   Specimen: STOOL  Result Value Ref Range Status   C Diff antigen POSITIVE (Kolton Kienle) NEGATIVE Final   C Diff toxin POSITIVE (Florenda Watt) NEGATIVE Final    Comment: CRITICAL RESULT CALLED TO, READ BACK BY AND VERIFIED WITH: CARA LINGLER@1730  12/06/20 BY JONES,T    C Diff interpretation Toxin producing C. difficile detected.  Final    Comment: CRITICAL RESULT CALLED TO, READ BACK BY AND VERIFIED WITH: CARA LINGER @1730  12/06/20 BY JONES,T Performed at [facility], [address], [place], [place] [address]   MRSA PCR Screening     Status: None   Collection Time: 12/11/20  8:30 PM   Specimen: Nasal Mucosa; Nasopharyngeal  Result Value Ref Range Status   MRSA by PCR NEGATIVE NEGATIVE Final    Comment:        The GeneXpert MRSA Assay (FDA approved for NASAL specimens only), is one component of Jakin Pavao comprehensive MRSA colonization surveillance program. It is not intended to diagnose MRSA infection nor to guide or monitor treatment for MRSA infections. Performed at [facility], [place] [place], [place] [address]          Radiology Studies: No results found.      Scheduled Meds: . aspirin EC  81 mg Oral Daily  . atorvastatin  40 mg Oral Daily  . Chlorhexidine Gluconate Cloth  6 each Topical Daily  . dexamethasone (DECADRON) injection  8 mg Intravenous Q6H  . feeding supplement  237 mL Oral BID BM  . folic acid  1 mg Oral Daily  .  multivitamin with minerals  1 tablet Oral Daily  . nutrition supplement (JUVEN)  1 packet Oral BID BM  . thiamine  100 mg Oral  Daily  . vancomycin  125 mg Oral QID   Continuous Infusions: . dextrose 5 % and 0.9% NaCl 125 mL/hr at 12/10/20 1752     LOS: 7 days    Time spent: over 30 min    Lacretia Nicks, MD [facility]   To contact the attending provider between 7A-7P or the covering provider during after hours 7P-7A, please log into the web site [email or web address] and access using universal [place] password for that web site. If you do not have the password, please call the hospital operator.  12/12/2020, 8:22 AM

## 2020-12-12 NOTE — Progress Notes (Signed)
Physical Therapy Treatment Patient Details Name: Stanley Scott MRN: [id number] DOB: 17-Jan-1951 Today's Date: 12/12/2020    History of Present Illness Stanley Scott is a 70 y.o. 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  Patient was unable to provide history, history was obtained from ED PA and ED medical record.  Per report, patient was reported to have had several episodes of loose to watery stools daily for the past week.  He has also had poor appetite, though family encouraged oral hydration.  Patient sustained a fall by landing on his outstretched left upper extremity while being transferred from his bed to the bedside commode (around 2 AM today).  He complained of left thumb, forearm and shoulder pain, but denies head injury.  Patient was seen in the ED on 12/17 and was suspected to have acute cystitis without hematuria, he was discharged home with Keflex at that time.   Patient was reported to be sedentary at baseline and lives with son who provide significant assistance with ADLs.  He denies chest pain, shortness of breath, nausea, vomiting, abdominal pain.    PT Comments    Pt received supine in bed, awake. At rest pt draws hand up under chin and tends to hold LE's in external rotation with knee flexion. Pt able to initiate coming to EOB but required max A to elevate trunk into sitting. Unable to maintain sitting balance EOB due to fwd lean. Pt attempted sit to stand but was unable to support wt through LE's. Max A for return to supine and position in bed. Continue with recommendation for SNF unless family prepared for total care. PT will continue to follow.    Follow Up Recommendations  SNF;Supervision/Assistance - 24 hour     Equipment Recommendations  None  recommended by PT    Recommendations for Other Services       Precautions / Restrictions Precautions Precautions: Fall Restrictions Weight Bearing Restrictions: No    Mobility  Bed Mobility Overal bed mobility: Needs Assistance Bed Mobility: Supine to Sit;Sit to Supine     Supine to sit: Max assist Sit to supine: Max assist   General bed mobility comments: pt able to initiate moving to EOB with sliding LE's to edge and reaching for rail. Unable to lift trunk off bed, max A for elevation into sitting. Pt unable to position self EOB. Max A for LE's back into bed with return to supine  Transfers Overall transfer level: Needs assistance Equipment used: None Transfers: Sit to/from Stand Sit to Stand: Max assist         General transfer comment: attempted sit>stand but pt unable to take wt through LE's. Practiced scoot transfer to R along bedside but pt required max A to perform this as well  Ambulation/Gait             General Gait Details: unable   Stairs             Wheelchair Mobility    Modified Rankin (Stroke Patients Only)       Balance Overall balance assessment: Needs assistance;History of Falls Sitting-balance support: Feet supported;Bilateral upper extremity supported Sitting balance-Leahy Scale: Poor Sitting balance - Comments: fwd lean in sitting, min to mod A to maintain sitting EOB  Cognition Arousal/Alertness: Awake/alert Behavior During Therapy: WFL for tasks assessed/performed Overall Cognitive Status: No family/caregiver present to determine baseline cognitive functioning                                 General Comments: pt with decreased verbalization, answers very basic questions appropriately but cannot engage in higher level conversation. Words slurred at times.      Exercises      General Comments General comments (skin integrity, edema, etc.): BP in sitting  151/102, HR 83 bpm, no dizziness per pt      Pertinent Vitals/Pain Pain Assessment: Faces Faces Pain Scale: Hurts even more Pain Location: generalized and lower back Pain Descriptors / Indicators: Aching;Sore;Grimacing;Guarding Pain Intervention(s): Limited activity within patient's tolerance;Monitored during session    Home Living                      Prior Function            PT Goals (current goals can now be found in the care plan section) Acute Rehab PT Goals Patient Stated Goal: to get stronger PT Goal Formulation: With patient Time For Goal Achievement: 12/20/20 Potential to Achieve Goals: Fair Progress towards PT goals: Not progressing toward goals - comment (weakness/ fatigue)    Frequency    Min 3X/week      PT Plan Current plan remains appropriate    Co-evaluation              AM-PAC PT "6 Clicks" Mobility   Outcome Measure  Help needed turning from your back to your side while in a flat bed without using bedrails?: A Lot Help needed moving from lying on your back to sitting on the side of a flat bed without using bedrails?: A Lot Help needed moving to and from a bed to a chair (including a wheelchair)?: Total Help needed standing up from a chair using your arms (e.g., wheelchair or bedside chair)?: Total Help needed to walk in hospital room?: Total Help needed climbing 3-5 steps with a railing? : Total 6 Click Score: 8    End of Session   Activity Tolerance: Patient limited by fatigue Patient left: in bed;with call bell/phone within reach;with bed alarm set Nurse Communication: Mobility status PT Visit Diagnosis: Unsteadiness on feet (R26.81);Other abnormalities of gait and mobility (R26.89);Muscle weakness (generalized) (M62.81)     Time: 1914-7829 PT Time Calculation (min) (ACUTE ONLY): 25 min  Charges:  $Therapeutic Activity: 23-37 mins                     Lyanne Co, PT  Acute Rehab Services  Pager  [phone] Office [phone]    Stanley Scott 12/12/2020, 10:59 AM

## 2020-12-12 NOTE — Consult Note (Addendum)
Chief Complaint   Chief Complaint  Patient presents with  . Weakness    History of Present Illness  Stanley Scott is a 70 y.o. male initially admitted to [facility] with generalized weakness. Patient is unable to provide history and this is obtained from the EMR.  Upon his admission from [facility],  It was noted that at baseline the patient lives with his son and required significant assistance for basic ADLs.  He was also noted to have loose watery stools   And ultimately diagnosed with C diff.  He has a history of recent stroke about 2 months ago and workup also included MRI of both the brain and cervical spine.  While no new acute stroke was seen, he was noted to have significant spinal cord compression from C3 through C5.  He was therefore transferred to [facility] for neurosurgical evaluation and management.  Past Medical History   Past Medical History:  Diagnosis Date  . Cardiomyopathy (HCC) 09/2020   EF 45-50% w/ inferoapical HK on echo, MV w/ scar, no ischemia at [facility] in setting of CVA  . Carotid artery disease (HCC) 09/2020  . CVA (cerebral vascular accident) (HCC) 09/2020  . Hyperlipidemia LDL goal <70   . Seizure [facility])     Past Surgical History   Past Surgical History:  Procedure Laterality Date  . CAROTID ENDARTERECTOMY Left 10/18/2020   Dr Zella Ball, Renette Butters    Social History   Social History   Tobacco Use  . Smoking status: Never Smoker  . Smokeless tobacco: Never Used  Vaping Use  . Vaping Use: Never used  Substance Use Topics  . Alcohol use: Not Currently  . Drug use: Never    Medications   Prior to Admission medications   Medication Sig Start Date End Date Taking? Authorizing Provider  acetaminophen (TYLENOL) 500 MG tablet Take 500 mg by mouth every 6 (six) hours as needed for moderate pain or headache.   Yes [provider]  atorvastatin (LIPITOR) 40 MG tablet Take 40 mg by mouth daily. 10/08/20  Yes [provider]  cephALEXin  (KEFLEX) 500 MG capsule Take 1 capsule (500 mg total) by mouth 4 (four) times daily. 11/25/20  Yes Vanetta Mulders, MD  ergocalciferol (VITAMIN D2) 1.25 MG (50000 UT) capsule Take 50,000 Units by mouth once a week. 10/08/20  Yes [provider]  folic acid (FOLVITE) 1 MG tablet Take 1 mg by mouth daily. 10/08/20  Yes [provider]  metoprolol tartrate (LOPRESSOR) 50 MG tablet Take 50 mg by mouth 2 (two) times daily. 11/30/20  Yes [provider]  pantoprazole (PROTONIX) 40 MG tablet Take 40 mg by mouth daily. 10/08/20  Yes [provider]  sodium chloride 1 g tablet Take 1 g by mouth 2 (two) times daily with a meal. 10/08/20  Yes [provider]  thiamine 100 MG tablet Take 100 mg by mouth daily. Patient not taking: No sig reported 10/08/20   [provider]    Allergies  Not on File  Review of Systems  Review of Systems  Constitutional: Positive for weight loss.  Gastrointestinal: Positive for diarrhea.  Musculoskeletal: Positive for falls and myalgias.  Neurological: Positive for sensory change and weakness.    Neurologic Exam  Ill appearing, with poor dentition  Awake, alert, oriented to person, not year or place Answers simple questions Mild right facial droop Significant generalized muscular atrophy of BUE/BLE Does not adequately participate in exam, appears to have decreased proximal UE  strength,  At least antigravity proximal BLE   Imaging  MRI cervical spine personally reviewed demonstrating   Straightening of normal cervical lordosis.  There is broad-based disc osteophyte complex with significant disc degeneration and loss of height at C3-4 and C4-5 including osteophytosis versus ossification of the PLL behind the C4 vertebral body.  These are contributing to severe central stenosis with spinal cord compression from C3-4 through C4-5.  There is associated signal change within the spinal cord.  Impression  - 70 y.o.  male   With multiple medical comorbidities including recent ischemic stroke, gastric AVM, failure to thrive with chronic malnutrition, history of epilepsy, chronic anemia.  He has presented with worsening generalized weakness, likely multifactorial.  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   Under normal circumstances we may consider surgical decompression for this stenosis however the patient's baseline appears to be relatively poor requiring significant assistance for normal ADLs, the patient has multiple associated medical comorbidities including recent stroke, chronic malnutrition,chronic anemia, and more recently dysrhythmia including intermittent sinus pauses.  I therefore do not think that operative decompression is a reasonable option for this patient as it likely carries an extremely high risk of major perioperative morbidity or possibly even mortality.  Plan  - Would transition to oral steroid taper over the next 7 days - Cont with PT/OT - Consider palliative care consulation  I have reviewed the plan above with the primary service and we both agree that this patient's risk of perioperative complication is likely quite high and conservative treatment of his cervical stenosis is most reasonable. I will speak with the patient's son later this afternoon regarding the same.   Lisbeth Renshaw, MD [facility]

## 2020-12-12 NOTE — Progress Notes (Signed)
OT Cancellation Note  Patient Details Name: Stanley Scott MRN: [id number] DOB: Jul 24, 1951   Cancelled Treatment:    Reason Eval/Treat Not Completed: Other (comment) (Pt with planned corpectomy this date (12/12/20). OT will hold until after sx until appropriate to mobilize)  Dalphine Handing, MSOT, OTR/L Acute Rehabilitation Services [facility] Office Number: [phone] Pager: [phone]  Dalphine Handing 12/12/2020, 8:59 AM

## 2020-12-12 NOTE — Progress Notes (Addendum)
Calorie Count Note  48 hour calorie count ordered.  Diet: DYS 2 (currently NPO for procedure)  Supplements: Ensure Enlive, Magic Cup, Juven  Pt transferred for [facility] from [facility] on 12/09/20. Pt NPO for scheduled corpectomy today.   Day 1 Dinner: 735 kcal, 31.5 grams protein (100% EE, 100% MC, 100% Juven) Breakfast: 232 kcal, 13.9 grams protein (80% eggs, 80% sausage/gravy) Lunch: 657.5 kcal, 29.4 grams protein (100% pot roast/gravy, 100% mashed pots, 95% carrots, 100% Magic Cup, 25% sweet tea)  12/31 Supplements: 890 kcal, 45 grams protein (100% EE x 2, 100% Juven x 2)  Day 1 Total intake: 2515 kcal (132% of estimated needs)  120 protein (126% of estimated needs)  Day 2 Breakfast: nothing documented- per [employer] Meal ordering system, pt did not order breakfast Lunch: 645 kcals, 27 grams protein Dinner: 661 kcals, 27 grams protein Supplements: 2 Ensure Enlive (700 kcals, 40 grams protein); 2 Juven (190 kcals, 5 grams protein)    Total intake: 2196 kcal (>100% of minimum estimated needs)  99 grams protein (100% of minimum estimated needs)  Average Total intake: 2356 kcal (>100% of minimum estimated needs)  110 protein (100% of minimum estimated needs)  Nutrition Dx: Severe Malnutritionrelated to chronic illnessas evidenced by moderate fat depletion,severe fat depletion,moderate muscle depletion,severe muscle depletion,percent weight loss; ongoing  Goal: Patient will meet greater than 90% of estimated needs; progressing   Intervention:   -D/c calorie count  -Once diet is advanced, resume:   Ensure Enlive po BID, each supplement provides 350 kcal and 20 grams of protein  Magic cup BID with meals, each supplement provides 290 kcal and 9 grams of protein  Juven BID, each packet provides 95 calories, 2.5 grams of protein (collagen), and 9.8 grams of carbohydrate (3 grams sugar); also contains 7 grams of L-arginine and L-glutamine, 300 mg vitamin C, 15 mg vitamin E, 1.2  mcg vitamin B-12, 9.5 mg zinc, 200 mg calcium, and 1.5 g  Calcium Beta-hydroxy-Beta-methylbutyrate to support wound healing  MVI with minerals daily   Levada Schilling, RD, LDN, CDCES Registered Dietitian II Certified Diabetes Care and Education Specialist Please refer to AMION for RD and/or RD on-call/weekend/after hours pager

## 2020-12-13 DIAGNOSIS — R197 Diarrhea, unspecified: Secondary | ICD-10-CM | POA: Diagnosis not present

## 2020-12-13 LAB — CBC WITH DIFFERENTIAL/PLATELET
Abs Immature Granulocytes: 0.1 10*3/uL — ABNORMAL HIGH (ref 0.00–0.07)
Basophils Absolute: 0 10*3/uL (ref 0.0–0.1)
Basophils Relative: 0 %
Eosinophils Absolute: 0 10*3/uL (ref 0.0–0.5)
Eosinophils Relative: 0 %
HCT: 28.8 % — ABNORMAL LOW (ref 39.0–52.0)
Hemoglobin: 9.5 g/dL — ABNORMAL LOW (ref 13.0–17.0)
Immature Granulocytes: 1 %
Lymphocytes Relative: 5 %
Lymphs Abs: 0.3 10*3/uL — ABNORMAL LOW (ref 0.7–4.0)
MCH: 29.4 pg (ref 26.0–34.0)
MCHC: 33 g/dL (ref 30.0–36.0)
MCV: 89.2 fL (ref 80.0–100.0)
Monocytes Absolute: 0.3 10*3/uL (ref 0.1–1.0)
Monocytes Relative: 4 %
Neutro Abs: 6.4 10*3/uL (ref 1.7–7.7)
Neutrophils Relative %: 90 %
Platelets: 252 10*3/uL (ref 150–400)
RBC: 3.23 MIL/uL — ABNORMAL LOW (ref 4.22–5.81)
RDW: 21.1 % — ABNORMAL HIGH (ref 11.5–15.5)
WBC: 7.1 10*3/uL (ref 4.0–10.5)
nRBC: 0 % (ref 0.0–0.2)

## 2020-12-13 LAB — COMPREHENSIVE METABOLIC PANEL
ALT: 31 U/L (ref 0–44)
AST: 35 U/L (ref 15–41)
Albumin: 1.9 g/dL — ABNORMAL LOW (ref 3.5–5.0)
Alkaline Phosphatase: 101 U/L (ref 38–126)
Anion gap: 9 (ref 5–15)
BUN: 16 mg/dL (ref 8–23)
CO2: 26 mmol/L (ref 22–32)
Calcium: 8.4 mg/dL — ABNORMAL LOW (ref 8.9–10.3)
Chloride: 107 mmol/L (ref 98–111)
Creatinine, Ser: 0.53 mg/dL — ABNORMAL LOW (ref 0.61–1.24)
GFR, Estimated: >60 mL/min (ref >60)
Glucose, Bld: 172 mg/dL — ABNORMAL HIGH (ref 70–99)
Potassium: 3.6 mmol/L (ref 3.5–5.1)
Sodium: 142 mmol/L (ref 135–145)
Total Bilirubin: 0.5 mg/dL (ref 0.3–1.2)
Total Protein: 6 g/dL — ABNORMAL LOW (ref 6.5–8.1)

## 2020-12-13 LAB — GLUCOSE, CAPILLARY
Glucose-Capillary: 161 mg/dL — ABNORMAL HIGH (ref 70–99)
Glucose-Capillary: 186 mg/dL — ABNORMAL HIGH (ref 70–99)
Glucose-Capillary: 231 mg/dL — ABNORMAL HIGH (ref 70–99)
Glucose-Capillary: 228 mg/dL — ABNORMAL HIGH (ref 70–99)

## 2020-12-13 LAB — FOLATE: Folate: 16.7 ng/mL (ref >5.9)

## 2020-12-13 LAB — RPR: RPR Ser Ql: NONREACTIVE

## 2020-12-13 LAB — MAGNESIUM: Magnesium: 1.7 mg/dL (ref 1.7–2.4)

## 2020-12-13 LAB — FERRITIN: Ferritin: 724 ng/mL — ABNORMAL HIGH (ref 24–336)

## 2020-12-13 LAB — IRON AND TIBC
Iron: 74 ug/dL (ref 45–182)
Saturation Ratios: 40 % — ABNORMAL HIGH (ref 17.9–39.5)
TIBC: 186 ug/dL — ABNORMAL LOW (ref 250–450)
UIBC: 112 ug/dL

## 2020-12-13 LAB — PHOSPHORUS: Phosphorus: 3.6 mg/dL (ref 2.5–4.6)

## 2020-12-13 MED ORDER — DEXAMETHASONE 2 MG PO TABS
2.0000 mg | ORAL_TABLET | Freq: Every day | ORAL | Status: AC
  Administered 2020-12-19 – 2020-12-20 (×2): 2 mg via ORAL
  Filled 2020-12-13 (×2): qty 1

## 2020-12-13 MED ORDER — DEXAMETHASONE 2 MG PO TABS
1.0000 mg | ORAL_TABLET | Freq: Every day | ORAL | Status: DC
Start: 2020-12-21 — End: 2020-12-13

## 2020-12-13 MED ORDER — DEXAMETHASONE 2 MG PO TABS
1.0000 mg | ORAL_TABLET | Freq: Every day | ORAL | Status: AC
  Administered 2020-12-21 – 2020-12-22 (×2): 1 mg via ORAL
  Filled 2020-12-13 (×2): qty 1

## 2020-12-13 MED ORDER — DEXAMETHASONE SODIUM PHOSPHATE 4 MG/ML IJ SOLN
4.0000 mg | Freq: Two times a day (BID) | INTRAMUSCULAR | Status: AC
  Administered 2020-12-15 – 2020-12-17 (×4): 4 mg via INTRAVENOUS
  Filled 2020-12-13 (×4): qty 1

## 2020-12-13 MED ORDER — DEXAMETHASONE SODIUM PHOSPHATE 4 MG/ML IJ SOLN: 4.0000 mg | Freq: Four times a day (QID) | INTRAMUSCULAR | Status: DC

## 2020-12-13 MED ORDER — DEXAMETHASONE SODIUM PHOSPHATE 4 MG/ML IJ SOLN
4.0000 mg | Freq: Two times a day (BID) | INTRAMUSCULAR | Status: DC
Start: 2020-12-15 — End: 2020-12-13

## 2020-12-13 MED ORDER — DEXAMETHASONE SODIUM PHOSPHATE 4 MG/ML IJ SOLN
4.0000 mg | INTRAMUSCULAR | Status: AC
  Administered 2020-12-17 – 2020-12-18 (×2): 4 mg via INTRAVENOUS
  Filled 2020-12-13 (×2): qty 1

## 2020-12-13 MED ORDER — PANTOPRAZOLE SODIUM 40 MG PO TBEC
40.0000 mg | DELAYED_RELEASE_TABLET | Freq: Every day | ORAL | Status: DC
  Administered 2020-12-13 – 2020-12-23 (×11): 40 mg via ORAL
  Filled 2020-12-13 (×10): qty 1

## 2020-12-13 MED ORDER — DEXAMETHASONE SODIUM PHOSPHATE 4 MG/ML IJ SOLN: 4.0000 mg | INTRAMUSCULAR | Status: DC

## 2020-12-13 MED ORDER — DEXAMETHASONE SODIUM PHOSPHATE 4 MG/ML IJ SOLN
4.0000 mg | Freq: Four times a day (QID) | INTRAMUSCULAR | Status: AC
  Administered 2020-12-13 – 2020-12-15 (×8): 4 mg via INTRAVENOUS
  Filled 2020-12-13 (×8): qty 1

## 2020-12-13 MED ORDER — DEXAMETHASONE 2 MG PO TABS: 2.0000 mg | ORAL_TABLET | Freq: Every day | ORAL | Status: DC

## 2020-12-13 NOTE — Progress Notes (Addendum)
PROGRESS NOTE    Stanley Scott  BDZ:[id number] DOB: 10/04/1951 DOA: Dec 30, 2020 PCP: Toma Deiters, MD   Chief Complaint  Patient presents with  . 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  In the emergency room, responded to IV fluid hydration.  Stool tested positive for C. difficile.  Started on oral vancomycin.  Remains extremely debilitated.  12/29 ,noted to be more weak on both sides.  A stat MRI head is negative for acute stroke. 12/30, with persistent quadriparesis, MRI of the cervical spine was done that shows severe cervical stenosis and spinal cord edema.  Started on steroids.  Discussed with neurosurgery and initiated transfer to [facility] for possible C-spine decompression.  He was seen by neurosurgery on 1/3, but thought to be too high risk for surgery given the risk of perioperative morbidity and possible mortality.  Plan for steroid taper over next 7 days, therapy evaluation as well as palliative care.  Assessment & Plan:   Principal Problem:   Acute diarrhea Active Problems:   Generalized weakness   Hypothermia   Failure to thrive in adult   Hypotension   Symptomatic anemia   Hypoalbuminemia   Prolonged QT interval   Gastric AVM   CVA (cerebral vascular accident) (HCC)   SIRS (systemic inflammatory response syndrome) (HCC)   Hypokalemia   Essential hypertension   Pressure injury of skin   Protein-calorie malnutrition, severe  C. difficile diarrhea: Clinically improving.  Diarrhea improved.  On oral vancomycin 12/28-present.  Plan for 10 days.   Physical  debility/deconditioning with recent history of stroke and hemiparesis: Severe cervical spine disease/C-spine compression: Functional quadriplegia: Acute Metabolic Encephalopathy: Remains is debilitated and deconditioned due to multiple medical issues. Continue to work with PT OT.  Will ultimately need rehab.   Repeat MRI negative for acute stroke, chronic infarct L frontal lobe.  B12, folic acid levels normal. 12/30, MRI of the cervical spine shows severe central canal and bilateral foraminal stenosis at C3-4 and C4-5.  There is edema within cervical cord from approximately the superior endplate of C3 through the C4-5 level consistent with myelomalacia or ischemia due to compression.  Moderately severe to severe bilateral foraminal narrowing at C5-6 is worse on R.  The ventral thecal sac is nearly effaced by disc at this level.  Severe bilateral foraminal narrowing at C6-7.  The central canal is open at this level.    Patient reported fall about 4 weeks ago Case discussed with neurosurgery, per Dr. Conchita Paris on 1/3, too high risk for surgery Transition to oral steroid taper over next 7 days, PT/OT, palliative care c/s A&Ox1-2 (thought December), unclear what his baseline is, sounds like this is a decline, but difficult to tell with discussion with son -> b1 (pending), b12 (wnl), tsh (wnl), ammonia (wnl), rpr nonreactive, high dose thiamine  Sinus Bradycardia  Pauses: hold metoprolol.  Per cardiology, concern that this maybe neurally mediated given spinal cord edema.  No indication for pacemaker at this time - for anesthesia purposes, recommended isuprel or dopamine available with transcutaneous pacer pads nearby in case nerve stimulation with surgery drops HR.   Bradycardia has improved, today has had  HR in 90s and even some tachycardia noted - follow, may need to resume low dose metop   Electrolyte abnormalities, hypokalemia/hypomagnesemia: With poor oral intake.  Replaced aggressively.  Levels  improved.    History of stroke: No new stroke.  On aspirin and statin.  PT OT and speech continue to see him.  Dysphagia 2, thin liquids - appreciate speech assistance  Hyperglycemia  Steroid Induced Hyperglycemia: SSI, A1c is 5.7.  Failure to thrive/severe protein calorie malnutrition: Augment nutrition.  Calorie count.  Fluctuating intake. RD consult, appreciate recs  Hypertension: On metoprolol.  Stable.  Hold with bradycardia above.  Anemia of chronic disease: Hemoglobin 7.2.  Transfuse 2 units of PRBC.  Hemoccult negative.  Responded appropriately. Follow anemia labs - consistent with AOCD   Signs/Symptoms: moderate fat depletion,severe fat depletion,moderate muscle depletion,severe muscle depletion,percent weight loss Percent weight loss: 7.4 % (9 lbs x 6 weeks) Interventions: MVI,Magic cup,Ensure Enlive (each supplement provides 350kcal and 20 grams of protein)  DVT prophylaxis:SCD Code Status: full  Family Communication: none at bedside - called son, no answer today Disposition:   Status is: Inpatient  Remains inpatient appropriate because:Inpatient level of care appropriate due to severity of illness   Dispo: The patient is from: Home              Anticipated d/c is to: pending              Anticipated d/c date is: > 3 days              Patient currently is not medically stable to d/c.       Consultants:   neurosurgery  Procedures:   none  Antimicrobials: Anti-infectives (From admission, onward)   Start     Dose/Rate Route Frequency Ordered Stop   12/06/20 1830  vancomycin (VANCOCIN) 50 mg/mL oral solution 125 mg        125 mg Oral 4 times daily 12/06/20 1738 12/16/20 1759   12/15/2020 1300  cefTRIAXone (ROCEPHIN) 1 g in sodium chloride 0.9 % 100 mL IVPB  Status:  Discontinued        1 g 200 mL/hr over 30 Minutes Intravenous Every 24 hours 12/15/2020 1259 15-Dec-2020 2218         Subjective: Denies any pain Confused, difficult to  understand  Objective: Vitals:   12/12/20 2324 12/13/20 0300 12/13/20 0901 12/13/20 1123  BP:  (!) 162/89 (!) 160/91 (!) 160/99  Pulse: 75 73 73 96  Resp:  15 16 17   Temp: (!) 97.5 F (36.4 C) 97.9 F (36.6 C) (!) 97 F (36.1 C) (!) 97.4 F (36.3 C)  TempSrc: Axillary Axillary Axillary Axillary  SpO2: 99% 100% 100% 100%  Weight:      Height:        Intake/Output Summary (Last 24 hours) at 12/13/2020 1340 Last data filed at 12/13/2020 1023 Gross per 24 hour  Intake 646.59 ml  Output 4400 ml  Net -3753.41 ml   Filed Weights   12/08/20 0500 12/10/20 0500 12/10/20 0602  Weight: 52.8 kg 57.8 kg 57.7 kg    Examination:  General: No acute distress. Cardiovascular: Heart sounds show a regular rate, and rhythm.  Lungs: Clear to auscultation bilaterally  Abdomen: Soft, nontender, nondistended Neurological: Alert, but confused. Moves all extremities 4 - able to lift upper and lower extremities off bed, discoordinated. Cranial nerves II through XII grossly intact. Skin: Warm and dry. No rashes or lesions. Extremities: No clubbing or cyanosis. No edema.  Data Reviewed: I have personally reviewed following labs and imaging studies  CBC: Recent Labs  Lab 12/08/20 0433 12/10/20 0609 12/11/20 0747 12/12/20 0143 12/13/20 0613  WBC 7.7 5.1 4.2 3.6* 7.1  NEUTROABS 6.5 4.4 3.6 2.9 6.4  HGB 9.5* 8.0* 9.2* 9.3* 9.5*  HCT 30.8* 27.0* 30.6* 30.0* 28.8*  MCV 91.7 94.4 93.6 90.9 89.2  PLT 247 224 225 234 252    Basic Metabolic Panel: Recent Labs  Lab 12/07/20 1401 12/10/20 0609 12/10/20 2022 12/11/20 0747 12/12/20 0143 12/13/20 0613  NA 136 142 142 143 142 142  K 3.8 4.0 4.1 4.1 3.8 3.6  CL 105 113* 114* 112* 111 107  CO2 21* 22 20* 19* 25 26  GLUCOSE 102* 190* 287* 155* 230* 172*  BUN 10 22 20 19 21 16   CREATININE 0.47* 0.48* 0.59* 0.59* 0.55* 0.53*  CALCIUM 8.2* 8.3* 8.3* 8.7* 8.7* 8.4*  MG 1.9 1.8 1.8  --  1.8 1.7  PHOS 4.4 3.4  --   --  2.4* 3.6     GFR: Estimated Creatinine Clearance: 71.1 mL/min (A) (by C-G formula based on SCr of 0.53 mg/dL (L)).  Liver Function Tests: Recent Labs  Lab 12/07/20 1401 12/11/20 0747 12/12/20 0143 12/13/20 0613  AST 48* 34 46* 35  ALT 13 20 31 31   ALKPHOS 100 95 105 101  BILITOT 0.6 0.6 0.7 0.5  PROT 6.0* 6.2* 5.7* 6.0*  ALBUMIN 1.9* 1.9* 1.9* 1.9*    CBG: Recent Labs  Lab 12/12/20 1321 12/12/20 1727 12/12/20 2322 12/13/20 0603 12/13/20 1257  GLUCAP 169* 153* 219* 161* 186*     Recent Results (from the past 240 hour(s))  Blood Culture (routine x 2)     Status: None   Collection Time: December 07, 2020 12:17 PM   Specimen: BLOOD  Result Value Ref Range Status   Specimen Description BLOOD BLOOD RIGHT FOREARM  Final   Special Requests   Final    BOTTLES DRAWN AEROBIC AND ANAEROBIC Blood Culture adequate volume   Culture   Final    NO GROWTH 5 DAYS Performed at [facility], [address], [place], [address] [place]    Report Status 12/10/2020 FINAL  Final  Blood Culture (routine x 2)     Status: None   Collection Time: 12/07/2020 12:45 PM   Specimen: BLOOD  Result Value Ref Range Status   Specimen Description BLOOD LEFT ANTECUBITAL  Final   Special Requests   Final    BOTTLES DRAWN AEROBIC AND ANAEROBIC Blood Culture results may not be optimal due to an inadequate volume of blood received in culture bottles   Culture   Final    NO GROWTH 5 DAYS Performed at [facility], [address], [place], [address] [place]    Report Status 12/10/2020 FINAL  Final  Urine culture     Status: None   Collection Time: 12/07/20 12:57 PM   Specimen: In/Out Cath Urine  Result Value Ref Range Status   Specimen Description   Final    IN/OUT CATH URINE Performed at [facility], [address] [place], [address] [place]    Special Requests   Final    NONE Performed at [facility], [address] [place] [address], [place], [address] [place]    Culture   Final    NO GROWTH Performed at [facility], [address] [place], [address] [place]    Report Status 12/07/2020 FINAL  Final  Resp Panel by RT-PCR (Flu A&B, Covid) Nasopharyngeal  Swab     Status: None   Collection Time: 11/10/2020  2:12 PM   Specimen: Nasopharyngeal Swab; Nasopharyngeal(NP) swabs in vial transport medium  Result Value Ref Range Status   SARS Coronavirus 2 by RT PCR NEGATIVE NEGATIVE Final    Comment: (NOTE) SARS-CoV-2 target nucleic acids are NOT DETECTED.  The SARS-CoV-2 RNA is generally detectable in upper respiratory specimens during the acute phase of infection. The lowest concentration of SARS-CoV-2 viral copies this assay can detect is 138 copies/mL. A negative result does not preclude SARS-Cov-2 infection and should not be used as the sole basis for treatment or other patient management decisions. A negative result may occur with  improper specimen collection/handling, submission of specimen other than nasopharyngeal swab, presence of viral mutation(s) within the areas targeted by this assay, and inadequate number of viral copies(<138 copies/mL). A negative result must be combined with clinical observations, patient history, and epidemiological information. The expected result is Negative.  Fact Sheet for Patients:  BloggerCourse.com  Fact Sheet for Healthcare Providers:  SeriousBroker.it  This test is no t yet approved or cleared by the [place] FDA and  has been authorized for detection and/or diagnosis of SARS-CoV-2 by FDA under an Emergency Use Authorization (EUA). This EUA will remain  in effect (meaning this test can be used) for the duration of the COVID-19 declaration under Section 564(b)(1) of the Act, 21 U.S.C.section 360bbb-3(b)(1), unless the authorization is terminated  or revoked sooner.       Influenza A by PCR NEGATIVE NEGATIVE Final   Influenza B by PCR NEGATIVE NEGATIVE Final    Comment: (NOTE) The Xpert Xpress  SARS-CoV-2/FLU/RSV plus assay is intended as an aid in the diagnosis of influenza from Nasopharyngeal swab specimens and should not be used as a sole basis for treatment. Nasal washings and aspirates are unacceptable for Xpert Xpress SARS-CoV-2/FLU/RSV testing.  Fact Sheet for Patients: BloggerCourse.com  Fact Sheet for Healthcare Providers: SeriousBroker.it  This test is not yet approved or cleared by the [place] FDA and has been authorized for detection and/or diagnosis of SARS-CoV-2 by FDA under an Emergency Use Authorization (EUA). This EUA will remain in effect (meaning this test can be used) for the duration of the COVID-19 declaration under Section 564(b)(1) of the Act, 21 U.S.C. section 360bbb-3(b)(1), unless the authorization is terminated or revoked.  Performed at [facility], [address], [place], [place] [address]   C Difficile Quick Screen w PCR reflex     Status: Abnormal   Collection Time: 12/06/20  4:00 PM   Specimen: STOOL  Result Value Ref Range Status   C Diff antigen POSITIVE (A) NEGATIVE Final   C Diff toxin POSITIVE (A) NEGATIVE Final    Comment: CRITICAL RESULT CALLED TO, READ BACK BY AND VERIFIED WITH: CARA LINGLER@1730  12/06/20 BY JONES,T    C Diff interpretation Toxin producing C. difficile detected.  Final    Comment: CRITICAL RESULT CALLED TO, READ BACK BY AND VERIFIED WITH: CARA LINGER @1730  12/06/20 BY JONES,T Performed at [facility], [address], [place], [place]   MRSA PCR Screening     Status: None   Collection Time: 12/11/20  8:30 PM   Specimen: Nasal Mucosa; Nasopharyngeal  Result Value Ref Range Status   MRSA by PCR NEGATIVE NEGATIVE Final    Comment:        The GeneXpert MRSA Assay (FDA approved for NASAL specimens only), is one component of a comprehensive MRSA colonization surveillance program. It is  not intended to diagnose MRSA infection nor to guide  or monitor treatment for MRSA infections. Performed at [facility], [place] [address], [place], [place] [address]          Radiology Studies: No results found.      Scheduled Meds: . aspirin EC  81 mg Oral Daily  . atorvastatin  40 mg Oral Daily  . Chlorhexidine Gluconate Cloth  6 each Topical Daily  . dexamethasone (DECADRON) injection  8 mg Intravenous Q6H  . feeding supplement  237 mL Oral BID BM  . folic acid  1 mg Oral Daily  . insulin aspart  0-9 Units Subcutaneous TID WC  . multivitamin with minerals  1 tablet Oral Daily  . nutrition supplement (JUVEN)  1 packet Oral BID BM  . [START ON 12/21/2020] thiamine  100 mg Oral Daily  . vancomycin  125 mg Oral QID   Continuous Infusions: . dextrose 5 % and 0.9% NaCl 125 mL/hr at 12/13/20 0447  . thiamine injection 500 mg (12/13/20 1313)   Followed by  . [START ON 12/16/2020] thiamine injection       LOS: 8 days    Time spent: over 1 min    Fayrene Helper, MD [facility]   To contact the attending provider between 7A-7P or the covering provider during after hours 7P-7A, please log into the web site [email or web address] and access using universal [place] password for that web site. If you do not have the password, please call the hospital operator.  12/13/2020, 1:40 PM

## 2020-12-13 NOTE — Progress Notes (Signed)
  Speech Language Pathology Treatment: Dysphagia  Patient Details Name: Stanley Scott MRN: [id number] DOB: 1951/08/18 Today's Date: 12/13/2020 Time: 1040-1100 SLP Time Calculation (min) (ACUTE ONLY): 20 min  Assessment / Plan / Recommendation Clinical Impression  Pt seen with am meal. Swallowing ability is consistent with prior visits, pt tolerating dys 2 diet and thin liquids with assisted feeding and assist for precautions such as positioning and slow rate. Multiple swallows are spontaneous, but pt does not swallow again with verbal cues. Recommend pt continue current diet with staff assistance. No further SLP interventions needed.   HPI HPI: Stanley Scott is a 70 y.o. 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  Patient was unable to provide history, history was obtained from ED PA and ED medical record.  Per report, patient was reported to have had several episodes of loose to watery stools daily for the past week.  He has also had poor appetite, though family encouraged oral hydration.  Patient sustained a fall by landing on his outstretched left upper extremity while being transferred from his bed to the bedside commode (around 2 AM today).  He complained of left thumb, forearm and shoulder pain, but denies head injury.  Patient was seen in the ED on 12/17 and was suspected to have acute cystitis without hematuria, he was discharged home with Keflex at that time.   Patient was reported to be sedentary at baseline and lives with son who provide significant assistance with ADLs.  He denies chest pain, shortness of breath, nausea, vomiting, abdominal pain.      SLP Plan  Continue with current plan of care       Recommendations  Diet recommendations: Dysphagia 2 (fine  chop);Thin liquid Liquids provided via: Straw Medication Administration: Whole meds with puree Supervision: Full supervision/cueing for compensatory strategies Compensations: Slow rate;Small sips/bites;Multiple dry swallows after each bite/sip;Clear throat intermittently;Effortful swallow;Chin tuck Postural Changes and/or Swallow Maneuvers: Seated upright 90 degrees;Upright 30-60 min after meal                Oral Care Recommendations: Oral care BID Follow up Recommendations: Skilled Nursing facility SLP Visit Diagnosis: Dysphagia, oropharyngeal phase (R13.12) Plan: Continue with current plan of care       GO                Nykerria Macconnell, Riley Nearing 12/13/2020, 11:30 AM

## 2020-12-14 DIAGNOSIS — Z515 Encounter for palliative care: Secondary | ICD-10-CM | POA: Diagnosis not present

## 2020-12-14 DIAGNOSIS — G9520 Unspecified cord compression: Secondary | ICD-10-CM | POA: Diagnosis not present

## 2020-12-14 DIAGNOSIS — R197 Diarrhea, unspecified: Secondary | ICD-10-CM | POA: Diagnosis not present

## 2020-12-14 LAB — CBC WITH DIFFERENTIAL/PLATELET
Abs Immature Granulocytes: 0.11 10*3/uL — ABNORMAL HIGH (ref 0.00–0.07)
Basophils Absolute: 0 10*3/uL (ref 0.0–0.1)
Basophils Relative: 0 %
Eosinophils Absolute: 0 10*3/uL (ref 0.0–0.5)
Eosinophils Relative: 0 %
HCT: 27.9 % — ABNORMAL LOW (ref 39.0–52.0)
Hemoglobin: 9.2 g/dL — ABNORMAL LOW (ref 13.0–17.0)
Immature Granulocytes: 3 %
Lymphocytes Relative: 9 %
Lymphs Abs: 0.3 10*3/uL — ABNORMAL LOW (ref 0.7–4.0)
MCH: 29.3 pg (ref 26.0–34.0)
MCHC: 33 g/dL (ref 30.0–36.0)
MCV: 88.9 fL (ref 80.0–100.0)
Monocytes Absolute: 0.3 10*3/uL (ref 0.1–1.0)
Monocytes Relative: 8 %
Neutro Abs: 3.1 10*3/uL (ref 1.7–7.7)
Neutrophils Relative %: 80 %
Platelets: 215 10*3/uL (ref 150–400)
RBC: 3.14 MIL/uL — ABNORMAL LOW (ref 4.22–5.81)
RDW: 21 % — ABNORMAL HIGH (ref 11.5–15.5)
WBC: 3.8 10*3/uL — ABNORMAL LOW (ref 4.0–10.5)
nRBC: 0 % (ref 0.0–0.2)

## 2020-12-14 LAB — COMPREHENSIVE METABOLIC PANEL
ALT: 34 U/L (ref 0–44)
AST: 30 U/L (ref 15–41)
Albumin: 1.8 g/dL — ABNORMAL LOW (ref 3.5–5.0)
Alkaline Phosphatase: 100 U/L (ref 38–126)
Anion gap: 9 (ref 5–15)
BUN: 19 mg/dL (ref 8–23)
CO2: 24 mmol/L (ref 22–32)
Calcium: 8.3 mg/dL — ABNORMAL LOW (ref 8.9–10.3)
Chloride: 107 mmol/L (ref 98–111)
Creatinine, Ser: 0.68 mg/dL (ref 0.61–1.24)
GFR, Estimated: >60 mL/min (ref >60)
Glucose, Bld: 230 mg/dL — ABNORMAL HIGH (ref 70–99)
Potassium: 3.5 mmol/L (ref 3.5–5.1)
Sodium: 140 mmol/L (ref 135–145)
Total Bilirubin: 0.8 mg/dL (ref 0.3–1.2)
Total Protein: 5.5 g/dL — ABNORMAL LOW (ref 6.5–8.1)

## 2020-12-14 LAB — GLUCOSE, CAPILLARY
Glucose-Capillary: 178 mg/dL — ABNORMAL HIGH (ref 70–99)
Glucose-Capillary: 189 mg/dL — ABNORMAL HIGH (ref 70–99)
Glucose-Capillary: 222 mg/dL — ABNORMAL HIGH (ref 70–99)
Glucose-Capillary: 83 mg/dL (ref 70–99)

## 2020-12-14 LAB — MAGNESIUM: Magnesium: 1.7 mg/dL (ref 1.7–2.4)

## 2020-12-14 LAB — PHOSPHORUS: Phosphorus: 2.8 mg/dL (ref 2.5–4.6)

## 2020-12-14 MED ORDER — AMLODIPINE BESYLATE 5 MG PO TABS
5.0000 mg | ORAL_TABLET | Freq: Every day | ORAL | Status: DC
  Administered 2020-12-14 – 2020-12-15 (×2): 5 mg via ORAL
  Filled 2020-12-14 (×2): qty 1

## 2020-12-14 NOTE — Progress Notes (Signed)
Occupational Therapy Treatment Patient Details Name: Stanley Scott MRN: [id number] DOB: 1951/04/07 Today's Date: 12/14/2020    History of present illness 70 year old gentleman with history of recent left MCA stroke on 10/21 with residual right hemiparesis, left carotid stenosis status post carotid endarterectomy, treated with tPA for stroke, hypertension, history of GI bleeding from gastric AVM who was recently treated with Keflex for UTI and came back to the emergency room with multiple loose stools, electrolyte abnormalities, hypothermic and low blood pressures, positive for CDiff. Pt found to have severe cervical spine stenosis on 12/30 MRI, transferred to [facility].   OT comments  Pt agreeable to OT/PT session this date. Pt required maxA to roll R<>L, he required maxA to progress to seated position EOB. Pt presents with BUE weakness and gross motor and fine motor coordination limitations impacting his ability to independently feed himself and complete grooming tasks. PTA, pt was able to feed himself. Pt unable to progress into standing this session, he required totalA+2 to attempt sit<>stand x2 from EOB. While sitting EOB pt demonstrated right lateral lean and required minA-maxA for stability sitting upright. Pt will continue to benefit from skilled OT services to maximize safety and independence with ADL/IADL and functional mobility. Will continue to follow acutely and progress as tolerated.    Follow Up Recommendations  SNF    Equipment Recommendations  None recommended by OT    Recommendations for Other Services      Precautions / Restrictions Precautions Precautions: Fall Restrictions Weight Bearing Restrictions: No       Mobility Bed Mobility Overal bed mobility: Needs Assistance Bed Mobility: Supine to Sit;Sit to Supine;Rolling Rolling: Max assist   Supine to sit: Max assist Sit to supine: Max assist   General bed mobility comments: Pt initiating progression of BLE to  EOB;required cues for sequencing, required maxA to progress trunk to upright posture;maxA for BLE and trunk return to supine  Transfers Overall transfer level: Needs assistance Equipment used: None Transfers: Sit to/from Stand Sit to Stand: Total assist;+2 physical assistance;+2 safety/equipment         General transfer comment: attempted sit<>standx2 from EOB, pt would require totalA+2, unable to safely progress pt to full upright standing    Balance Overall balance assessment: Needs assistance;History of Falls Sitting-balance support: Feet supported;Bilateral upper extremity supported Sitting balance-Leahy Scale: Poor Sitting balance - Comments: right lateral lean in sitting, min to mod A to maintain sitting EOB Postural control: Right lateral lean                                 ADL either performed or assessed with clinical judgement   ADL Overall ADL's : Needs assistance/impaired Eating/Feeding: Maximal assistance;Sitting;Bed level Eating/Feeding Details (indicate cue type and reason): limited ability to maintain grasp on utensil Grooming: Wash/dry face;Moderate assistance;Bed level Grooming Details (indicate cue type and reason): required hand over hadn support from therapist to maintain grasp and use coordinated UE movements     Lower Body Bathing: Maximal assistance;Bed level Lower Body Bathing Details (indicate cue type and reason): required hand over hand assistance for coordinated BUE movements           Toilet Transfer Details (indicate cue type and reason): rolling R<>L for posterior care, pt incontinent of bowels           General ADL Comments: attempted x2 stand from EOB, totalA+2, unable to safely progress pt to upright position;pt significantly limited by  weakness, cognition     Vision   Vision Assessment?:  (difficult to assess due to cognitive limitations) Additional Comments: attempted to assess, but pt unable to hold head  still/follow commands correctly   Perception     Praxis      Cognition Arousal/Alertness: Awake/alert Behavior During Therapy: WFL for tasks assessed/performed Overall Cognitive Status: No family/caregiver present to determine baseline cognitive functioning                                 General Comments: pt with limited conversation, appears to have slurred speech at times;pt reports he is at a [employer], able to report the year with 1 cue, unable to stated month, situation, place correctly;pt required increased time for processing information, increased cues for body awarness/postural corrections        Exercises     Shoulder Instructions       General Comments BP in supine from earlier in day 186/114. pt BP in sitting 119/76, 95/76 in sitting after activity, 120/77 after return to supine. Pt denies symptoms of orthostatic BP throughout session;old sacral wound appeared to have necrotic tissue.     Pertinent Vitals/ Pain       Pain Assessment: No/denies pain Faces Pain Scale: Hurts a little bit Pain Location: generalized Pain Descriptors / Indicators: Grimacing Pain Intervention(s): Monitored during session  Home Living                                          Prior Functioning/Environment              Frequency  Min 2X/week        Progress Toward Goals  OT Goals(current goals can now be found in the care plan section)  Progress towards OT goals: Progressing toward goals  Acute Rehab OT Goals Patient Stated Goal: to get stronger OT Goal Formulation: With patient Time For Goal Achievement: 12/21/20 Potential to Achieve Goals: Fair ADL Goals Pt Will Perform Eating: with min assist;sitting Pt Will Perform Grooming: with min assist;sitting Pt Will Transfer to Toilet: with mod assist;stand pivot transfer;bedside commode Pt Will Perform Toileting - Clothing Manipulation and hygiene: with mod assist;sitting/lateral leans;sit  to/from stand Pt/caregiver will Perform Home Exercise Program: Increased strength;Both right and left upper extremity;With minimal assist;With written HEP provided  Plan Discharge plan remains appropriate    Co-evaluation    PT/OT/SLP Co-Evaluation/Treatment: Yes Reason for Co-Treatment: Complexity of the patient's impairments (multi-system involvement);For patient/therapist safety;To address functional/ADL transfers PT goals addressed during session: Mobility/safety with mobility;Balance;Strengthening/ROM OT goals addressed during session: ADL's and self-care      AM-PAC OT "6 Clicks" Daily Activity     Outcome Measure   Help from another person eating meals?: A Lot Help from another person taking care of personal grooming?: A Lot Help from another person toileting, which includes using toliet, bedpan, or urinal?: Total Help from another person bathing (including washing, rinsing, drying)?: A Lot Help from another person to put on and taking off regular upper body clothing?: A Lot Help from another person to put on and taking off regular lower body clothing?: A Lot 6 Click Score: 11    End of Session    OT Visit Diagnosis: Muscle weakness (generalized) (M62.81);Repeated falls (R29.6);Other symptoms and signs involving cognitive function   Activity Tolerance Patient tolerated  treatment well   Patient Left in bed;with call bell/phone within reach;with bed alarm set   Nurse Communication Mobility status        Time: 0354-6568 OT Time Calculation (min): 34 min  Charges: OT General Charges $OT Visit: 1 Visit OT Treatments $Self Care/Home Management : 8-22 mins  Rosey Bath OTR/L Acute Rehabilitation Services Office: [phone]    Rebeca Alert 12/14/2020, 3:21 PM

## 2020-12-14 NOTE — Progress Notes (Signed)
PROGRESS NOTE    Stanley Scott  GEZ:[id number] DOB: 02-13-1951 DOA: 11/28/2020 PCP: Toma Deiters, MD     Brief Narrative:  Stanley Scott is a 70 year old male with history of recent left MCA stroke on 10/21 with residual right hemiparesis, left carotid stenosis status post carotid endarterectomy, treated with tPA for stroke, hypertension, history of GI bleeding from gastric AVM who was recently treated with Keflex for UTI and came back to the emergency room with multiple loose stools, electrolyte abnormalities, hypothermic and low blood pressures. In the emergency room, responded to IV fluid hydration. Stool tested positive for C. difficile. Started on oral vancomycin. Remains extremely debilitated.  On 12/29, patient was noted to be more weak on both sides. Stat MRI head is negative for acute stroke.  On 12/30, patient continued to have persistent quadriparesis. MRI of the cervical spine was done that shows severe cervical stenosis and spinal cord edema. Started on steroids. Discussed with neurosurgery and initiated transfer to [facility] for possible C-spine decompression. He was seen by neurosurgery on 1/3, but thought to be too high risk for surgery given the risk of perioperative morbidity and possible mortality. Plan for steroid taper over next 7 days, therapy evaluation as well as palliative care.  New events last 24 hours / Subjective: Patient without acute complaints of worsening diarrhea, abdominal pain, nausea or vomiting.  Assessment & Plan:   Principal Problem:   Acute diarrhea Active Problems:   Generalized weakness   Hypothermia   Failure to thrive in adult   Hypotension   Symptomatic anemia   Hypoalbuminemia   Prolonged QT interval   Gastric AVM   CVA (cerebral vascular accident) (HCC)   SIRS (systemic inflammatory response syndrome) (HCC)   Hypokalemia   Essential hypertension   Pressure injury of skin   Protein-calorie malnutrition, severe   C.  difficile diarrhea -Continue oral vancomycin 12/28-1/6 for total 10-day treatment  Severe cervical spine stenosis C3-C5, C-spine compression leading to physical debility, functional quadriplegia -Patient seen by neurosurgery, deemed to be too high risk for surgical procedure.  Recommended for steroid taper over the next week -PT OT recommending SNF placement -Palliative care consulted for goals of care discussion  Sinus bradycardia -Continued to hold nodal agents.  No further cardiology work-up.  Follow-up with cardiology as needed.  Cardiology signed off 1/5  HTN -Metoprolol on hold due to bradycardia. Start amlodipine   History of stroke with residual right hemiparesis -Continue aspirin, statin  Severe protein calorie malnutrition -Dietitian consulted  Anemia of chronic disease -Status post 2 unit packed red blood cell 12/28 -Hemoglobin remains stable    In agreement with assessment of the pressure ulcer as below:  Pressure Injury 12/06/20 Sacrum Medial Stage 2 -  Partial thickness loss of dermis presenting as a shallow open injury with a red, pink wound bed without slough. (Active)  12/06/20 1700  Location: Sacrum  Location Orientation: Medial  Staging: Stage 2 -  Partial thickness loss of dermis presenting as a shallow open injury with a red, pink wound bed without slough.  Wound Description (Comments):   Present on Admission: Yes     Nutrition Problem: Severe Malnutrition Etiology: chronic illness   DVT prophylaxis:  SCDs Start: 11/20/2020 1913  Code Status: Full code Family Communication: No family at bedside Disposition Plan:  Status is: Inpatient  Remains inpatient appropriate because:Unsafe d/c plan   Dispo: The patient is from: Home  Anticipated d/c is to: SNF              Anticipated d/c date is: 1 day              Patient currently is medically stable to d/c.  Patient currently on p.o. vancomycin, steroid taper.  No surgical intervention  planned per neurosurgery.  SNF placement pending.    Antimicrobials:  Anti-infectives (From admission, onward)   Start     Dose/Rate Route Frequency Ordered Stop   12/06/20 1830  vancomycin (VANCOCIN) 50 mg/mL oral solution 125 mg        125 mg Oral 4 times daily 12/06/20 1738 12/16/20 1759   11/18/2020 1300  cefTRIAXone (ROCEPHIN) 1 g in sodium chloride 0.9 % 100 mL IVPB  Status:  Discontinued        1 g 200 mL/hr over 30 Minutes Intravenous Every 24 hours 11/25/2020 1259 11/22/2020 2218        Objective: Vitals:   12/14/20 0007 12/14/20 0202 12/14/20 0500 12/14/20 0816  BP: (!) 167/86 (!) 159/93  (!) 183/106  Pulse: 85 88  73  Resp: 18 18  11   Temp: 98.6 F (37 C) 97.6 F (36.4 C)  97.6 F (36.4 C)  TempSrc: Axillary Axillary  Oral  SpO2: 100% 100%  100%  Weight:   57.6 kg   Height:        Intake/Output Summary (Last 24 hours) at 12/14/2020 1345 Last data filed at 12/14/2020 1043 Gross per 24 hour  Intake 2869.82 ml  Output 1600 ml  Net 1269.82 ml   Filed Weights   12/10/20 0500 12/10/20 0602 12/14/20 0500  Weight: 57.8 kg 57.7 kg 57.6 kg    Examination:  General exam: Appears calm and comfortable  Respiratory system: Clear to auscultation. Respiratory effort normal. No respiratory distress.  Cardiovascular system: S1 & S2 heard, RRR. No murmurs. No pedal edema. Gastrointestinal system: Abdomen is nondistended, soft and nontender. Normal bowel sounds heard. Central nervous system: Alert, dysarthria, able to move all 4 extremities to command  Extremities: Symmetric in appearance    Data Reviewed: I have personally reviewed following labs and imaging studies  CBC: Recent Labs  Lab 12/10/20 0609 12/11/20 0747 12/12/20 0143 12/13/20 0613 12/14/20 0328  WBC 5.1 4.2 3.6* 7.1 3.8*  NEUTROABS 4.4 3.6 2.9 6.4 3.1  HGB 8.0* 9.2* 9.3* 9.5* 9.2*  HCT 27.0* 30.6* 30.0* 28.8* 27.9*  MCV 94.4 93.6 90.9 89.2 88.9  PLT 224 225 234 252 517   Basic Metabolic Panel: Recent  Labs  Lab 12/07/20 1401 12/10/20 0609 12/10/20 2022 12/11/20 0747 12/12/20 0143 12/13/20 0613 12/14/20 0328  NA 136 142 142 143 142 142 140  K 3.8 4.0 4.1 4.1 3.8 3.6 3.5  CL 105 113* 114* 112* 111 107 107  CO2 21* 22 20* 19* 25 26 24   GLUCOSE 102* 190* 287* 155* 230* 172* 230*  BUN 10 22 20 19 21 16 19   CREATININE 0.47* 0.48* 0.59* 0.59* 0.55* 0.53* 0.68  CALCIUM 8.2* 8.3* 8.3* 8.7* 8.7* 8.4* 8.3*  MG 1.9 1.8 1.8  --  1.8 1.7 1.7  PHOS 4.4 3.4  --   --  2.4* 3.6 2.8   GFR: Estimated Creatinine Clearance: 71 mL/min (by C-G formula based on SCr of 0.68 mg/dL). Liver Function Tests: Recent Labs  Lab 12/07/20 1401 12/11/20 0747 12/12/20 0143 12/13/20 0613 12/14/20 0328  AST 48* 34 46* 35 30  ALT 13 20 31 31  34  ALKPHOS 100  95 105 101 100  BILITOT 0.6 0.6 0.7 0.5 0.8  PROT 6.0* 6.2* 5.7* 6.0* 5.5*  ALBUMIN 1.9* 1.9* 1.9* 1.9* 1.8*   No results for input(s): LIPASE, AMYLASE in the last 168 hours. Recent Labs  Lab 12/12/20 1120  AMMONIA 17   Coagulation Profile: No results for input(s): INR, PROTIME in the last 168 hours. Cardiac Enzymes: No results for input(s): CKTOTAL, CKMB, CKMBINDEX, TROPONINI in the last 168 hours. BNP (last 3 results) No results for input(s): PROBNP in the last 8760 hours. HbA1C: Recent Labs    12/12/20 1120  HGBA1C 5.7*   CBG: Recent Labs  Lab 12/13/20 1257 12/13/20 1655 12/13/20 2131 12/14/20 0006 12/14/20 0602  GLUCAP 186* 231* 228* 222* 189*   Lipid Profile: No results for input(s): CHOL, HDL, LDLCALC, TRIG, CHOLHDL, LDLDIRECT in the last 72 hours. Thyroid Function Tests: Recent Labs    12/12/20 1120  TSH 3.192   Anemia Panel: Recent Labs    12/12/20 1120 12/13/20 0613  VITAMINB12 820  --   FOLATE  --  16.7  FERRITIN  --  724*  TIBC  --  186*  IRON  --  74   Sepsis Labs: No results for input(s): PROCALCITON, LATICACIDVEN in the last 168 hours.  Recent Results (from the past 240 hour(s))  Blood Culture  (routine x 2)     Status: None   Collection Time: 12/02/2020 12:17 PM   Specimen: BLOOD  Result Value Ref Range Status   Specimen Description BLOOD BLOOD RIGHT FOREARM  Final   Special Requests   Final    BOTTLES DRAWN AEROBIC AND ANAEROBIC Blood Culture adequate volume   Culture   Final    NO GROWTH 5 DAYS Performed at [facility], [address] [place], [place] [address]    Report Status 12/10/2020 FINAL  Final  Blood Culture (routine x 2)     Status: None   Collection Time: 12/01/2020 12:45 PM   Specimen: BLOOD  Result Value Ref Range Status   Specimen Description BLOOD LEFT ANTECUBITAL  Final   Special Requests   Final    BOTTLES DRAWN AEROBIC AND ANAEROBIC Blood Culture results may not be optimal due to an inadequate volume of blood received in culture bottles   Culture   Final    NO GROWTH 5 DAYS Performed at [facility], [address], [place], [place] [address]    Report Status 12/10/2020 FINAL  Final  Urine culture     Status: None   Collection Time: 11/19/2020 12:57 PM   Specimen: In/Out Cath Urine  Result Value Ref Range Status   Specimen Description   Final    IN/OUT CATH URINE Performed at [facility], [address], [place], [place] [address]    Special Requests   Final    NONE Performed at [facility], [address] [place], [place] [address]    Culture   Final    NO GROWTH Performed at [facility], [address] [place], [place] [address]    Report Status 12/07/2020 FINAL  Final  Resp Panel by RT-PCR (Flu A&B, Covid) Nasopharyngeal Swab     Status: None   Collection Time: 11/25/2020  2:12 PM   Specimen: Nasopharyngeal Swab; Nasopharyngeal(NP) swabs in vial transport medium  Result Value Ref Range Status   SARS Coronavirus 2 by RT PCR NEGATIVE NEGATIVE Final    Comment: (NOTE) SARS-CoV-2 target nucleic acids are NOT DETECTED.  The SARS-CoV-2 RNA is generally detectable  in upper respiratory specimens during the acute phase of infection. The  lowest concentration of SARS-CoV-2 viral copies this assay can detect is 138 copies/mL. A negative result does not preclude SARS-Cov-2 infection and should not be used as the sole basis for treatment or other patient management decisions. A negative result may occur with  improper specimen collection/handling, submission of specimen other than nasopharyngeal swab, presence of viral mutation(s) within the areas targeted by this assay, and inadequate number of viral copies(<138 copies/mL). A negative result must be combined with clinical observations, patient history, and epidemiological information. The expected result is Negative.  Fact Sheet for Patients:  BloggerCourse.com  Fact Sheet for Healthcare Providers:  SeriousBroker.it  This test is no t yet approved or cleared by the [place] FDA and  has been authorized for detection and/or diagnosis of SARS-CoV-2 by FDA under an Emergency Use Authorization (EUA). This EUA will remain  in effect (meaning this test can be used) for the duration of the COVID-19 declaration under Section 564(b)(1) of the Act, 21 U.S.C.section 360bbb-3(b)(1), unless the authorization is terminated  or revoked sooner.       Influenza A by PCR NEGATIVE NEGATIVE Final   Influenza B by PCR NEGATIVE NEGATIVE Final    Comment: (NOTE) The Xpert Xpress SARS-CoV-2/FLU/RSV plus assay is intended as an aid in the diagnosis of influenza from Nasopharyngeal swab specimens and should not be used as a sole basis for treatment. Nasal washings and aspirates are unacceptable for Xpert Xpress SARS-CoV-2/FLU/RSV testing.  Fact Sheet for Patients: BloggerCourse.com  Fact Sheet for Healthcare Providers: SeriousBroker.it  This test is not yet approved or cleared by the [place] FDA and has been authorized for detection and/or diagnosis of SARS-CoV-2 by FDA under  an Emergency Use Authorization (EUA). This EUA will remain in effect (meaning this test can be used) for the duration of the COVID-19 declaration under Section 564(b)(1) of the Act, 21 U.S.C. section 360bbb-3(b)(1), unless the authorization is terminated or revoked.  Performed at [facility], [address], [place], [place] [address]   C Difficile Quick Screen w PCR reflex     Status: Abnormal   Collection Time: 12/06/20  4:00 PM   Specimen: STOOL  Result Value Ref Range Status   C Diff antigen POSITIVE (A) NEGATIVE Final   C Diff toxin POSITIVE (A) NEGATIVE Final    Comment: CRITICAL RESULT CALLED TO, READ BACK BY AND VERIFIED WITH: CARA LINGLER@1730  12/06/20 BY JONES,T    C Diff interpretation Toxin producing C. difficile detected.  Final    Comment: CRITICAL RESULT CALLED TO, READ BACK BY AND VERIFIED WITH: CARA LINGER @1730  12/06/20 BY JONES,T Performed at [facility], [address], [place], [place]   MRSA PCR Screening     Status: None   Collection Time: 12/11/20  8:30 PM   Specimen: Nasal Mucosa; Nasopharyngeal  Result Value Ref Range Status   MRSA by PCR NEGATIVE NEGATIVE Final    Comment:        The GeneXpert MRSA Assay (FDA approved for NASAL specimens only), is one component of a comprehensive MRSA colonization surveillance program. It is not intended to diagnose MRSA infection nor to guide or monitor treatment for MRSA infections. Performed at [facility], [address] [place], [place]       Radiology Studies: No results found.    Scheduled Meds: . aspirin EC  81 mg Oral Daily  . atorvastatin  40 mg Oral Daily  .  Chlorhexidine Gluconate Cloth  6 each Topical Daily  . dexamethasone (DECADRON) injection  4 mg Intravenous Q6H   Followed by  . [START ON 12/15/2020] dexamethasone (DECADRON) injection  4 mg Intravenous Q12H   Followed by  . [START ON 12/17/2020] dexamethasone (DECADRON) injection  4 mg Intravenous Q24H    Followed by  . [START ON 12/19/2020] dexamethasone  2 mg Oral Daily   Followed by  . [START ON 12/21/2020] dexamethasone  1 mg Oral Daily  . feeding supplement  237 mL Oral BID BM  . folic acid  1 mg Oral Daily  . insulin aspart  0-9 Units Subcutaneous TID WC  . multivitamin with minerals  1 tablet Oral Daily  . nutrition supplement (JUVEN)  1 packet Oral BID BM  . pantoprazole  40 mg Oral Daily  . [START ON 12/21/2020] thiamine  100 mg Oral Daily  . vancomycin  125 mg Oral QID   Continuous Infusions: . dextrose 5 % and 0.9% NaCl 125 mL/hr at 12/14/20 1251  . thiamine injection 500 mg (12/14/20 1308)   Followed by  . [START ON 12/16/2020] thiamine injection       LOS: 9 days      Time spent: 25 minutes   Noralee Stain, DO [facility] 12/14/2020, 1:45 PM   Available via Epic secure chat 7am-7pm After these hours, please refer to coverage provider listed on [email or web address]

## 2020-12-14 NOTE — Progress Notes (Signed)
Daily Progress Note   Patient Name: Stanley Scott       Date: 12/14/2020 DOB: 01/12/1951  Age: 70 y.o. MRN#: [id number] Attending Physician: Noralee Stain, DO Primary Care Physician: Toma Deiters, MD Admit Date: December 21, 2020  Reason for Consultation/Follow-up: To discuss complex medical decision making related to patient's goals of care  Visited patient at bedside.  He is awake and orientated.  Cheerful fellow.  He's giggling at the sit com on TV.  Subjective: Patient tells me he has lived in [place] on and off.  He had 3 sons but 1 passed away.  He used to be a Designer, industrial/product for [employer].  He comments that he can't believe all of that is gone now.  He tells me he is a religious man.   I asked about problems in his neck.  He replies yes, he was brought here for surgery but due to the snow Monday they couldn't do it.  I explained that the doctors felt the surgery wasn't safe for him so they were not going to do it.  I asked the patient if he could walk.  He replied that yes he could get up and walk around.  PT notes indicate he is a max assist and unable to bear weight thru his lower extremities.  Harvie Heck, patient's son was supposed to meet me at bedside at 4:00 pm.  Unfortunately he had a emergency come up at work but he was able to call me at 4:15 in his father's room.  We had a frank conversation over the phone.  I explained that as Kamon is not going to have surgery his cord compression will not improve and will get worse over time.  His father's extremities will become weaker.  Raynor is bedbound and is already suffering some of the deterioration that comes from being bed bound such as infections (UTI, C - diff) and wounds (sacral).  We discussed the PT evaluation indicating  that Machai is a max assist.  He is unable to bear weight on his legs but can sit on edge of bed with some assistance.  I asked Harvie Heck about code status.   If his father were to become much sicker, so sick that his heart stopped and he stopped breathing and he died - should we aggressively attempt to  resuscitate him using chest compressions, shock and life support?   Randy thoughtfully responded "I'll have to ask him about that."  Harvie Heck invited me to be a part of the conversation with him tomorrow 1/6 at 4:00 pm.   Assessment: Very pleasant man.  Speaks a little slowly after CVA.  Unable to lift right arm to shake my hand.  Unable to have surgery.  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 In the emergency room, responded to IV fluid hydration. Stool tested positive for C. difficile. Started on oral vancomycin. Remains extremely debilitated.  12/30, with persistent quadriparesis, MRI of the cervical spine was done that shows severe cervical stenosis and spinal cord edema. Started on steroids. Discussed with neurosurgery and initiated transfer to [facility] for possible C-spine decompression.  He was evaluated by neurosurgery who felt he was a poor candidate for surgery.  They recommended a steroid taper and Palliative care.    Length of Stay: 9   Vital Signs: BP (!) 183/106 (BP Location: Right Arm)   Pulse 73   Temp 97.6 F (36.4 C) (Oral)   Resp 11   Ht 5\' 5"  (1.651 m)   Wt 57.6 kg   SpO2 100%   BMI 21.13 kg/m  SpO2: SpO2: 100 % O2 Device: O2 Device:  Room Air O2 Flow Rate: O2 Flow Rate (L/min): 2 L/min       Palliative Assessment/Data: 30%     Palliative Care Plan    Recommendations/Plan:  Continue current care.   PMT will plan to meet on 1/6 at bedside at 4:00 pm.  Code Status:  Full code  Prognosis:   < 6 months would not be surprising given progressive quadriparesis, recent CVA, CAD, and severe malnutrition.   Discharge Planning:  To Be Determined.   Likely SNF with Palliative to Follow  Care plan was discussed with son  Thank you for allowing the Palliative Medicine Team to assist in the care of this patient.  Total time spent:  35 min.     Greater than 50%  of this time was spent counseling and coordinating care related to the above assessment and plan.  Roselyn Bering, PA-C Palliative Medicine  Please contact Palliative MedicineTeam phone at [phone] for questions and concerns between 7 am - 7 pm.   Please see AMION for individual provider pager numbers.

## 2020-12-14 NOTE — Progress Notes (Signed)
[facility] will sign off.   Medication Recommendations:  Hold nodal agents Other recommendations (labs, testing, etc):  No further cardiac work-up Follow up as an outpatient:  Can follow-up with Cardiology as needed  Laurance Flatten, MD

## 2020-12-14 NOTE — Progress Notes (Signed)
Physical Therapy Treatment Patient Details Name: Stanley Scott MRN: [id number] DOB: 1951/09/19 Today's Date: 12/14/2020    History of Present Illness Stanley Scott is a 70 y.o. 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  Patient was unable to provide history, history was obtained from ED PA and ED medical record.  Per report, patient was reported to have had several episodes of loose to watery stools daily for the past week.  He has also had poor appetite, though family encouraged oral hydration.  Patient sustained a fall by landing on his outstretched left upper extremity while being transferred from his bed to the bedside commode (around 2 AM today).  He complained of left thumb, forearm and shoulder pain, but denies head injury.  Patient was seen in the ED on 12/17 and was suspected to have acute cystitis without hematuria, he was discharged home with Keflex at that time.   Patient was reported to be sedentary at baseline and lives with son who provide significant assistance with ADLs.  He denies chest pain, shortness of breath, nausea, vomiting, abdominal pain.    PT Comments    Pt tolerates treatment well although he remains very weak in all extremities. Pt demonstrates spasticity and increased tone in BUEs limiting his functional use for ADLs. Pt is unable to stand with assistance of 2 therapists today due to LE weakness. Pt also demonstrates a preference for R lateral lean when sitting. Pt BP low during session despite use of SCDs, pt may benefit from TED hose to limit orthostatic hypotension. PT continues to recommend SNF placement.   Follow Up Recommendations  SNF;Supervision/Assistance - 24 hour     Equipment Recommendations  None recommended by PT (mechanical lift)     Recommendations for Other Services       Precautions / Restrictions Precautions Precautions: Fall Restrictions Weight Bearing Restrictions: No    Mobility  Bed Mobility Overal bed mobility: Needs Assistance Bed Mobility: Supine to Sit;Sit to Supine Rolling: Max assist   Supine to sit: Max assist Sit to supine: Max assist   General bed mobility comments: pt able to initiate moving to EOB with sliding LE's to edge and reaching for rail. Unable to lift trunk off bed, max A for elevation into sitting. Pt unable to position self EOB. Max A for LE's back into bed with return to supine  Transfers Overall transfer level: Needs assistance Equipment used: None Transfers: Sit to/from Stand Sit to Stand: Max assist         General transfer comment: attempted sit>stand but pt unable to take wt through LE's. Practiced scoot transfer to R along bedside but pt required max A to perform this as well  Ambulation/Gait                 Stairs             Wheelchair Mobility    Modified Rankin (Stroke Patients Only)       Balance Overall balance assessment: Needs assistance;History of Falls Sitting-balance support: Feet supported;Bilateral upper extremity supported Sitting balance-Leahy Scale: Poor Sitting balance - Comments: fwd lean in sitting, min to mod A to maintain sitting EOB Postural control: Right lateral lean  Cognition Arousal/Alertness: Awake/alert Behavior During Therapy: WFL for tasks assessed/performed Overall Cognitive Status: No family/caregiver present to determine baseline cognitive functioning                                 General Comments: pt with decreased verbalization, answers very basic questions appropriately but cannot engage in higher level conversation. Words slurred at times.      Exercises General Exercises - Upper Extremity Shoulder Flexion: PROM;Both;5 reps Shoulder  Extension: PROM;Both;5 reps Shoulder ABduction: PROM;Both;5 reps Shoulder ADduction: PROM;Both;5 reps Elbow Flexion: PROM;Both;5 reps Elbow Extension: PROM;Both;5 reps Wrist Flexion: PROM;Both;5 reps Wrist Extension: PROM;Both;5 reps Digit Composite Flexion: PROM;Both;5 reps Composite Extension: PROM;Both;5 reps    General Comments General comments (skin integrity, edema, etc.): BP in supine from earlier in day 186/114. pt BP in sitting 119/76, 95/76 in sitting after activity, 120/77 after return to supine. Pt denies symptoms of orthostatic BP throughout session      Pertinent Vitals/Pain Pain Assessment: Faces Faces Pain Scale: Hurts even more Pain Location: generalized and lower back Pain Descriptors / Indicators: Aching;Sore;Grimacing;Guarding Pain Intervention(s): Monitored during session    Home Living                      Prior Function            PT Goals (current goals can now be found in the care plan section) Acute Rehab PT Goals Patient Stated Goal: to get stronger Progress towards PT goals: Not progressing toward goals - comment (weakness, increased tone)    Frequency    Min 3X/week      PT Plan Current plan remains appropriate    Co-evaluation PT/OT/SLP Co-Evaluation/Treatment: Yes Reason for Co-Treatment: Complexity of the patient's impairments (multi-system involvement);Necessary to address cognition/behavior during functional activity;For patient/therapist safety;To address functional/ADL transfers PT goals addressed during session: Mobility/safety with mobility;Balance;Strengthening/ROM        AM-PAC PT "6 Clicks" Mobility   Outcome Measure  Help needed turning from your back to your side while in a flat bed without using bedrails?: A Lot Help needed moving from lying on your back to sitting on the side of a flat bed without using bedrails?: A Lot Help needed moving to and from a bed to a chair (including a wheelchair)?: Total Help  needed standing up from a chair using your arms (e.g., wheelchair or bedside chair)?: Total Help needed to walk in hospital room?: Total Help needed climbing 3-5 steps with a railing? : Total 6 Click Score: 8    End of Session   Activity Tolerance: Patient tolerated treatment well Patient left: in bed;with call bell/phone within reach;with bed alarm set Nurse Communication: Mobility status;Need for lift equipment PT Visit Diagnosis: Unsteadiness on feet (R26.81);Other abnormalities of gait and mobility (R26.89);Muscle weakness (generalized) (M62.81)     Time: 4562-5638 PT Time Calculation (min) (ACUTE ONLY): 34 min  Charges:  $Therapeutic Activity: 8-22 mins                     Arlyss Gandy, PT, DPT Acute Rehabilitation Pager: [phone]    Arlyss Gandy 12/14/2020, 3:13 PM

## 2020-12-15 ENCOUNTER — Inpatient Hospital Stay (HOSPITAL_COMMUNITY): Payer: Medicare Other

## 2020-12-15 DIAGNOSIS — Z515 Encounter for palliative care: Secondary | ICD-10-CM | POA: Diagnosis not present

## 2020-12-15 DIAGNOSIS — R627 Adult failure to thrive: Secondary | ICD-10-CM | POA: Diagnosis not present

## 2020-12-15 DIAGNOSIS — R197 Diarrhea, unspecified: Secondary | ICD-10-CM | POA: Diagnosis not present

## 2020-12-15 DIAGNOSIS — G9520 Unspecified cord compression: Secondary | ICD-10-CM | POA: Diagnosis not present

## 2020-12-15 LAB — GLUCOSE, CAPILLARY
Glucose-Capillary: 164 mg/dL — ABNORMAL HIGH (ref 70–99)
Glucose-Capillary: 140 mg/dL — ABNORMAL HIGH (ref 70–99)
Glucose-Capillary: 162 mg/dL — ABNORMAL HIGH (ref 70–99)
Glucose-Capillary: 181 mg/dL — ABNORMAL HIGH (ref 70–99)

## 2020-12-15 IMAGING — DX DG CHEST 1V PORT
1 series · 1 of 1 positions shown · non-contrast
Comparison: [DATE]

CLINICAL DATA: Airway aspiration

EXAM:
PORTABLE CHEST 1 VIEW

[chest ap]
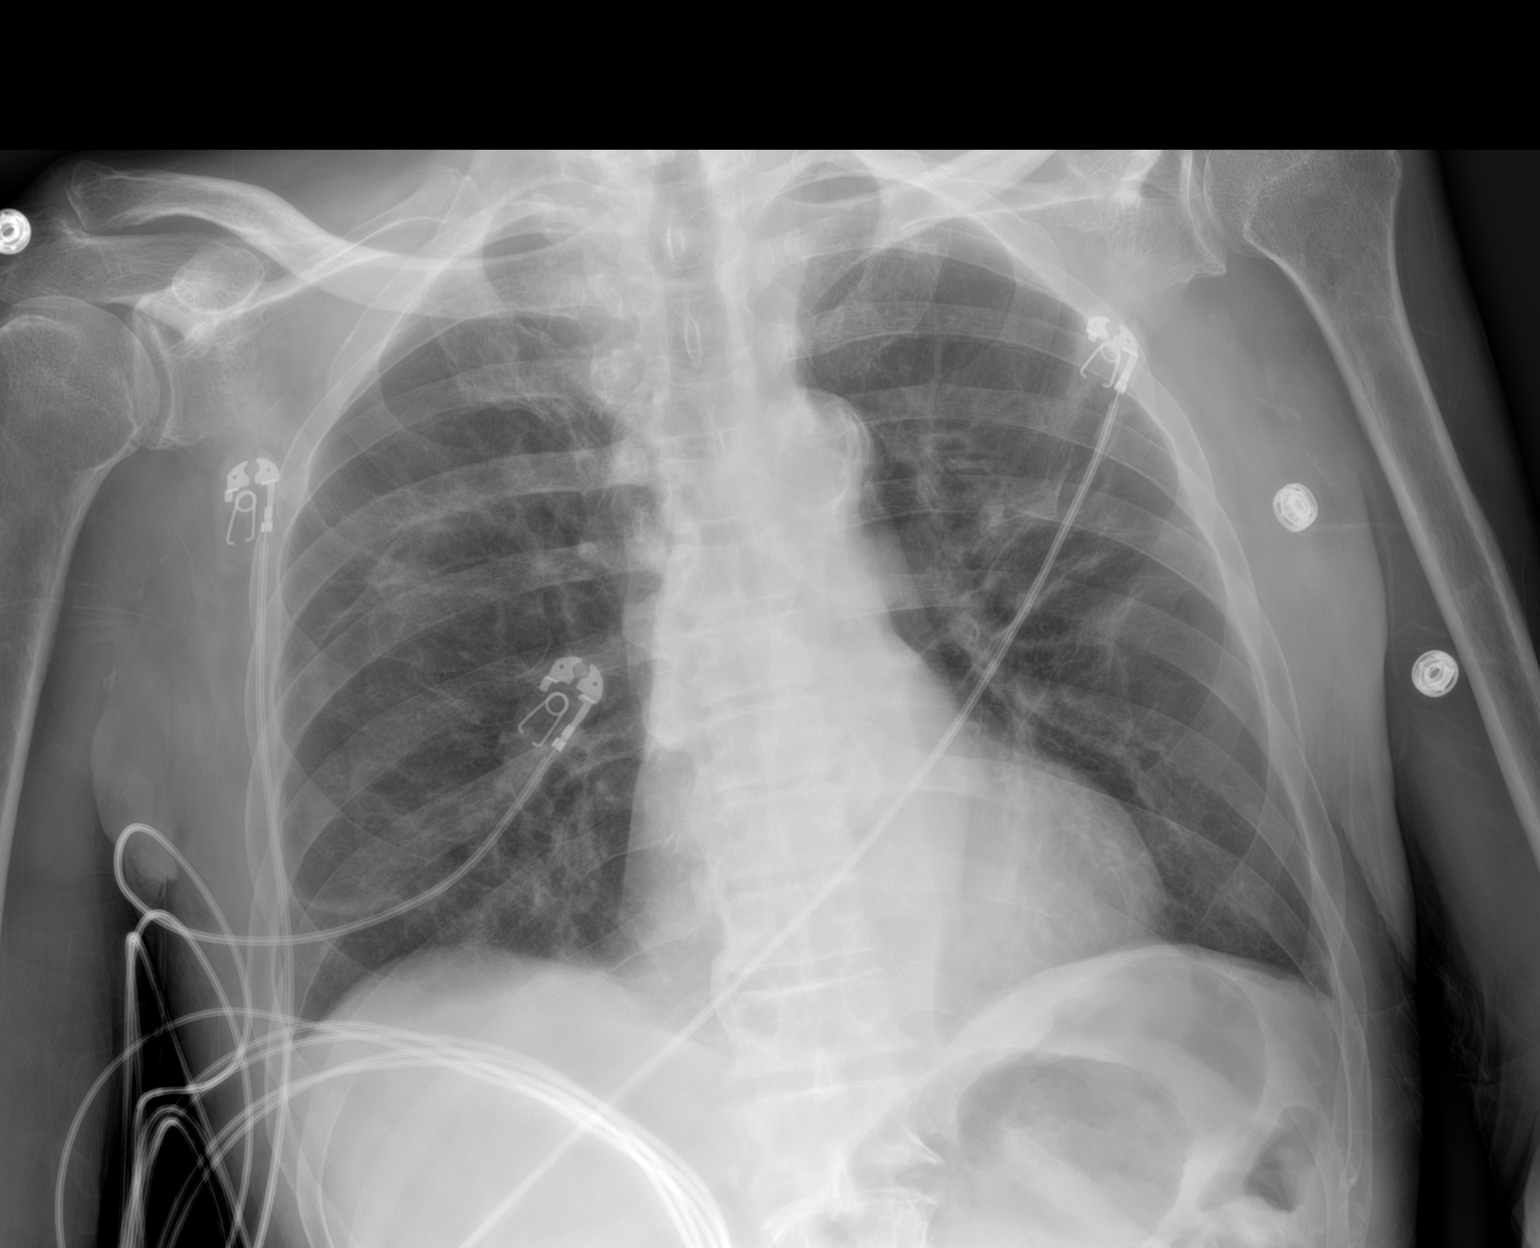

[1 of 1 positions shown; findings below may reference images not displayed]

FINDINGS: Negative for pneumonia. Negative for heart failure. Streaky lung
markings bilaterally unchanged most consistent with scarring.
IMPRESSION: No active disease.

## 2020-12-15 MED ORDER — SODIUM CHLORIDE 0.9 % IV SOLN: INTRAVENOUS | Status: AC

## 2020-12-15 NOTE — TOC Progression Note (Signed)
Transition of Care [facility]) - Progression Note    Patient Details  Name: Stanley Scott MRN: [id number] Date of Birth: 1951-02-10  Transition of Care [facility] CM/SW Contact  Baldemar Lenis, [place] Phone Number: 12/15/2020, 11:17 AM  Clinical Narrative:   CSW spoke with patient's son, Stanley Scott, to ask if he had the appointment with DSS yesterday about switching the patient's Medicaid from [place] to [place]. Stanley Scott seemed to confuse the palliative care meeting with a meeting with DSS about Medicaid, so CSW clarified that Stanley Scott will need to reach out to DSS about what they need in order to transfer the patient's Medicaid to [place], since he lives here now and will need services here. Stanley Scott indicated understanding, and said he would reach out to them today. CSW to follow.    Expected Discharge Plan: Skilled Nursing Facility Barriers to Discharge: Continued Medical Work up  Expected Discharge Plan and Services Expected Discharge Plan: Skilled Nursing Facility In-house Referral: Clinical Social Work Discharge Planning Services: NA Post Acute Care Choice: Skilled Nursing Facility Living arrangements for the past 2 months: Single Family Home                 DME Arranged: N/A DME Agency: NA       HH Arranged: NA HH Agency: NA         Social Determinants of Health (SDOH) Interventions    Readmission Risk Interventions No flowsheet data found.

## 2020-12-15 NOTE — Progress Notes (Signed)
Telemetry called to report to charge RN pt had 8 beats of wide QRS. Pt assessed to be asymptomatic, on-call MD paged and notified. No new orders received, pt sleeping in room with call light within reach, will continue to closely monitor. Dionne Bucy RN

## 2020-12-15 NOTE — Progress Notes (Signed)
Daily Progress Note   Patient Name: Stanley Scott       Date: 12/15/2020 DOB: [birth year]  Age: 70 y.o. MRN#: [id number] Attending Physician: Dessa Phi, DO Primary Care Physician: Neale Burly, MD Admit Date: 12/04/2020  Reason for Consultation/Follow-up:  To discuss complex medical decision making related to patient's goals of care  Subjective: Met at bedside with Stanley Scott and his son Stanley Scott.  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 Discussed code status.  Stanley Scott (Stanley Scott) said he would not want to be put on machines.  DNR established.   Went thru the MOST form with Stanley Scott.  He chose DNR, Limited interventions, Antibiotics if indicated, IVF if indicated, No feeding tube.   He asked the patient if he would ever want a feeding tube and Stanley Scott replied that he would not.  Discussed possible Hospice at SNF if Stanley Scott has both Medicaid and Medicare or if they reach the point that Stanley Scott is "long term care" at [facility].  Stanley Scott also asked about Hospice House in [place].  We discussed what it would look like when he was ready for [facility].  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 In the emergency room,  responded to IV fluid hydration. Stool tested positive for C. difficile. Started on oral vancomycin. Remains extremely debilitated.  12/30, with persistent quadriparesis, MRI of the cervical spine was done that shows severe cervical stenosis and spinal cord edema. Started on steroids. Discussed with neurosurgery and initiated transfer to [facility] for possible C-spine decompression.  He was evaluated by neurosurgery who felt he was a poor candidate for surgery.  They recommended a steroid taper and Palliative care.   Length of Stay: 10   Vital Signs: BP 107/66 (BP Location: Right Arm)   Pulse 85   Temp 97.7 F (36.5 C) (  Axillary)   Resp 16   Ht $R'5\' 5"'qm$  (1.651 m)   Wt 53.9 kg   SpO2 100%   BMI 19.77 kg/m  SpO2: SpO2: 100 % O2 Device: O2 Device: Room Air O2 Flow Rate: O2 Flow Rate (L/min): 2 L/min       Palliative Assessment/Data: 30%     Palliative Care Plan    Recommendations/Plan:  MOST form completed:  DNR, Limited interventions, No feeding tube.  Plan to discharge to SNF with Palliative to follow.   If he declines son will likely want Hospice.  Very supportive son, Stanley Scott who is his primary care-giver.  Code Status:  DNR  Prognosis:   < 6 months   Discharge Planning:  [place] for rehab with Palliative care service follow-up  Care plan was discussed with son.  Thank you for allowing the Palliative Medicine Team to assist in the care of this patient.  Total time spent:  45 min.     Greater than 50%  of this time was spent counseling and coordinating care related to the above assessment and plan.  Florentina Jenny, PA-C Palliative Medicine  Please contact Palliative MedicineTeam phone at [phone] for questions and concerns between 7 am - 7 pm.   Please see AMION for individual provider pager numbers.

## 2020-12-15 NOTE — Progress Notes (Signed)
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 In the emergency room, responded to IV fluid hydration. Stool tested positive for C. difficile. Started on oral vancomycin. Remains extremely debilitated.  On 12/29, patient was noted to be more weak on both sides. Stat MRI head is negative for acute stroke.  On 12/30, patient continued to have persistent quadriparesis. MRI of the cervical spine was done that shows severe cervical stenosis and spinal cord edema. Started on steroids. Discussed with neurosurgery and initiated transfer to [facility] for possible C-spine decompression. He was seen by neurosurgery on 1/3, but thought to be too high risk for surgery given the risk of perioperative morbidity and possible mortality. Plan for steroid taper over next 7 days, therapy evaluation as well as palliative care.  New events last 24 hours / Subjective: A bit confused today, continues to talk about a "white box" and pointing to the wall.  I am unable to ascertain what he was trying to convey this morning.  He was able to answer some questions appropriately, denied any chest pain or abdominal pain, denied nausea or vomiting.  He was also able to follow simple commands.  Assessment & Plan:   Principal Problem:   Acute diarrhea Active Problems:   Generalized weakness   Hypothermia   Failure to thrive in adult   Hypotension   Symptomatic anemia   Hypoalbuminemia   Prolonged QT interval    Gastric AVM   CVA (cerebral vascular accident) ([place])   SIRS (systemic inflammatory response syndrome) (HCC)   Hypokalemia   Essential hypertension   Pressure injury of skin   Protein-calorie malnutrition, severe   C. difficile diarrhea -Continue oral vancomycin 12/28-1/6 for total 10-day treatment  Severe cervical spine stenosis C3-C5, C-spine compression leading to physical debility, functional quadriplegia -Patient seen by neurosurgery, deemed to be too high risk for surgical procedure.  Recommended for steroid taper over the next week -PT OT recommending SNF placement -Palliative care consulted for goals of care discussion, planning for family meeting this afternoon  Sinus bradycardia -Continued to hold nodal agents.  No further cardiology work-up.  Follow-up with cardiology as needed.  Cardiology signed off 1/5  HTN -Metoprolol on hold due to bradycardia. Start amlodipine   History of stroke with residual right hemiparesis -Continue aspirin, statin  Severe protein calorie malnutrition -Dietitian consulted  Anemia of chronic disease -Status post 2 unit packed red blood cell 12/28 -Hemoglobin remains stable  Acute metabolic encephalopathy -Seems to be a bit more confused today, unclear etiology.  Continue to monitor.  Delirium precaution ordered    In agreement with assessment of the pressure ulcer as below:  Pressure Injury 12/06/20 Sacrum Medial Stage 2 -  Partial thickness loss of dermis presenting as a shallow open injury with a red, pink wound bed without slough. (Active)  12/06/20 1700  Location: Sacrum  Location Orientation: Medial  Staging: Stage 2 -  Partial thickness loss of dermis presenting as a shallow open injury with a red, pink wound bed without slough.  Wound Description (Comments):   Present on Admission: Yes     Nutrition Problem: Severe Malnutrition Etiology: chronic illness   DVT prophylaxis:  SCDs Start: 11/24/2020 1913  Code Status: Full  code Family Communication: No family at bedside Disposition Plan:  Status is: Inpatient  Remains inpatient appropriate because:Unsafe d/c plan   Dispo: The patient is from: Home              Anticipated d/c is to: SNF              Anticipated d/c date is: 1 day              Patient currently is medically stable to d/c.  Patient currently on p.o. vancomycin, steroid taper.  No surgical intervention planned per neurosurgery.  SNF placement pending.  Palliative care medicine meeting this afternoon.    Antimicrobials:  Anti-infectives (From admission, onward)   Start     Dose/Rate Route Frequency Ordered Stop   12/06/20 1830  vancomycin (VANCOCIN) 50 mg/mL oral solution 125 mg        125 mg Oral 4 times daily 12/06/20 1738 12/16/20 1759   12/01/2020 1300  cefTRIAXone (ROCEPHIN) 1 g in sodium chloride 0.9 % 100 mL IVPB  Status:  Discontinued        1 g 200 mL/hr over 30 Minutes Intravenous Every 24 hours 11/15/2020 1259 11/13/2020 2218       Objective: Vitals:   12/15/20 0822 12/15/20 0936 12/15/20 1022 12/15/20 1136  BP: (!) 148/90 112/76 110/70 108/68  Pulse: (!) 111 100 99 90  Resp: 13 16  16   Temp: (!) 97.4 F (36.3 C) 98 F (36.7 C) 97.6 F (36.4 C) (!) 97.5 F (36.4 C)  TempSrc: Rectal Axillary  Axillary  SpO2: 100% 100% 100% 100%  Weight:      Height:        Intake/Output Summary (Last 24 hours) at 12/15/2020 1157 Last data filed at 12/15/2020 1138 Gross per 24 hour  Intake 1126.89 ml  Output 550 ml  Net 576.89 ml   Filed Weights   12/10/20 0602 12/14/20 0500 12/15/20 0438  Weight: 57.7 kg 57.6 kg 53.9 kg    Examination: General exam: Appears calm and comfortable  Respiratory system: Clear to auscultation. Respiratory effort normal. Cardiovascular system: S1 & S2 heard, RRR. No pedal edema. Gastrointestinal system: Abdomen is nondistended, soft and nontender. Normal bowel sounds heard. Central nervous system: Alert, able to move all 4 extremities spontaneously,  although remains very weak.  Able to lift right arm against gravity, but was not able to squeeze my hand, seems to be somewhat confused today Extremities: Symmetric in appearance bilaterally    Data Reviewed: I have personally reviewed following labs and imaging studies  CBC: Recent Labs  Lab 12/10/20 0609 12/11/20 0747 12/12/20 0143 12/13/20 0613 12/14/20 0328  WBC 5.1 4.2 3.6* 7.1 3.8*  NEUTROABS 4.4 3.6 2.9 6.4 3.1  HGB 8.0* 9.2* 9.3* 9.5* 9.2*  HCT 27.0* 30.6* 30.0* 28.8* 27.9*  MCV 94.4 93.6 90.9 89.2 88.9  PLT 224 225 234 252 215   Basic Metabolic Panel: Recent Labs  Lab 12/10/20 0609 12/10/20 2022 12/11/20 0747 12/12/20 0143 12/13/20 0613 12/14/20 0328  NA 142 142 143 142 142 140  K 4.0 4.1 4.1 3.8 3.6 3.5  CL 113* 114* 112* 111 107 107  CO2 22 20* 19* 25 26 24   GLUCOSE 190* 287* 155* 230* 172* 230*  BUN 22 20 19 21 16  19  CREATININE 0.48* 0.59* 0.59* 0.55* 0.53* 0.68  CALCIUM 8.3* 8.3* 8.7* 8.7* 8.4* 8.3*  MG 1.8 1.8  --  1.8 1.7 1.7  PHOS 3.4  --   --  2.4* 3.6 2.8   GFR: Estimated Creatinine Clearance: 66.4 mL/min (by C-G formula based on SCr of 0.68 mg/dL). Liver Function Tests: Recent Labs  Lab 12/11/20 0747 12/12/20 0143 12/13/20 0613 12/14/20 0328  AST 34 46* 35 30  ALT 20 31 31  34  ALKPHOS 95 105 101 100  BILITOT 0.6 0.7 0.5 0.8  PROT 6.2* 5.7* 6.0* 5.5*  ALBUMIN 1.9* 1.9* 1.9* 1.8*   No results for input(s): LIPASE, AMYLASE in the last 168 hours. Recent Labs  Lab 12/12/20 1120  AMMONIA 17   Coagulation Profile: No results for input(s): INR, PROTIME in the last 168 hours. Cardiac Enzymes: No results for input(s): CKTOTAL, CKMB, CKMBINDEX, TROPONINI in the last 168 hours. BNP (last 3 results) No results for input(s): PROBNP in the last 8760 hours. HbA1C: No results for input(s): HGBA1C in the last 72 hours. CBG: Recent Labs  Lab 12/14/20 0602 12/14/20 1641 12/14/20 2124 12/15/20 0632 12/15/20 1143  GLUCAP 189* 178* 83 164*  140*   Lipid Profile: No results for input(s): CHOL, HDL, LDLCALC, TRIG, CHOLHDL, LDLDIRECT in the last 72 hours. Thyroid Function Tests: No results for input(s): TSH, T4TOTAL, FREET4, T3FREE, THYROIDAB in the last 72 hours. Anemia Panel: Recent Labs    12/13/20 0613  FOLATE 16.7  FERRITIN 724*  TIBC 186*  IRON 74   Sepsis Labs: No results for input(s): PROCALCITON, LATICACIDVEN in the last 168 hours.  Recent Results (from the past 240 hour(s))  Blood Culture (routine x 2)     Status: None   Collection Time: 2020-12-17 12:17 PM   Specimen: BLOOD  Result Value Ref Range Status   Specimen Description BLOOD BLOOD RIGHT FOREARM  Final   Special Requests   Final    BOTTLES DRAWN AEROBIC AND ANAEROBIC Blood Culture adequate volume   Culture   Final    NO GROWTH 5 DAYS Performed at [facility], [address], [place], [place]    Report Status 12/10/2020 FINAL  Final  Blood Culture (routine x 2)     Status: None   Collection Time: 12/17/20 12:45 PM   Specimen: BLOOD  Result Value Ref Range Status   Specimen Description BLOOD LEFT ANTECUBITAL  Final   Special Requests   Final    BOTTLES DRAWN AEROBIC AND ANAEROBIC Blood Culture results may not be optimal due to an inadequate volume of blood received in culture bottles   Culture   Final    NO GROWTH 5 DAYS Performed at [facility], [address], [place], [place]    Report Status 12/10/2020 FINAL  Final  Urine culture     Status: None   Collection Time: 17-Dec-2020 12:57 PM   Specimen: In/Out Cath Urine  Result Value Ref Range Status   Specimen Description   Final    IN/OUT CATH URINE Performed at [facility], [address] [place], [place]    Special Requests   Final    NONE Performed at [facility], [address] [place], [place]    Culture   Final    NO GROWTH Performed at [facility], [address], [place], [place]    Report Status 12/07/2020 FINAL  Final   Resp Panel by RT-PCR (Flu A&B, Covid) Nasopharyngeal Swab  Status: None   Collection Time: 2020/12/11  2:12 PM   Specimen: Nasopharyngeal Swab; Nasopharyngeal(NP) swabs in vial transport medium  Result Value Ref Range Status   SARS Coronavirus 2 by RT PCR NEGATIVE NEGATIVE Final    Comment: (NOTE) SARS-CoV-2 target nucleic acids are NOT DETECTED.  The SARS-CoV-2 RNA is generally detectable in upper respiratory specimens during the acute phase of infection. The lowest concentration of SARS-CoV-2 viral copies this assay can detect is 138 copies/mL. A negative result does not preclude SARS-Cov-2 infection and should not be used as the sole basis for treatment or other patient management decisions. A negative result may occur with  improper specimen collection/handling, submission of specimen other than nasopharyngeal swab, presence of viral mutation(s) within the areas targeted by this assay, and inadequate number of viral copies(<138 copies/mL). A negative result must be combined with clinical observations, patient history, and epidemiological information. The expected result is Negative.  Fact Sheet for Patients:  BloggerCourse.com  Fact Sheet for Healthcare Providers:  SeriousBroker.it  This test is no t yet approved or cleared by the [place] FDA and  has been authorized for detection and/or diagnosis of SARS-CoV-2 by FDA under an Emergency Use Authorization (EUA). This EUA will remain  in effect (meaning this test can be used) for the duration of the COVID-19 declaration under Section 564(b)(1) of the Act, 21 U.S.C.section 360bbb-3(b)(1), unless the authorization is terminated  or revoked sooner.       Influenza A by PCR NEGATIVE NEGATIVE Final   Influenza B by PCR NEGATIVE NEGATIVE Final    Comment: (NOTE) The Xpert Xpress SARS-CoV-2/FLU/RSV plus assay is intended as an aid in the diagnosis of influenza from  Nasopharyngeal swab specimens and should not be used as a sole basis for treatment. Nasal washings and aspirates are unacceptable for Xpert Xpress SARS-CoV-2/FLU/RSV testing.  Fact Sheet for Patients: BloggerCourse.com  Fact Sheet for Healthcare Providers: SeriousBroker.it  This test is not yet approved or cleared by the [place] FDA and has been authorized for detection and/or diagnosis of SARS-CoV-2 by FDA under an Emergency Use Authorization (EUA). This EUA will remain in effect (meaning this test can be used) for the duration of the COVID-19 declaration under Section 564(b)(1) of the Act, 21 U.S.C. section 360bbb-3(b)(1), unless the authorization is terminated or revoked.  Performed at [facility], [address] [place], [place] [address]   C Difficile Quick Screen w PCR reflex     Status: Abnormal   Collection Time: 12/06/20  4:00 PM   Specimen: STOOL  Result Value Ref Range Status   C Diff antigen POSITIVE (A) NEGATIVE Final   C Diff toxin POSITIVE (A) NEGATIVE Final    Comment: CRITICAL RESULT CALLED TO, READ BACK BY AND VERIFIED WITH: CARA LINGLER@1730  12/06/20 BY JONES,T    C Diff interpretation Toxin producing C. difficile detected.  Final    Comment: CRITICAL RESULT CALLED TO, READ BACK BY AND VERIFIED WITH: CARA LINGER @1730  12/06/20 BY JONES,T Performed at [facility], [address], [place], [place]   MRSA PCR Screening     Status: None   Collection Time: 12/11/20  8:30 PM   Specimen: Nasal Mucosa; Nasopharyngeal  Result Value Ref Range Status   MRSA by PCR NEGATIVE NEGATIVE Final    Comment:        The GeneXpert MRSA Assay (FDA approved for NASAL specimens only), is one component of a comprehensive MRSA colonization surveillance program. It is not intended to diagnose MRSA  infection nor to guide or monitor treatment for MRSA infections. Performed at [facility], [address], [place], [place] [address]       Radiology Studies: No results found.    Scheduled Meds: . amLODipine  5 mg Oral Daily  . aspirin EC  81 mg Oral Daily  . atorvastatin  40 mg Oral Daily  . Chlorhexidine Gluconate Cloth  6 each Topical Daily  . dexamethasone (DECADRON) injection  4 mg Intravenous Q6H   Followed by  . dexamethasone (DECADRON) injection  4 mg Intravenous Q12H   Followed by  . [START ON 12/17/2020] dexamethasone (DECADRON) injection  4 mg Intravenous Q24H   Followed by  . [START ON 12/19/2020] dexamethasone  2 mg Oral Daily   Followed by  . [START ON 12/21/2020] dexamethasone  1 mg Oral Daily  . feeding supplement  237 mL Oral BID BM  . folic acid  1 mg Oral Daily  . insulin aspart  0-9 Units Subcutaneous TID WC  . multivitamin with minerals  1 tablet Oral Daily  . nutrition supplement (JUVEN)  1 packet Oral BID BM  . pantoprazole  40 mg Oral Daily  . [START ON 12/21/2020] thiamine  100 mg Oral Daily  . vancomycin  125 mg Oral QID   Continuous Infusions: . [START ON 12/16/2020] thiamine injection       LOS: 10 days      Time spent: 25 minutes   Noralee Stain, DO [facility] 12/15/2020, 11:57 AM   Available via Epic secure chat 7am-7pm After these hours, please refer to coverage provider listed on [email or web address]

## 2020-12-16 DIAGNOSIS — R197 Diarrhea, unspecified: Secondary | ICD-10-CM | POA: Diagnosis not present

## 2020-12-16 LAB — CBC
HCT: 30.4 % — ABNORMAL LOW (ref 39.0–52.0)
Hemoglobin: 10.1 g/dL — ABNORMAL LOW (ref 13.0–17.0)
MCH: 29.4 pg (ref 26.0–34.0)
MCHC: 33.2 g/dL (ref 30.0–36.0)
MCV: 88.4 fL (ref 80.0–100.0)
Platelets: 200 10*3/uL (ref 150–400)
RBC: 3.44 MIL/uL — ABNORMAL LOW (ref 4.22–5.81)
RDW: 21.3 % — ABNORMAL HIGH (ref 11.5–15.5)
WBC: 7.3 10*3/uL (ref 4.0–10.5)
nRBC: 0 % (ref 0.0–0.2)

## 2020-12-16 LAB — GLUCOSE, CAPILLARY
Glucose-Capillary: 150 mg/dL — ABNORMAL HIGH (ref 70–99)
Glucose-Capillary: 150 mg/dL — ABNORMAL HIGH (ref 70–99)
Glucose-Capillary: 227 mg/dL — ABNORMAL HIGH (ref 70–99)

## 2020-12-16 LAB — MAGNESIUM: Magnesium: 2 mg/dL (ref 1.7–2.4)

## 2020-12-16 LAB — BASIC METABOLIC PANEL
Anion gap: 9 (ref 5–15)
BUN: 27 mg/dL — ABNORMAL HIGH (ref 8–23)
CO2: 22 mmol/L (ref 22–32)
Calcium: 8.7 mg/dL — ABNORMAL LOW (ref 8.9–10.3)
Chloride: 108 mmol/L (ref 98–111)
Creatinine, Ser: 0.65 mg/dL (ref 0.61–1.24)
GFR, Estimated: >60 mL/min (ref >60)
Glucose, Bld: 165 mg/dL — ABNORMAL HIGH (ref 70–99)
Potassium: 3.7 mmol/L (ref 3.5–5.1)
Sodium: 139 mmol/L (ref 135–145)

## 2020-12-16 NOTE — Progress Notes (Signed)
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 Pt found to have severe cervical spine stenosis on 12/30 MRI, transferred to [place].    PT Comments    Pt tolerates treatment well but continues to remain weak in all extremities and demonstrates impaired core control for balancing. Pt continues to require significant physical assistance to perform all functional mobility tasks and is unable to stand at this time due to BLE weakness. Pt will continue to benefit from aggressive mobilization and PT POC to improve sitting balance and progress to lateral scoot transfer training. PT continues to recommend SNF placement due to high falls risk.   Follow Up Recommendations  SNF;Supervision/Assistance - 24 hour     Equipment Recommendations   (mechanical lift)    Recommendations for Other Services       Precautions / Restrictions Precautions Precautions: Fall Restrictions Weight Bearing Restrictions: No    Mobility  Bed Mobility Overal bed mobility: Needs Assistance Bed Mobility: Rolling;Supine to Sit;Sit to Supine Rolling: Max assist   Supine to sit: Max assist;HOB elevated Sit to supine: Max assist      Transfers Overall transfer level: Needs assistance Equipment used: 1 person hand held assist Transfers: Sit to/from Stand           General transfer comment: unable to clear buttocks from bed  Ambulation/Gait                 Stairs              Wheelchair Mobility    Modified Rankin (Stroke Patients Only)       Balance Overall balance assessment: Needs assistance Sitting-balance support: Single extremity supported;Bilateral upper extremity supported;Feet supported Sitting balance-Leahy Scale: Poor Sitting balance - Comments: reliant on BUE support of bed Postural control: Posterior lean                                  Cognition Arousal/Alertness: Awake/alert Behavior During Therapy: WFL for tasks assessed/performed Overall Cognitive Status: No family/caregiver present to determine baseline cognitive functioning                                 General Comments: pt follows one step commands with increased time. He reports he was strong until he has a stroke a few weeks ago. Pt does seem to have some limitations in awareness and processing time      Exercises General Exercises - Lower Extremity Ankle Circles/Pumps:  (pt unable to follow commands to pump ankle) Long Arc Quad: AROM;Both;10 reps Heel Slides: AROM;Both;10 reps Hip ABduction/ADduction: AROM;Both;10 reps Hip Flexion/Marching: AROM;Both;10 reps    General Comments General comments (skin integrity, edema, etc.): pt reports some dizziness, BP stable in transition from supine to sitting      Pertinent Vitals/Pain Pain Assessment: Faces Faces Pain Scale: Hurts little more Pain Location: generalized Pain Descriptors / Indicators: Grimacing Pain Intervention(s): Monitored during session  Home Living                      Prior Function            PT Goals (current goals can now be found in the care plan section) Acute Rehab PT Goals Patient Stated Goal: to get stronger Progress towards PT goals: Not progressing toward goals - comment    Frequency    Min 2X/week      PT Plan Frequency needs to be updated    Co-evaluation              AM-PAC PT "6 Clicks" Mobility   Outcome  Measure  Help needed turning from your back to your side while in a flat bed without using bedrails?: A Lot Help needed moving from lying on your back to sitting on the side of a flat bed without using bedrails?: A Lot Help needed moving to and from a bed to a chair (including a wheelchair)?: Total Help needed standing up from a chair using your arms (e.g., wheelchair or bedside chair)?: Total Help needed to walk in hospital room?: Total Help needed climbing 3-5 steps with a railing? : Total 6 Click Score: 8    End of Session   Activity Tolerance: Patient tolerated treatment well Patient left: in bed;with call bell/phone within reach;with bed alarm set Nurse Communication: Mobility status;Need for lift equipment PT Visit Diagnosis: Unsteadiness on feet (R26.81);Other abnormalities of gait and mobility (R26.89);Muscle weakness (generalized) (M62.81)     Time: 3007-6226 PT Time Calculation (min) (ACUTE ONLY): 18 min  Charges:  $Therapeutic Exercise: 8-22 mins                     Arlyss Gandy, PT, DPT Acute Rehabilitation Pager: [phone]    Arlyss Gandy 12/16/2020, 3:12 PM

## 2020-12-16 NOTE — Progress Notes (Signed)
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 In the emergency room, responded to IV fluid hydration. Stool tested positive for C. difficile. Started on oral vancomycin. Remains extremely debilitated.  On 12/29, patient was noted to be more weak on both sides. Stat MRI head is negative for acute stroke.  On 12/30, patient continued to have persistent quadriparesis. MRI of the cervical spine was done that shows severe cervical stenosis and spinal cord edema. Started on steroids. Discussed with neurosurgery and initiated transfer to [facility] for possible C-spine decompression. He was seen by neurosurgery on 1/3, but thought to be too high risk for surgery given the risk of perioperative morbidity and possible mortality. Plan for steroid taper over next 7 days, therapy evaluation as well as palliative care.  New events last 24 hours / Subjective: Alert and oriented to self, "[place]", and states year 2021. States that his diarrhea has resolved. Seems less confused than yesterday.   Assessment & Plan:   Principal Problem:   Acute diarrhea Active Problems:   Generalized weakness   Hypothermia   Failure to thrive in adult   Hypotension   Symptomatic anemia   Hypoalbuminemia   Prolonged QT interval   Gastric AVM   CVA (cerebral vascular accident) (HCC)   SIRS (systemic inflammatory response syndrome) (HCC)   Hypokalemia   Essential hypertension   Pressure injury of skin    Protein-calorie malnutrition, severe   C. difficile diarrhea -Completed oral vancomycin 12/28-1/7 for total 10-day treatment  Severe cervical spine stenosis C3-C5, C-spine compression leading to physical debility, functional quadriplegia -Patient seen by neurosurgery, deemed to be too high risk for surgical procedure.  Recommended for steroid taper over the next week -PT OT recommending SNF placement -Palliative care consulted for goals of care discussion  Sinus bradycardia -Continued to hold nodal agents.  No further cardiology work-up.  Follow-up with cardiology as needed.  Cardiology signed off 1/5  HTN -Metoprolol on hold due to bradycardia. Start amlodipine   History of stroke with residual right hemiparesis -Continue aspirin, statin  Severe protein calorie malnutrition -Dietitian consulted  Anemia of chronic disease -Status post 2 unit packed red blood cell 12/28 -Hemoglobin remains stable  Acute metabolic encephalopathy -Seems his baseline is waxing and waning confusion at times. Today seems to be improved from exam yesterday. Delirium precaution and monitor.     In agreement with assessment of the pressure ulcer as below:  Pressure Injury 12/06/20 Sacrum Medial Stage 2 -  Partial thickness loss of dermis presenting as a shallow open injury with a red, pink wound bed without slough. (Active)  12/06/20 1700  Location: Sacrum  Location Orientation: Medial  Staging: Stage 2 -  Partial thickness loss of dermis presenting as a shallow open injury with a red, pink wound bed without slough.  Wound Description (Comments):   Present on Admission: Yes     Nutrition Problem: Severe Malnutrition Etiology: chronic illness   DVT prophylaxis:  SCDs Start: 11/21/2020 1913  Code Status: Full code Family Communication:  No family at bedside Disposition Plan:  Status is: Inpatient  Remains inpatient appropriate because:Unsafe d/c plan   Dispo: The patient is from: Home               Anticipated d/c is to: SNF              Anticipated d/c date is: 1 day              Patient currently is medically stable to d/c.  No surgical intervention planned per neurosurgery.  SNF placement pending.      Antimicrobials:  Anti-infectives (From admission, onward)   Start     Dose/Rate Route Frequency Ordered Stop   12/06/20 1830  vancomycin (VANCOCIN) 50 mg/mL oral solution 125 mg        125 mg Oral 4 times daily 12/06/20 1738 12/16/20 1759   12-30-20 1300  cefTRIAXone (ROCEPHIN) 1 g in sodium chloride 0.9 % 100 mL IVPB  Status:  Discontinued        1 g 200 mL/hr over 30 Minutes Intravenous Every 24 hours 12/30/20 1259 12/30/2020 2218       Objective: Vitals:   12/16/20 0133 12/16/20 0325 12/16/20 0518 12/16/20 0759  BP: 123/66   140/85  Pulse:  81  81  Resp: 18  14 16   Temp:  (!) 96.6 F (35.9 C)  97.8 F (36.6 C)  TempSrc:  Oral  Axillary  SpO2:  100%  100%  Weight:      Height:        Intake/Output Summary (Last 24 hours) at 12/16/2020 1007 Last data filed at 12/16/2020 0700 Gross per 24 hour  Intake 1129.87 ml  Output 1600 ml  Net -470.13 ml   Filed Weights   12/10/20 0602 12/14/20 0500 12/15/20 0438  Weight: 57.7 kg 57.6 kg 53.9 kg    Examination: General exam: Appears calm and comfortable  Respiratory system: Clear to auscultation. Respiratory effort normal. Cardiovascular system: S1 & S2 heard, RRR. No pedal edema. Gastrointestinal system: Abdomen is nondistended, soft and nontender. Normal bowel sounds heard. Central nervous system: Alert, able to move all 4 extremities spontaneously, right side appears to be weaker than left especially with right hand grip Extremities: Symmetric in appearance bilaterally  Skin: No rashes, lesions or ulcers on exposed skin    Data Reviewed: I have personally reviewed following labs and imaging studies  CBC: Recent Labs  Lab 12/10/20 0609 12/11/20 0747 12/12/20 0143 12/13/20 0613 12/14/20 0328  12/16/20 0143  WBC 5.1 4.2 3.6* 7.1 3.8* 7.3  NEUTROABS 4.4 3.6 2.9 6.4 3.1  --   HGB 8.0* 9.2* 9.3* 9.5* 9.2* 10.1*  HCT 27.0* 30.6* 30.0* 28.8* 27.9* 30.4*  MCV 94.4 93.6 90.9 89.2 88.9 88.4  PLT 224 225 234 252 215 200   Basic Metabolic Panel: Recent Labs  Lab 12/10/20 0609 12/10/20 2022 12/11/20 0747 12/12/20 0143 12/13/20 0613 12/14/20 0328 12/16/20 0143  NA 142 142 143 142 142 140 139  K 4.0 4.1 4.1 3.8 3.6 3.5 3.7  CL 113* 114* 112* 111 107 107 108  CO2 22 20* 19* 25 26 24 22   GLUCOSE 190* 287* 155* 230* 172* 230* 165*  BUN 22 20 19 21 16 19  27*  CREATININE 0.48* 0.59* 0.59* 0.55* 0.53* 0.68 0.65  CALCIUM 8.3* 8.3* 8.7* 8.7* 8.4* 8.3* 8.7*  MG 1.8 1.8  --  1.8 1.7 1.7 2.0  PHOS 3.4  --   --  2.4* 3.6 2.8  --  GFR: Estimated Creatinine Clearance: 66.4 mL/min (by C-G formula based on SCr of 0.65 mg/dL). Liver Function Tests: Recent Labs  Lab 12/11/20 0747 12/12/20 0143 12/13/20 0613 12/14/20 0328  AST 34 46* 35 30  ALT 20 31 31  34  ALKPHOS 95 105 101 100  BILITOT 0.6 0.7 0.5 0.8  PROT 6.2* 5.7* 6.0* 5.5*  ALBUMIN 1.9* 1.9* 1.9* 1.8*   No results for input(s): LIPASE, AMYLASE in the last 168 hours. Recent Labs  Lab 12/12/20 1120  AMMONIA 17   Coagulation Profile: No results for input(s): INR, PROTIME in the last 168 hours. Cardiac Enzymes: No results for input(s): CKTOTAL, CKMB, CKMBINDEX, TROPONINI in the last 168 hours. BNP (last 3 results) No results for input(s): PROBNP in the last 8760 hours. HbA1C: No results for input(s): HGBA1C in the last 72 hours. CBG: Recent Labs  Lab 12/15/20 0632 12/15/20 1143 12/15/20 1719 12/15/20 2105 12/16/20 0607  GLUCAP 164* 140* 162* 181* 150*   Lipid Profile: No results for input(s): CHOL, HDL, LDLCALC, TRIG, CHOLHDL, LDLDIRECT in the last 72 hours. Thyroid Function Tests: No results for input(s): TSH, T4TOTAL, FREET4, T3FREE, THYROIDAB in the last 72 hours. Anemia Panel: No results for input(s):  VITAMINB12, FOLATE, FERRITIN, TIBC, IRON, RETICCTPCT in the last 72 hours. Sepsis Labs: No results for input(s): PROCALCITON, LATICACIDVEN in the last 168 hours.  Recent Results (from the past 240 hour(s))  C Difficile Quick Screen w PCR reflex     Status: Abnormal   Collection Time: 12/06/20  4:00 PM   Specimen: STOOL  Result Value Ref Range Status   C Diff antigen POSITIVE (A) NEGATIVE Final   C Diff toxin POSITIVE (A) NEGATIVE Final    Comment: CRITICAL RESULT CALLED TO, READ BACK BY AND VERIFIED WITH: CARA LINGLER@1730  12/06/20 BY JONES,T    C Diff interpretation Toxin producing C. difficile detected.  Final    Comment: CRITICAL RESULT CALLED TO, READ BACK BY AND VERIFIED WITH: CARA LINGER @1730  12/06/20 BY JONES,T Performed at [facility], [address] [place], [address] [place]   MRSA PCR Screening     Status: None   Collection Time: 12/11/20  8:30 PM   Specimen: Nasal Mucosa; Nasopharyngeal  Result Value Ref Range Status   MRSA by PCR NEGATIVE NEGATIVE Final    Comment:        The GeneXpert MRSA Assay (FDA approved for NASAL specimens only), is one component of a comprehensive MRSA colonization surveillance program. It is not intended to diagnose MRSA infection nor to guide or monitor treatment for MRSA infections. Performed at [facility], [address], [place], [address] [place]       Radiology Studies: DG CHEST PORT 1 VIEW  Result Date: 12/15/2020 CLINICAL DATA:  Airway aspiration EXAM: PORTABLE CHEST 1 VIEW COMPARISON:  30-Dec-2020 FINDINGS: Negative for pneumonia. Negative for heart failure. Streaky lung markings bilaterally unchanged most consistent with scarring. IMPRESSION: No active disease. Electronically Signed   By: 02/12/2021 M.D.   On: 12/15/2020 13:38      Scheduled Meds: . aspirin EC  81 mg Oral Daily  . atorvastatin  40 mg Oral Daily  . Chlorhexidine Gluconate Cloth  6 each Topical Daily  . dexamethasone (DECADRON) injection  4  mg Intravenous Q12H   Followed by  . [START ON 12/17/2020] dexamethasone (DECADRON) injection  4 mg Intravenous Q24H   Followed by  . [START ON 12/19/2020] dexamethasone  2 mg Oral Daily   Followed by  . [  START ON 12/21/2020] dexamethasone  1 mg Oral Daily  . feeding supplement  237 mL Oral BID BM  . folic acid  1 mg Oral Daily  . insulin aspart  0-9 Units Subcutaneous TID WC  . multivitamin with minerals  1 tablet Oral Daily  . nutrition supplement (JUVEN)  1 packet Oral BID BM  . pantoprazole  40 mg Oral Daily  . [START ON 12/21/2020] thiamine  100 mg Oral Daily  . vancomycin  125 mg Oral QID   Continuous Infusions: . sodium chloride 75 mL/hr at 12/16/20 0754  . thiamine injection       LOS: 11 days      Time spent: 25 minutes   Dessa Phi, DO [facility] 12/16/2020, 10:07 AM   Available via Epic secure chat 7am-7pm After these hours, please refer to coverage provider listed on [email or web address]

## 2020-12-17 DIAGNOSIS — L89152 Pressure ulcer of sacral region, stage 2: Secondary | ICD-10-CM

## 2020-12-17 DIAGNOSIS — R636 Underweight: Secondary | ICD-10-CM | POA: Diagnosis present

## 2020-12-17 DIAGNOSIS — D649 Anemia, unspecified: Secondary | ICD-10-CM | POA: Diagnosis not present

## 2020-12-17 DIAGNOSIS — A0472 Enterocolitis due to Clostridium difficile, not specified as recurrent: Principal | ICD-10-CM

## 2020-12-17 DIAGNOSIS — M4802 Spinal stenosis, cervical region: Secondary | ICD-10-CM | POA: Diagnosis present

## 2020-12-17 DIAGNOSIS — R531 Weakness: Secondary | ICD-10-CM | POA: Diagnosis not present

## 2020-12-17 DIAGNOSIS — Z8673 Personal history of transient ischemic attack (TIA), and cerebral infarction without residual deficits: Secondary | ICD-10-CM

## 2020-12-17 DIAGNOSIS — R001 Bradycardia, unspecified: Secondary | ICD-10-CM | POA: Diagnosis present

## 2020-12-17 DIAGNOSIS — R739 Hyperglycemia, unspecified: Secondary | ICD-10-CM

## 2020-12-17 DIAGNOSIS — E43 Unspecified severe protein-calorie malnutrition: Secondary | ICD-10-CM

## 2020-12-17 DIAGNOSIS — R532 Functional quadriplegia: Secondary | ICD-10-CM | POA: Diagnosis present

## 2020-12-17 LAB — GLUCOSE, CAPILLARY
Glucose-Capillary: 95 mg/dL (ref 70–99)
Glucose-Capillary: 133 mg/dL — ABNORMAL HIGH (ref 70–99)
Glucose-Capillary: 154 mg/dL — ABNORMAL HIGH (ref 70–99)
Glucose-Capillary: 205 mg/dL — ABNORMAL HIGH (ref 70–99)
Glucose-Capillary: 217 mg/dL — ABNORMAL HIGH (ref 70–99)

## 2020-12-17 NOTE — Progress Notes (Signed)
Progress Note    Stanley Scott  ZWC:[id number] DOB: [birth year]  DOA: 2020/12/15 PCP: Toma Deiters, MD    Brief Narrative:   Chief complaint: Diarrhea  Medical records reviewed and are as summarized below:  Stanley Scott is an 70 y.o. 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 In the emergency room, responded to IV fluid hydration. Stool tested positive for C. Difficile, treated with oral vancomycin. Remains extremely debilitated.  On 12/07/20, patient was noted to be more weak on both sides. Stat MRI head was negative for acute stroke.  On 12/08/20, patient continued to have persistent quadriparesis. MRI of the cervical spine was done that showed severe cervical stenosis and spinal cord edema. Started on steroids. Discussed with neurosurgery and initiated transfer to [facility] for possible C-spine decompression. He was seen by neurosurgery on 12/12/20, but thought to be too high risk for surgery given the risk of perioperative morbidity and possible mortality. Plan for steroid taper over next 7 days, therapy evaluation as well as palliative care.  Assessment/Plan:   Principle problem: C. difficile colitis/diarrhea present on admission, associated with hypothermia and SIRS -Completed oral vancomycin 12/28-1/7 for total 10-day treatment. No further diarrhea reported.  Active problems: Severe cervical spine stenosis C3-C5, C-spine compression leading to physical debility, functional quadriplegia, FTT -Patient seen by neurosurgery, deemed to be too high risk for surgical procedure.  Recommended for steroid taper over the next week -PT OT recommending SNF placement -Palliative care team  following. Now DNR. MOST form completed.  Hyperglycemia -Likely steroid induced. Continue SSI.  Sinus bradycardia -Continued to hold nodal agents.  No further cardiology work-up.  Follow-up with cardiology as needed. -Cardiology signed off 12/14/20, no indications for PPM. -D/C tele.  HTN -Metoprolol on hold due to bradycardia. Continue amlodipine.   History of stroke with residual right hemiparesis -Continue aspirin, statin.  Anemia of chronic disease -Status post 2 unit packed red blood cell 12/06/20. -Hemoglobin remains stable.  Has h/o gastric AVMs.  Acute metabolic encephalopathy -Seems his baseline is waxing and waning confusion at times. Today seems to be improved from exam yesterday. Delirium precaution and monitor.   Stage 2 pressure ulcer to sacrum In agreement with assessment of the pressure ulcer as below:  Pressure Injury 12/06/20 Sacrum Medial Stage 2 -  Partial thickness loss of dermis presenting as a shallow open injury with a red, pink wound bed without slough. (Active)  12/06/20 1700  Location: Sacrum  Location Orientation: Medial  Staging: Stage 2 -  Partial thickness loss of dermis presenting as a shallow open injury with a red, pink wound bed without slough.  Wound Description (Comments):   Present on Admission: Yes   Severe protein calorie malnutrition/Underweight/hypoalbuminemia Agree with assessment by dietician noted below. Nutrition Problem: Severe Malnutrition Etiology: chronic illness Signs/Symptoms: moderate fat depletion,severe fat depletion,moderate muscle depletion,severe muscle depletion,percent weight loss Percent weight loss: 7.4 % (9 lbs x 6 weeks) Interventions: MVI,Magic cup,Ensure Enlive (each supplement provides 350kcal and 20 grams of protein)  Body mass index is 19.77 kg/m.   Family Communication/Anticipated D/C date and plan/Code Status   DVT prophylaxis: SCDs Start: 12-15-2020 1913   Code Status: DNR Family  Communication: No family at the bedside, son  not available by telephone. Disposition Plan: Status is: Inpatient  Remains inpatient appropriate because:Unsafe d/c plan, awaiting SNF.   Dispo: The patient is from: Home              Anticipated d/c is to: SNF              Anticipated d/c date is: 2 days              Patient currently is medically stable to d/c. Awaiting SNF bed.   Medical Consultants:    Neurosurgery  Cardiology  Palliative care   Anti-Infectives:    None  Subjective:   Last BM was this morning, formed.  Only had 1 stool yesterday, also formed.  Denies pain, SOB.    Objective:    Vitals:   12/16/20 1552 12/16/20 2110 12/16/20 2356 12/17/20 0342  BP: 122/71 133/72 137/85 (!) 150/82  Pulse: 97     Resp: 15 16 14 12   Temp: (!) 97.1 F (36.2 C) 97.8 F (36.6 C) 98.8 F (37.1 C) 97.6 F (36.4 C)  TempSrc: Axillary Oral Oral Oral  SpO2: 100% 98% 98% 100%  Weight:      Height:        Intake/Output Summary (Last 24 hours) at 12/17/2020 0748 Last data filed at 12/16/2020 1621 Gross per 24 hour  Intake --  Output 20 ml  Net -20 ml   Filed Weights   12/10/20 0602 12/14/20 0500 12/15/20 0438  Weight: 57.7 kg 57.6 kg 53.9 kg    Exam: General: No acute distress. Frail with loss of fat/muscle stores. Weak. Cardiovascular: Heart sounds show a regular rate, and rhythm. No gallops or rubs. No murmurs. No JVD. Lungs: Clear to auscultation bilaterally with good air movement. No rales, rhonchi or wheezes. Abdomen: Soft, nontender, nondistended with normal active bowel sounds. No masses. No hepatosplenomegaly. Neurological: Alert and oriented 3. Generalized weakness.  Cranial nerves II through XII grossly intact. Skin: Warm and dry. Sacral pressure sore. Extremities: No clubbing or cyanosis. No edema. Pedal pulses 2+. PAS hose on. Psychiatric: Mood and affect are normal. Insight and judgment are fair.   Data Reviewed:   I have personally reviewed  following labs and imaging studies:  Labs: Labs show the following:   Basic Metabolic Panel: Recent Labs  Lab 12/10/20 2022 12/11/20 0747 12/12/20 0143 12/13/20 0613 12/14/20 0328 12/16/20 0143  NA 142 143 142 142 140 139  K 4.1 4.1 3.8 3.6 3.5 3.7  CL 114* 112* 111 107 107 108  CO2 20* 19* 25 26 24 22   GLUCOSE 287* 155* 230* 172* 230* 165*  BUN 20 19 21 16 19  27*  CREATININE 0.59* 0.59* 0.55* 0.53* 0.68 0.65  CALCIUM 8.3* 8.7* 8.7* 8.4* 8.3* 8.7*  MG 1.8  --  1.8 1.7 1.7 2.0  PHOS  --   --  2.4* 3.6 2.8  --    GFR Estimated Creatinine Clearance: 66.4 mL/min (by C-G formula based on SCr of 0.65 mg/dL). Liver Function Tests: Recent Labs  Lab 12/11/20 0747 12/12/20 0143 12/13/20 0613 12/14/20 0328  AST 34 46* 35 30  ALT 20 31 31  34  ALKPHOS 95 105 101 100  BILITOT 0.6 0.7 0.5 0.8  PROT 6.2* 5.7* 6.0* 5.5*  ALBUMIN 1.9* 1.9* 1.9* 1.8*    Recent Labs  Lab 12/12/20 1120  AMMONIA 17    CBC: Recent Labs  Lab 12/11/20 0747 12/12/20 0143 12/13/20 0613 12/14/20 0328 12/16/20 0143  WBC 4.2 3.6* 7.1 3.8*  7.3  NEUTROABS 3.6 2.9 6.4 3.1  --   HGB 9.2* 9.3* 9.5* 9.2* 10.1*  HCT 30.6* 30.0* 28.8* 27.9* 30.4*  MCV 93.6 90.9 89.2 88.9 88.4  PLT 225 234 252 215 200   CBG: Recent Labs  Lab 12/15/20 2105 12/16/20 0607 12/16/20 1229 12/16/20 1600 12/17/20 0647  GLUCAP 181* 150* 150* 227* 95    Microbiology Recent Results (from the past 240 hour(s))  MRSA PCR Screening     Status: None   Collection Time: 12/11/20  8:30 PM   Specimen: Nasal Mucosa; Nasopharyngeal  Result Value Ref Range Status   MRSA by PCR NEGATIVE NEGATIVE Final    Comment:        The GeneXpert MRSA Assay (FDA approved for NASAL specimens only), is one component of a comprehensive MRSA colonization surveillance program. It is not intended to diagnose MRSA infection nor to guide or monitor treatment for MRSA infections. Performed at [facility], [address]  [place], [place] [address]     Procedures and diagnostic studies:  DG CHEST PORT 1 VIEW  Result Date: 12/15/2020 CLINICAL DATA:  Airway aspiration EXAM: PORTABLE CHEST 1 VIEW COMPARISON:  12/03/2020 FINDINGS: Negative for pneumonia. Negative for heart failure. Streaky lung markings bilaterally unchanged most consistent with scarring. IMPRESSION: No active disease. Electronically Signed   By: Marlan Palau M.D.   On: 12/15/2020 13:38    Medications:   . aspirin EC  81 mg Oral Daily  . atorvastatin  40 mg Oral Daily  . Chlorhexidine Gluconate Cloth  6 each Topical Daily  . dexamethasone (DECADRON) injection  4 mg Intravenous Q12H   Followed by  . dexamethasone (DECADRON) injection  4 mg Intravenous Q24H   Followed by  . [START ON 12/19/2020] dexamethasone  2 mg Oral Daily   Followed by  . [START ON 12/21/2020] dexamethasone  1 mg Oral Daily  . feeding supplement  237 mL Oral BID BM  . folic acid  1 mg Oral Daily  . insulin aspart  0-9 Units Subcutaneous TID WC  . multivitamin with minerals  1 tablet Oral Daily  . nutrition supplement (JUVEN)  1 packet Oral BID BM  . pantoprazole  40 mg Oral Daily  . [START ON 12/21/2020] thiamine  100 mg Oral Daily   Continuous Infusions: . thiamine injection Stopped (12/16/20 1950)     LOS: 12 days   Askia Hazelip  [facility]   [facility] How to contact the [facility] Attending or Consulting provider 7A - 7P or covering provider during after hours 7P -7A, for this patient?  1. Check the care team in [facility] and look for a) attending/consulting [facility] provider listed and b) the [facility] team listed 2. Log into [email or web address] and use [facility] universal password to access. If you do not have the password, please contact the hospital operator. 3. Locate the [facility] provider you are looking for under [facility] and page to a number that you can be directly reached. 4. If you still have difficulty reaching the provider, please page the [facility]  (Director on Call) for the Hospitalists listed on amion for assistance.  12/17/2020, 7:48 AM

## 2020-12-18 DIAGNOSIS — M4802 Spinal stenosis, cervical region: Secondary | ICD-10-CM | POA: Diagnosis not present

## 2020-12-18 DIAGNOSIS — R531 Weakness: Secondary | ICD-10-CM | POA: Diagnosis not present

## 2020-12-18 DIAGNOSIS — A0472 Enterocolitis due to Clostridium difficile, not specified as recurrent: Secondary | ICD-10-CM | POA: Diagnosis not present

## 2020-12-18 DIAGNOSIS — D649 Anemia, unspecified: Secondary | ICD-10-CM | POA: Diagnosis not present

## 2020-12-18 LAB — GLUCOSE, CAPILLARY
Glucose-Capillary: 188 mg/dL — ABNORMAL HIGH (ref 70–99)
Glucose-Capillary: 201 mg/dL — ABNORMAL HIGH (ref 70–99)
Glucose-Capillary: 214 mg/dL — ABNORMAL HIGH (ref 70–99)
Glucose-Capillary: 140 mg/dL — ABNORMAL HIGH (ref 70–99)

## 2020-12-18 NOTE — Progress Notes (Signed)
Progress Note    Stanley Scott  NAT:[id number] DOB: 20-Apr-1951  DOA: 2020/12/15 PCP: Toma Deiters, MD    Brief Narrative:   Chief complaint: Diarrhea  Medical records reviewed and are as summarized below:  Stanley Scott is an 70 y.o. 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 In the emergency room, responded to IV fluid hydration. Stool tested positive for C. Difficile, treated with oral vancomycin. Remains extremely debilitated.  On 12/07/20, patient was noted to be more weak on both sides. Stat MRI head was negative for acute stroke.  On 12/08/20, patient continued to have persistent quadriparesis. MRI of the cervical spine was done that showed severe cervical stenosis and spinal cord edema. Started on steroids. Discussed with neurosurgery and initiated transfer to [facility] for possible C-spine decompression. He was seen by neurosurgery on 12/12/20, but thought to be too high risk for surgery given the risk of perioperative morbidity and possible mortality. Plan for steroid taper over next 7 days, therapy evaluation as well as palliative care.  Assessment/Plan:   Principle problem: C. difficile colitis/diarrhea present on admission, associated with hypothermia and SIRS -Completed oral vancomycin 12/28-1/7 for total 10-day treatment. No further diarrhea reported.  Active problems: Severe cervical spine stenosis C3-C5, C-spine compression leading to physical debility, functional quadriplegia, FTT -Patient seen by neurosurgery, deemed to be too high risk for surgical procedure.  Recommended for steroid taper over the next week -PT OT recommending SNF placement -Palliative care team  following. Now DNR. MOST form completed.  Hyperglycemia -Likely steroid induced. Continue SSI.  Sinus bradycardia -Continued to hold nodal agents.  No further cardiology work-up.  Follow-up with cardiology as needed. -Cardiology signed off 12/14/20, no indications for PPM. -Tele d/c'd 12/17/20.  HTN -Metoprolol on hold due to bradycardia. Continue amlodipine.   History of stroke with residual right hemiparesis -Continue aspirin, statin.  Anemia of chronic disease -Status post 2 unit packed red blood cell 12/06/20. -Hemoglobin remains stable.  Has h/o gastric AVMs.  Acute metabolic encephalopathy -Seems his baseline is waxing and waning confusion at times.  -Continue delirium precautions and monitor.   Stage 2 pressure ulcer to sacrum -RN to reduce sacral pressure with frequent position changes. -In agreement with assessment of the pressure ulcer as below:  Pressure Injury 12/06/20 Sacrum Medial Stage 2 -  Partial thickness loss of dermis presenting as a shallow open injury with a red, pink wound bed without slough. (Active)  12/06/20 1700  Location: Sacrum  Location Orientation: Medial  Staging: Stage 2 -  Partial thickness loss of dermis presenting as a shallow open injury with a red, pink wound bed without slough.  Wound Description (Comments):   Present on Admission: Yes   Severe protein calorie malnutrition/Underweight/hypoalbuminemia Agree with assessment by dietician noted below. Nutrition Problem: Severe Malnutrition Etiology: chronic illness Signs/Symptoms: moderate fat depletion,severe fat depletion,moderate muscle depletion,severe muscle depletion,percent weight loss Percent weight loss: 7.4 % (9 lbs x 6 weeks) Interventions: MVI,Magic cup,Ensure Enlive (each supplement provides 350kcal and 20 grams of protein)  Body mass index is 19.77 kg/m.   Family Communication/Anticipated D/C date and plan/Code Status   DVT prophylaxis: SCDs Start: Dec 15, 2020  1913   Code Status: DNR Family Communication: No family  at the bedside, son not available by telephone. Disposition Plan: Status is: Inpatient  Remains inpatient appropriate because:Unsafe d/c plan, awaiting SNF.   Dispo: The patient is from: Home              Anticipated d/c is to: SNF              Anticipated d/c date is: 1 day              Patient currently is medically stable to d/c. Awaiting SNF bed.   Medical Consultants:    Neurosurgery  Cardiology  Palliative care   Anti-Infectives:    None  Subjective:   Examined with the RN at bedside. No pain in sacral area despite stage II ulcer. Denies pain/discomfort elsewhere, SOB and N/V.   Objective:    Vitals:   12/17/20 1612 12/17/20 2005 12/18/20 0019 12/18/20 0423  BP: 106/68 112/69 128/72 (!) 157/87  Pulse: 87 85 70 73  Resp: 16 18 20 16   Temp: 98.3 F (36.8 C) 98.3 F (36.8 C) 98.2 F (36.8 C) (!) 97.4 F (36.3 C)  TempSrc: Oral Axillary Oral Oral  SpO2: 98% 100% 100% 100%  Weight:      Height:        Intake/Output Summary (Last 24 hours) at 12/18/2020 0724 Last data filed at 12/18/2020 0500 Gross per 24 hour  Intake 500 ml  Output 2700 ml  Net -2200 ml   Filed Weights   12/10/20 0602 12/14/20 0500 12/15/20 0438  Weight: 57.7 kg 57.6 kg 53.9 kg    Exam: General: No acute distress. Remains frail and weak with loss of fat/muscle stores. Cardiovascular: Heart sounds show a regular rate, and rhythm. No gallops or rubs. No murmurs. No JVD. Lungs: Clear to auscultation bilaterally with good air movement. No rales, rhonchi or wheezes. Abdomen: Soft, nontender, nondistended with normal active bowel sounds. No masses. No hepatosplenomegaly. Neurological: Alert and oriented 3. Moves all extremities 4 with diminished strength. Cranial nerves II through XII grossly intact. Skin: Warm and dry. No rashes or lesions.Sacral pressure sore examined. Large area with partial skin loss on periphery.  Appears to  be at high risk of further skin break down. Extremities: No clubbing or cyanosis. No edema. Pedal pulses 2+. PAS hose on. Psychiatric: Mood and affect are normal. Insight and judgment are fair.   Data Reviewed:   I have personally reviewed following labs and imaging studies:  Labs: Labs show the following:   Basic Metabolic Panel: Recent Labs  Lab 12/11/20 0747 12/12/20 0143 12/13/20 0613 12/14/20 0328 12/16/20 0143  NA 143 142 142 140 139  K 4.1 3.8 3.6 3.5 3.7  CL 112* 111 107 107 108  CO2 19* 25 26 24 22   GLUCOSE 155* 230* 172* 230* 165*  BUN 19 21 16 19  27*  CREATININE 0.59* 0.55* 0.53* 0.68 0.65  CALCIUM 8.7* 8.7* 8.4* 8.3* 8.7*  MG  --  1.8 1.7 1.7 2.0  PHOS  --  2.4* 3.6 2.8  --    GFR Estimated Creatinine Clearance: 66.4 mL/min (by C-G formula based on SCr of 0.65 mg/dL). Liver Function Tests: Recent Labs  Lab 12/11/20 0747 12/12/20 0143 12/13/20 0613 12/14/20 0328  AST 34 46* 35 30  ALT 20 31 31  34  ALKPHOS 95 105 101 100  BILITOT 0.6 0.7 0.5 0.8  PROT 6.2* 5.7* 6.0* 5.5*  ALBUMIN 1.9* 1.9* 1.9* 1.8*    Recent Labs  Lab 12/12/20 1120  AMMONIA 17  CBC: Recent Labs  Lab 12/11/20 0747 12/12/20 0143 12/13/20 0613 12/14/20 0328 12/16/20 0143  WBC 4.2 3.6* 7.1 3.8* 7.3  NEUTROABS 3.6 2.9 6.4 3.1  --   HGB 9.2* 9.3* 9.5* 9.2* 10.1*  HCT 30.6* 30.0* 28.8* 27.9* 30.4*  MCV 93.6 90.9 89.2 88.9 88.4  PLT 225 234 252 215 200   CBG: Recent Labs  Lab 12/17/20 0813 12/17/20 1234 12/17/20 1612 12/17/20 2149 12/18/20 0629  GLUCAP 133* 154* 205* 217* 188*    Microbiology Recent Results (from the past 240 hour(s))  MRSA PCR Screening     Status: None   Collection Time: 12/11/20  8:30 PM   Specimen: Nasal Mucosa; Nasopharyngeal  Result Value Ref Range Status   MRSA by PCR NEGATIVE NEGATIVE Final    Comment:        The GeneXpert MRSA Assay (FDA approved for NASAL specimens only), is one component of a comprehensive MRSA  colonization surveillance program. It is not intended to diagnose MRSA infection nor to guide or monitor treatment for MRSA infections. Performed at [facility], [address], [place], [place] [address]     Procedures and diagnostic studies:  No results found.  Medications:   . aspirin EC  81 mg Oral Daily  . atorvastatin  40 mg Oral Daily  . Chlorhexidine Gluconate Cloth  6 each Topical Daily  . dexamethasone (DECADRON) injection  4 mg Intravenous Q24H   Followed by  . [START ON 12/19/2020] dexamethasone  2 mg Oral Daily   Followed by  . [START ON 12/21/2020] dexamethasone  1 mg Oral Daily  . feeding supplement  237 mL Oral BID BM  . folic acid  1 mg Oral Daily  . insulin aspart  0-9 Units Subcutaneous TID WC  . multivitamin with minerals  1 tablet Oral Daily  . nutrition supplement (JUVEN)  1 packet Oral BID BM  . pantoprazole  40 mg Oral Daily  . [START ON 12/21/2020] thiamine  100 mg Oral Daily   Continuous Infusions: . thiamine injection Stopped (12/17/20 1121)     LOS: 13 days   Lashea Goda  [facility]   [facility] How to contact the [facility] Attending or Consulting provider 7A - 7P or covering provider during after hours 7P -7A, for this patient?  1. Check the care team in [facility] and look for a) attending/consulting TR[facility] provider listed and b) the [facility] team listed 2. Log into [email or web address] and use [place]'s universal password to access. If you do not have the password, please contact the hospital operator. 3. Locate the [facility] provider you are looking for under [facility] and page to a number that you can be directly reached. 4. If you still have difficulty reaching the provider, please page the [facility] (Director on Call) for the Hospitalists listed on amion for assistance.  12/18/2020, 7:24 AM

## 2020-12-19 DIAGNOSIS — A0472 Enterocolitis due to Clostridium difficile, not specified as recurrent: Secondary | ICD-10-CM | POA: Diagnosis not present

## 2020-12-19 LAB — GLUCOSE, CAPILLARY
Glucose-Capillary: 117 mg/dL — ABNORMAL HIGH (ref 70–99)
Glucose-Capillary: 76 mg/dL (ref 70–99)
Glucose-Capillary: 105 mg/dL — ABNORMAL HIGH (ref 70–99)
Glucose-Capillary: 106 mg/dL — ABNORMAL HIGH (ref 70–99)

## 2020-12-19 LAB — BASIC METABOLIC PANEL
Anion gap: 7 (ref 5–15)
BUN: 18 mg/dL (ref 8–23)
CO2: 27 mmol/L (ref 22–32)
Calcium: 8.6 mg/dL — ABNORMAL LOW (ref 8.9–10.3)
Chloride: 104 mmol/L (ref 98–111)
Creatinine, Ser: 0.51 mg/dL — ABNORMAL LOW (ref 0.61–1.24)
GFR, Estimated: >60 mL/min (ref >60)
Glucose, Bld: 110 mg/dL — ABNORMAL HIGH (ref 70–99)
Potassium: 4 mmol/L (ref 3.5–5.1)
Sodium: 138 mmol/L (ref 135–145)

## 2020-12-19 MED ORDER — SODIUM CHLORIDE 0.9 % IV BOLUS
500.0000 mL | Freq: Once | INTRAVENOUS | Status: AC
  Administered 2020-12-20: 500 mL via INTRAVENOUS

## 2020-12-19 MED ORDER — COLLAGENASE 250 UNIT/GM EX OINT
TOPICAL_OINTMENT | Freq: Every day | CUTANEOUS | Status: DC
  Administered 2020-12-24 – 2020-12-25 (×2): 1 via TOPICAL
  Filled 2020-12-19 (×4): qty 30

## 2020-12-19 MED ORDER — NYSTATIN 100000 UNIT/ML MT SUSP
5.0000 mL | Freq: Four times a day (QID) | OROMUCOSAL | Status: DC
  Administered 2020-12-19 – 2020-12-24 (×20): 500000 [IU] via ORAL
  Filled 2020-12-19 (×18): qty 5

## 2020-12-19 NOTE — Consult Note (Signed)
WOC Nurse Consult Note: Patient receiving care in [facility] [phone]. Reason for Consult: sacral wound Wound type: unstageable PI Pressure Injury POA: No Measurement: 4.2 cm x 6.5 cm x unknown depth Wound bed: 100% brown and yellow slough (dry) Drainage (amount, consistency, odor) tan on existing foam dressing Periwound: intact Dressing procedure/placement/frequency:  Apply Santyl to sacral wound in a nickel thick layer. Cover with a saline moistened gauze, then dry gauze or ABD pad.  Change daily. I am also ordering a standard size bed with low air loss mattress. Monitor the wound area(s) for worsening of condition such as: Signs/symptoms of infection,  Increase in size,  Development of or worsening of odor, Development of pain, or increased pain at the affected locations.  Notify the medical team if any of these develop.  Thank you for the consult.  Discussed plan of care with the patient.  WOC nurse will not follow at this time.  Please re-consult the WOC team if needed.  Helmut Muster, RN, MSN, CWOCN, CNS-BC, pager [phone]

## 2020-12-19 NOTE — Progress Notes (Signed)
Initial Nutrition Assessment  DOCUMENTATION CODES:   Severe malnutrition in context of chronic illness  INTERVENTION:   Continue Ensure Enlive po BID, each supplement provides 350 kcal and 20 grams of protein  Continue Magic cup BID with meals, each supplement provides 290 kcal and 9 grams of protein  Continue Juven BID  Continue MVI with minerals daily   NUTRITION DIAGNOSIS:   Severe Malnutrition related to chronic illness as evidenced by moderate fat depletion,severe fat depletion,moderate muscle depletion,severe muscle depletion,percent weight loss.  ongoing  GOAL:   Patient will meet greater than or equal to 90% of their needs  progressing  MONITOR:   PO intake,Weight trends,Labs,I & O's,Supplement acceptance,Skin  REASON FOR ASSESSMENT:   Consult    ASSESSMENT:   70 year old male admitted with acute diarrhea presented with multiple loose stools, electrolyte abnormalities, hypothermic, and low blood pressures. 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  Pt was seen by Neurology due to severe cervical spine stenosis, C-spine compression leading to physical debility, functional quadriplegia, FTT. Neuro determined pt was too high risk for surgical procedure. Pt now pending discharge to SNF. Anticipated d/c today.  Pt reports diarrhea has subsided. Pt also denies N/V. Pt's appetite/intake have remained adequate since last RD assessment and pt continues to do well with supplements per RN (Ensure BID, Magic Cup BID, Juven BID)  PO Intake: 10-100% x last 8 recorded meals (64% average meal intake).  UOP: x24 hours  Labs: CBGs 117-140 Medications: decadron, folvite, ss novolog TID w/ meals, mvi with minerals, juven BID, thiamine  Diet Order:   Diet Order            DIET  DYS 2 Room service appropriate? Yes; Fluid consistency: Thin  Diet effective now                 EDUCATION NEEDS:   Education needs have been addressed  Skin:  Skin Assessment: Skin Integrity Issues: Skin Integrity Issues:: Stage II,Other (Comment) Stage II: sacrum Other: MASD left;right buttocks  Last BM:  12/11/20  Height:   Ht Readings from Last 1 Encounters:  12/07/20 5\' 5"  (1.651 m)    Weight:   Wt Readings from Last 1 Encounters:  12/19/20 54.1 kg    BMI:  Body mass index is 19.85 kg/m.  Estimated Nutritional Needs:   Kcal:  1800-2000  Protein:  100-115 grams  Fluid:  > 1.8 L    02/16/21, MS, RD, LDN RD pager number and weekend/on-call pager number located in [place].

## 2020-12-19 NOTE — Progress Notes (Signed)
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  In the emergency room, responded to IV fluid hydration.  Stool tested positive for C. difficile, treated with oral vancomycin.  Remain extremely debilitated.  On 12/07/2020 patient was noted to be more weak on both sides.  Stat MRI head was negative for acute stroke.  On 12/08/2020, patient continued to have persistent quadriparesis.  MRI of the cervical spine was thought that showed severe cervical stenosis and spinal cord edema.  Started on a steroids.  Discussed with neurosurgery and initiated transfer to [facility] for possible C-spine decompression.  He was seen by neurosurgery on 12/12/20, but thought to be too high risk for surgery given the risks of perioperative morbidity and possible mortality.  Plan for a steroid taper over the next 7 days, therapy evaluation as well as palliative care.     Assessment & Plan:   Principal Problem:   C. difficile diarrhea Active Problems:   Generalized weakness   Hypothermia   Failure to thrive in adult   Hypotension   Symptomatic anemia   Hypoalbuminemia   Prolonged QT interval   Gastric AVM   SIRS (systemic inflammatory response syndrome) (HCC)   Hypokalemia   Essential hypertension   Pressure injury of skin   Protein-calorie malnutrition, severe   Cervical spinal stenosis   Functional quadriplegia (HCC)   Hyperglycemia   Sinus bradycardia   H/O: CVA (cerebrovascular accident)   Underweight   1-C difficile colitis/diarrhea, present on admission  associated with hypothermia and SIRS -Patient completed course of oral vancomycin from 12/28 until 1/7 for total of 10-day treatment.  No further diarrhea reported.  2-Severe cervical spinal stenosis C 3-C 5 , C-spine compression leading to physical debility, functional quadriplegia , FTT; -Patient seen by neurosurgery, team to be too high risk for surgical procedure.  Recommended for a steroid taper over the next several days. -PT OT recommending SNF placement -Palliative care team following, now DNR, MOST form completed.  3-Hyperglycemia: Likely from steroid-induced.  Continue with sliding scale insulin  4-Sinus bradycardia: Continue to hold nodal agents. Cardiology signed off on 12/14/2020 no indication for pacemaker  5-History of stroke with residual right hemiparesis Continue with aspirin and statins  6-Anemia of chronic disease: Status post 2 units of packed red blood cells on 12/06/2020 Hemoglobin remained stable.  He has a history of gastric AVM  7-Acute metabolic encephalopathy: Mental status waxing and waning Continue delirium precaution  8-Stage 2 pressure ulcer to sacrum. Agree with documentation from wound care nurse  Pressure Injury 12/06/20 Sacrum Medial Stage 2 -  Partial thickness loss of dermis presenting as a shallow open injury with a red, pink wound bed without slough. (Active)  12/06/20 1700  Location: Sacrum  Location Orientation: Medial  Staging: Stage 2 -  Partial thickness loss of dermis presenting as a shallow open injury with a red, pink wound bed without slough.  Wound Description (Comments):   Present on Admission: Yes   Severe protein caloric malnutrition/underweight/ Continue with supplementation.   Nutrition Problem: Severe Malnutrition Etiology: chronic illness    Signs/Symptoms: moderate fat depletion,severe fat  depletion,moderate muscle depletion,severe muscle depletion,percent weight loss Percent weight loss: 7.4 % (9 lbs x 6  weeks)    Interventions: MVI,Magic cup,Ensure Enlive (each supplement provides 350kcal and 20 grams of protein)  Estimated body mass index is 19.85 kg/m as calculated from the following:   Height as of this encounter: 5\' 5"  (1.651 m).   Weight as of this encounter: 54.1 kg.   DVT prophylaxis: SCD Code Status: DNR Family Communication:No family at bedside.  Disposition Plan:  Status is: Inpatient  Remains inpatient appropriate because:Unsafe d/c plan   Dispo: The patient is from: Home              Anticipated d/c is to: SNF              Anticipated d/c date is: 1 day              Patient currently is medically stable to d/c.  Awaiting SNF        Consultants:   Neurosurgery   Cardiology  Palliative care  Procedures:     Antimicrobials:    Subjective: Alert, denies chest pain   Objective: Vitals:   12/18/20 2038 12/18/20 2354 12/19/20 0334 12/19/20 0341  BP: 129/69 (!) 145/75 140/81   Pulse: 77 86 93   Resp: 16 16 18    Temp: 97.8 F (36.6 C) (!) 97.5 F (36.4 C) 97.7 F (36.5 C)   TempSrc: Oral Oral Oral   SpO2: 100% 100% 100%   Weight:    54.1 kg  Height:        Intake/Output Summary (Last 24 hours) at 12/19/2020 0949 Last data filed at 12/19/2020 0000 Gross per 24 hour  Intake -  Output 1550 ml  Net -1550 ml   Filed Weights   12/14/20 0500 12/15/20 0438 12/19/20 0341  Weight: 57.6 kg 53.9 kg 54.1 kg    Examination:  General exam: frail. cachetic Respiratory system: Clear to auscultation. Respiratory effort normal. Cardiovascular system: S1 & S2 heard, RRR. No JVD, murmurs, rubs, gallops or clicks. No pedal edema. Gastrointestinal system: Abdomen is nondistended, soft and nontender. No organomegaly or masses felt. Normal bowel sounds heard. Central nervous system: Alert ,. Extremities: no edema  Data Reviewed: I have personally reviewed following labs and imaging studies  CBC: Recent Labs  Lab 12/13/20 0613 12/14/20 0328  12/16/20 0143  WBC 7.1 3.8* 7.3  NEUTROABS 6.4 3.1  --   HGB 9.5* 9.2* 10.1*  HCT 28.8* 27.9* 30.4*  MCV 89.2 88.9 88.4  PLT 252 215 200   Basic Metabolic Panel: Recent Labs  Lab 12/13/20 0613 12/14/20 0328 12/16/20 0143 12/19/20 0800  NA 142 140 139 138  K 3.6 3.5 3.7 4.0  CL 107 107 108 104  CO2 26 24 22 27   GLUCOSE 172* 230* 165* 110*  BUN 16 19 27* 18  CREATININE 0.53* 0.68 0.65 0.51*  CALCIUM 8.4* 8.3* 8.7* 8.6*  MG 1.7 1.7 2.0  --   PHOS 3.6 2.8  --   --    GFR: Estimated Creatinine Clearance: 66.7 mL/min (A) (by C-G formula based on SCr of 0.51 mg/dL (L)). Liver Function Tests: Recent Labs  Lab 12/13/20 0613 12/14/20 0328  AST 35 30  ALT 31 34  ALKPHOS 101 100  BILITOT 0.5 0.8  PROT 6.0* 5.5*  ALBUMIN 1.9* 1.8*   No results for input(s): LIPASE, AMYLASE in the last 168 hours. Recent Labs  Lab 12/12/20 1120  AMMONIA 17   Coagulation Profile: No  results for input(s): INR, PROTIME in the last 168 hours. Cardiac Enzymes: No results for input(s): CKTOTAL, CKMB, CKMBINDEX, TROPONINI in the last 168 hours. BNP (last 3 results) No results for input(s): PROBNP in the last 8760 hours. HbA1C: No results for input(s): HGBA1C in the last 72 hours. CBG: Recent Labs  Lab 12/18/20 0629 12/18/20 1249 12/18/20 1713 12/18/20 2115 12/19/20 0640  GLUCAP 188* 201* 214* 140* 117*   Lipid Profile: No results for input(s): CHOL, HDL, LDLCALC, TRIG, CHOLHDL, LDLDIRECT in the last 72 hours. Thyroid Function Tests: No results for input(s): TSH, T4TOTAL, FREET4, T3FREE, THYROIDAB in the last 72 hours. Anemia Panel: No results for input(s): VITAMINB12, FOLATE, FERRITIN, TIBC, IRON, RETICCTPCT in the last 72 hours. Sepsis Labs: No results for input(s): PROCALCITON, LATICACIDVEN in the last 168 hours.  Recent Results (from the past 240 hour(s))  MRSA PCR Screening     Status: None   Collection Time: 12/11/20  8:30 PM   Specimen: Nasal Mucosa; Nasopharyngeal   Result Value Ref Range Status   MRSA by PCR NEGATIVE NEGATIVE Final    Comment:        The GeneXpert MRSA Assay (FDA approved for NASAL specimens only), is one component of a comprehensive MRSA colonization surveillance program. It is not intended to diagnose MRSA infection nor to guide or monitor treatment for MRSA infections. Performed at [facility], [address] [place], [place] [address]          Radiology Studies: No results found.      Scheduled Meds: . aspirin EC  81 mg Oral Daily  . atorvastatin  40 mg Oral Daily  . Chlorhexidine Gluconate Cloth  6 each Topical Daily  . collagenase   Topical Daily  . dexamethasone  2 mg Oral Daily   Followed by  . [START ON 12/21/2020] dexamethasone  1 mg Oral Daily  . feeding supplement  237 mL Oral BID BM  . folic acid  1 mg Oral Daily  . insulin aspart  0-9 Units Subcutaneous TID WC  . multivitamin with minerals  1 tablet Oral Daily  . nutrition supplement (JUVEN)  1 packet Oral BID BM  . nystatin  5 mL Oral QID  . pantoprazole  40 mg Oral Daily  . [START ON 12/21/2020] thiamine  100 mg Oral Daily   Continuous Infusions: . thiamine injection 250 mg (12/18/20 1045)     LOS: 14 days    Time spent: 35 minutes    Myriah Boggus A Berline Semrad, MD [facility]   If 7PM-7AM, please contact night-coverage [email or web address]  12/19/2020, 9:49 AM

## 2020-12-19 NOTE — TOC Progression Note (Signed)
Transition of Care [facility]) - Progression Note    Patient Details  Name: Stanley Scott MRN: [id number] Date of Birth: 1951/01/02  Transition of Care [facility] CM/SW Contact  Baldemar Lenis, [place] Phone Number: 12/19/2020, 3:12 PM  Clinical Narrative:   CSW spoke with patient's son, Stanley Scott, to discuss switching his Medicaid to [place]. Stanley Scott has been in contact with both his current Medicaid Child psychotherapist in [place] as well as the [facility] DSS to have the patient's Medicaid switched over, and he turned in the paperwork that they needed this morning. DSS will contact Stanley Scott with what they need moving forward, he is just waiting to hear back from them since he dropped off the paperwork. CSW to follow.    Expected Discharge Plan: Skilled Nursing Facility Barriers to Discharge: Continued Medical Work up  Expected Discharge Plan and Services Expected Discharge Plan: Skilled Nursing Facility In-house Referral: Clinical Social Work Discharge Planning Services: NA Post Acute Care Choice: Skilled Nursing Facility Living arrangements for the past 2 months: Single Family Home                 DME Arranged: N/A DME Agency: NA       HH Arranged: NA HH Agency: NA         Social Determinants of Health (SDOH) Interventions    Readmission Risk Interventions No flowsheet data found.

## 2020-12-20 DIAGNOSIS — A0472 Enterocolitis due to Clostridium difficile, not specified as recurrent: Secondary | ICD-10-CM | POA: Diagnosis not present

## 2020-12-20 LAB — CBC
HCT: 30.5 % — ABNORMAL LOW (ref 39.0–52.0)
Hemoglobin: 9.9 g/dL — ABNORMAL LOW (ref 13.0–17.0)
MCH: 29.5 pg (ref 26.0–34.0)
MCHC: 32.5 g/dL (ref 30.0–36.0)
MCV: 90.8 fL (ref 80.0–100.0)
Platelets: 146 10*3/uL — ABNORMAL LOW (ref 150–400)
RBC: 3.36 MIL/uL — ABNORMAL LOW (ref 4.22–5.81)
RDW: 21.6 % — ABNORMAL HIGH (ref 11.5–15.5)
WBC: 20.8 10*3/uL — ABNORMAL HIGH (ref 4.0–10.5)
nRBC: 0 % (ref 0.0–0.2)

## 2020-12-20 LAB — GLUCOSE, CAPILLARY
Glucose-Capillary: 121 mg/dL — ABNORMAL HIGH (ref 70–99)
Glucose-Capillary: 217 mg/dL — ABNORMAL HIGH (ref 70–99)
Glucose-Capillary: 216 mg/dL — ABNORMAL HIGH (ref 70–99)

## 2020-12-20 LAB — TROPONIN I (HIGH SENSITIVITY)
Troponin I (High Sensitivity): 29 ng/L — ABNORMAL HIGH (ref <18)
Troponin I (High Sensitivity): 29 ng/L — ABNORMAL HIGH (ref <18)

## 2020-12-20 NOTE — Progress Notes (Signed)
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  In the emergency room, responded to IV fluid hydration.  Stool tested positive for C. difficile, treated with oral vancomycin.  Remain extremely debilitated.  On 12/07/2020 patient was noted to be more weak on both sides.  Stat MRI head was negative for acute stroke.  On 12/08/2020, patient continued to have persistent quadriparesis.  MRI of the cervical spine was thought that showed severe cervical stenosis and spinal cord edema.  Started on a steroids.  Discussed with neurosurgery and initiated transfer to [facility] for possible C-spine decompression.  He was seen by neurosurgery on 12/12/20, but thought to be too high risk for surgery given the risks of perioperative morbidity and possible mortality.  Plan for a steroid taper over the next 7 days, therapy evaluation as well as palliative care.    Subjective:  New complaints.  States he would like to get back into bed from the recliner but otherwise is doing okay   Objective: Vitals:   12/20/20 0500 12/20/20 0940 12/20/20 1223 12/20/20 1546  BP:  (!) 93/58 94/63 92/60   Pulse:  89 99 96  Resp:  18 17 18   Temp:  97.9 F (36.6 C) (!) 97.3 F (36.3 C) 97.6 F (36.4 C)  TempSrc:  Oral Oral Oral  SpO2:  99% 100% 100%  Weight: 52.9 kg     Height:        Intake/Output Summary (Last 24 hours) at 12/20/2020 1637 Last data filed at 12/20/2020 1230 Gross per 24 hour  Intake 218 ml  Output 1425 ml  Net -1207 ml    Filed Weights   12/15/20 0438 12/19/20 0341 12/20/20 0500  Weight: 53.9 kg 54.1 kg 52.9 kg     Exam:  General: Chronically ill-appearing looking man looking much older than stated age sitting up in recliner in an ARD Eyes: sclera anicteric, conjuctiva mild injection bilaterally CVS: S1-S2, regular  Respiratory:  decreased air entry bilaterally secondary to decreased inspiratory effort, rales at bases  GI: NABS, soft, NT  LE: No edema.    Assessment & Plan:   70 year old man with severe cervical spinal stenosis and functional quadriplegia is awaiting placement after treatment for C. difficile colitis now resolved.  C. difficile colitis Treated with oral vancomycin from 12/28 through 1/7 with resolution of diarrhea However patient had marked increase in leukocytosis today with WBC up to 20 Diarrhea has not yet started however this is very suggestive Will need to follow clinically and WBC with very low threshold for restarting vancomycin and/or Flagyl  Severe cervical spinal stenosis C3-C5 Thought to be too high risk for surgical procedure per neurosurgery Is presently on steroids getting tapered Waiting placement at [facility] Palliative care is following, patient is DNR  Hyperglycemia Thought to be steroid-induced, continue SSI  Sinus bradycardia Hold nodal agents Seen by cardiology, no indication for pacemaker  History of CVA with residual right hemiparesis Continue aspirin and statin  Anemia History of gastric AVM Status post 2 units PRBC 12/06/2020  Metabolic encephalopathy Waxing and waning mental status Delirium  precautions  Stage II decubitus ulcer Per wound care consult   DVT prophylaxis: CD Code Status: DNR Family Communication: None Disposition Plan:   Patient is from: Home  Anticipated Discharge Location: SNF  Barriers to Discharge: Awaiting SNF bed  Is patient medically stable for Discharge:  Yes   Consultants:  Neurosurgery  Cardiology  Palliative care  Procedures:  None  Antimicrobials:  Pleated 10-day course of p.o. vancomycin   Data Reviewed:  Basic Metabolic Panel: Recent Labs  Lab 12/14/20 0328 12/16/20 0143 12/19/20 0800  NA 140 139 138  K 3.5 3.7 4.0  CL 107 108 104  CO2 24 22 27   GLUCOSE 230* 165* 110*  BUN 19 27* 18  CREATININE 0.68 0.65 0.51*  CALCIUM 8.3* 8.7* 8.6*  MG 1.7 2.0  --   PHOS 2.8  --   --    Liver Function Tests: Recent Labs  Lab 12/14/20 0328  AST 30  ALT 34  ALKPHOS 100  BILITOT 0.8  PROT 5.5*  ALBUMIN 1.8*   No results for input(s): LIPASE, AMYLASE in the last 168 hours. No results for input(s): AMMONIA in the last 168 hours. CBC: Recent Labs  Lab 12/14/20 0328 12/16/20 0143 12/20/20 0210  WBC 3.8* 7.3 20.8*  NEUTROABS 3.1  --   --   HGB 9.2* 10.1* 9.9*  HCT 27.9* 30.4* 30.5*  MCV 88.9 88.4 90.8  PLT 215 200 146*   Cardiac Enzymes: No results for input(s): CKTOTAL, CKMB, CKMBINDEX, TROPONINI in the last 168 hours. BNP (last 3 results) No results for input(s): PROBNP in the last 8760 hours. CBG: Recent Labs  Lab 12/19/20 0640 12/19/20 1124 12/19/20 1630 12/19/20 2131 12/20/20 1251  GLUCAP 117* 76 105* 106* 121*    Recent Results (from the past 240 hour(s))  MRSA PCR Screening     Status: None   Collection Time: 12/11/20  8:30 PM   Specimen: Nasal Mucosa; Nasopharyngeal  Result Value Ref Range Status   MRSA by PCR NEGATIVE NEGATIVE Final    Comment:        The GeneXpert MRSA Assay (FDA approved for NASAL specimens only), is one component of a comprehensive MRSA colonization surveillance program. It is not intended to diagnose MRSA infection nor to guide or monitor treatment for MRSA infections. Performed at [facility], [address] [place], [place]       Studies: No results found.   Scheduled Meds: . aspirin EC  81 mg Oral Daily  . atorvastatin  40 mg  Oral Daily  . Chlorhexidine Gluconate Cloth  6 each Topical Daily  . collagenase   Topical Daily  . [START ON 12/21/2020] dexamethasone  1 mg Oral Daily  . feeding supplement  237 mL Oral BID BM  . folic acid  1 mg Oral Daily  . insulin aspart  0-9 Units Subcutaneous TID WC  . multivitamin with minerals  1 tablet Oral Daily  . nutrition supplement (JUVEN)  1 packet Oral BID BM  . nystatin  5 mL Oral QID  . pantoprazole  40 mg Oral Daily  . [START ON 12/21/2020] thiamine  100 mg Oral Daily   Continuous Infusions: . thiamine injection Stopped (12/18/20 1051)    Principal Problem:   C. difficile diarrhea Active Problems:   Generalized weakness   Hypothermia   Failure to thrive in adult   Hypotension   Symptomatic anemia   Hypoalbuminemia   Prolonged QT interval   Gastric AVM   SIRS (  systemic inflammatory response syndrome) (HCC)   Hypokalemia   Essential hypertension   Pressure injury of skin   Protein-calorie malnutrition, severe   Cervical spinal stenosis   Functional quadriplegia (HCC)   Hyperglycemia   Sinus bradycardia   H/O: CVA (cerebrovascular accident)   Underweight     Jernie Schutt Tublu Marica [place], [facility]  If 7PM-7AM, please contact night-coverage [email or web address] Password [facility] 12/20/2020, 4:37 PM    LOS: 15 days

## 2020-12-20 NOTE — Progress Notes (Signed)
MD notified of pt VS and heart rated up in the 120's with MEWS yellow; new orders received. Will continue to closely monitor pt. Dionne Bucy RN   12/19/20 2345  Vitals  Temp 98.2 F (36.8 C)  Temp Source Oral  BP (!) 90/56  MAP (mmHg) 67  BP Location Right Arm  BP Method Automatic  Patient Position (if appropriate) Lying  Pulse Rate (!) 107  Pulse Rate Source Monitor  Resp 16  MEWS COLOR  MEWS Score Color Yellow  Oxygen Therapy  SpO2 100 %  O2 Device Room Air  MEWS Score  MEWS Temp 0  MEWS Systolic 1  MEWS Pulse 1  MEWS RR 0  MEWS LOC 0  MEWS Score 2

## 2020-12-20 NOTE — Progress Notes (Signed)
Pt MEWS now green at 1 after fluid bolus received. Pt resting in bed with call light within reach. Stanley Scott Analese Sovine RN.   12/20/20 0126  Vitals  BP 109/65  MAP (mmHg) 76  BP Location Right Arm  BP Method Automatic  Patient Position (if appropriate) Lying  Pulse Rate (!) 107  Pulse Rate Source Monitor  Resp 18  MEWS COLOR  MEWS Score Color Green  Oxygen Therapy  SpO2 100 %  O2 Device Room Air  MEWS Score  MEWS Temp 0  MEWS Systolic 0  MEWS Pulse 1  MEWS RR 0  MEWS LOC 0  MEWS Score 1

## 2020-12-20 NOTE — Progress Notes (Signed)
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 Pt found to have severe cervical spine stenosis on 12/30 MRI, transferred to [place].    PT Comments    Patient progressing slowly towards PT goals. Continues to demonstrate marked weakness throughout UEs/LEs/trunk and impaired cognition. Requires step by step cues for sequencing during mobility tasks. Max A of 2 for bed mobility. Attempted standing using stedy however only able to clear bottom minimally and exhibited poor grip strength to hold onto stedy bar. Performed lateral scoot transfer to drop arm recliner with Max A of 2 with minimal initiation. Pt oriented to self only today. Continue to recommend SNF. Will follow.   Follow Up Recommendations  SNF;Supervision/Assistance - 24 hour     Equipment Recommendations  Other (comment) (defer to post acute)    Recommendations for Other Services       Precautions / Restrictions Precautions Precautions: Fall Restrictions Weight Bearing Restrictions: No    Mobility  Bed Mobility Overal bed mobility: Needs Assistance Bed Mobility: Rolling;Sidelying to Sit Rolling: Max assist Sidelying to sit: Max assist;HOB elevated       General bed mobility comments: Step by step cues for sequencing, hand over hand to help LUE reach for rail (poor grip). Assist with LEs and trunk to get to EOB.  Transfers Overall transfer level: Needs assistance Equipment used:  Ambulation equipment used Transfers: Sit to/from Stand;Lateral/Scoot Transfers Sit to Stand: Total assist;+2 physical assistance;+2 safety/equipment;From elevated surface        Lateral/Scoot Transfers: Max assist;+2 physical assistance;From elevated surface General transfer comment: Total A to attempt standing from elevated bed height with use of stedy however pt barely able to clear bottom; not able to grip enough with hands to use stedy, so placed aside. Performed lateral scoot transfer towards right to drop arm recliner, little initiation to assist. Weakness throughout.  Ambulation/Gait             General Gait Details: unable   Stairs             Wheelchair Mobility    Modified Rankin (Stroke Patients Only)       Balance Overall balance assessment: Needs assistance Sitting-balance support: Feet supported;Single extremity supported Sitting balance-Leahy Scale: Poor Sitting balance - Comments: reliant on BUE support of bed   Standing balance support: During functional activity Standing balance-Leahy Scale: Zero Standing balance comment: Not able to stand upright or clear bottom from bed despite assist of 2 and use of stedy (stedy does not work as pt with poor grip strength bilaterally)                            Cognition Arousal/Alertness: Awake/alert Behavior During Therapy: WFL for tasks assessed/performed Overall Cognitive Status: No family/caregiver present to determine baseline cognitive functioning                                 General Comments: Follows simple commands with  increased time and repetition. oriented to self, thinks he is in [facility]. Does not know the date or situation. Poor awareness of deficits, safety and current medical situation. "I get confused sometimes." Reports being OOB yesterday but has not been. Poor memory and attention. Slow processing.      Exercises      General Comments General comments  (skin integrity, edema, etc.): No dizziness reported.      Pertinent Vitals/Pain Pain Assessment: Faces Faces Pain Scale: Hurts little more Pain Location: generalized with movement Pain Descriptors / Indicators: Grimacing Pain Intervention(s): Monitored during session    Home Living                      Prior Function            PT Goals (current goals can now be found in the care plan section) Acute Rehab PT Goals Patient Stated Goal: to get stronger PT Goal Formulation: With patient Time For Goal Achievement: 01/03/21 Potential to Achieve Goals: Fair Progress towards PT goals: Progressing toward goals (slowly)    Frequency    Min 2X/week      PT Plan Current plan remains appropriate    Co-evaluation              AM-PAC PT "6 Clicks" Mobility   Outcome Measure  Help needed turning from your back to your side while in a flat bed without using bedrails?: A Lot Help needed moving from lying on your back to sitting on the side of a flat bed without using bedrails?: A Lot Help needed moving to and from a bed to a chair (including a wheelchair)?: Total Help needed standing up from a chair using your arms (e.g., wheelchair or bedside chair)?: Total Help needed to walk in hospital room?: Total Help needed climbing 3-5 steps with a railing? : Total 6 Click Score: 8    End of Session Equipment Utilized During Treatment: Gait belt Activity Tolerance: Patient limited by fatigue Patient left: in chair;with call bell/phone within reach;with chair alarm set;with nursing/sitter in room Nurse Communication: Mobility status;Other (comment) (lateral scoot with drop arm down for transfer with assist of 2) PT Visit Diagnosis: Unsteadiness on feet (R26.81);Other abnormalities of gait and mobility (R26.89);Muscle weakness (generalized) (M62.81)     Time: 9458-5929 PT Time Calculation (min) (ACUTE ONLY): 24 min  Charges:  $Therapeutic Activity: 23-37 mins                      Vale Haven, PT, DPT Acute Rehabilitation Services Pager [phone] Office [phone]       Blake Divine A Lanier Ensign 12/20/2020, 11:26 AM

## 2020-12-21 DIAGNOSIS — D696 Thrombocytopenia, unspecified: Secondary | ICD-10-CM | POA: Diagnosis not present

## 2020-12-21 DIAGNOSIS — M4802 Spinal stenosis, cervical region: Secondary | ICD-10-CM | POA: Diagnosis not present

## 2020-12-21 LAB — BASIC METABOLIC PANEL
Anion gap: 10 (ref 5–15)
BUN: 17 mg/dL (ref 8–23)
CO2: 22 mmol/L (ref 22–32)
Calcium: 8.6 mg/dL — ABNORMAL LOW (ref 8.9–10.3)
Chloride: 103 mmol/L (ref 98–111)
Creatinine, Ser: 0.59 mg/dL — ABNORMAL LOW (ref 0.61–1.24)
GFR, Estimated: >60 mL/min (ref >60)
Glucose, Bld: 211 mg/dL — ABNORMAL HIGH (ref 70–99)
Potassium: 4.5 mmol/L (ref 3.5–5.1)
Sodium: 135 mmol/L (ref 135–145)

## 2020-12-21 LAB — CBC WITH DIFFERENTIAL/PLATELET
Abs Immature Granulocytes: 0.19 10*3/uL — ABNORMAL HIGH (ref 0.00–0.07)
Basophils Absolute: 0 10*3/uL (ref 0.0–0.1)
Basophils Relative: 0 %
Eosinophils Absolute: 0 10*3/uL (ref 0.0–0.5)
Eosinophils Relative: 0 %
HCT: 29.7 % — ABNORMAL LOW (ref 39.0–52.0)
Hemoglobin: 9.2 g/dL — ABNORMAL LOW (ref 13.0–17.0)
Immature Granulocytes: 1 %
Lymphocytes Relative: 2 %
Lymphs Abs: 0.4 10*3/uL — ABNORMAL LOW (ref 0.7–4.0)
MCH: 28.9 pg (ref 26.0–34.0)
MCHC: 31 g/dL (ref 30.0–36.0)
MCV: 93.4 fL (ref 80.0–100.0)
Monocytes Absolute: 1.3 10*3/uL — ABNORMAL HIGH (ref 0.1–1.0)
Monocytes Relative: 7 %
Neutro Abs: 15.5 10*3/uL — ABNORMAL HIGH (ref 1.7–7.7)
Neutrophils Relative %: 90 %
Platelets: 136 10*3/uL — ABNORMAL LOW (ref 150–400)
RBC: 3.18 MIL/uL — ABNORMAL LOW (ref 4.22–5.81)
RDW: 21.7 % — ABNORMAL HIGH (ref 11.5–15.5)
WBC: 17.4 10*3/uL — ABNORMAL HIGH (ref 4.0–10.5)
nRBC: 0 % (ref 0.0–0.2)

## 2020-12-21 LAB — GLUCOSE, CAPILLARY
Glucose-Capillary: 140 mg/dL — ABNORMAL HIGH (ref 70–99)
Glucose-Capillary: 137 mg/dL — ABNORMAL HIGH (ref 70–99)
Glucose-Capillary: 241 mg/dL — ABNORMAL HIGH (ref 70–99)
Glucose-Capillary: 237 mg/dL — ABNORMAL HIGH (ref 70–99)

## 2020-12-21 LAB — VITAMIN B1: Vitamin B1 (Thiamine): 197.7 nmol/L (ref 66.5–200.0)

## 2020-12-21 NOTE — Progress Notes (Signed)
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 In the emergency room, responded to IV fluid hydration. Stool tested positive for C. difficile, treated with oral vancomycin.     On 12/07/2020 patient was noted to be more weak on both sides. Stat MRI head was negative for acute stroke. On 12/08/2020, patient continued to have persistent quadriparesis. MRI of the cervical spine was thought that showed severe cervical stenosis and spinal cord edema. Started on a steroids. Discussed with neurosurgery and initiated transfer to [facility] for possible C-spine decompression. He was seen by neurosurgery on 12/12/20, but thought to be too high risk for surgery given the risks of perioperative morbidity and possible mortality. Plan for a steroid taper over the next 7days, therapy evaluation as well as palliative care.    Subjective: He has no complaints.     Assessment & Plan:   Principal Problem:   C. difficile colitis - resolved after treatment with Vancomycin from 12/28-1/7 - Active Problems:   Cervical spinal stenosis- severe at C3-C5 - continue steroid taper- per NS, he is too high rick for decompression surgery - he is able to stand but had trouble remaining fully upright - appreciate palliative care involvement   Hyperglycemia and leukocytosis - due to steroids- improving  Acute thrombocytopenia - follow- he is not on any heparin products - etiology uncertain   Normocytic anemia- h/o gastric AVMs - likely  AOCD - s/o 2 U PRBC on 12/28  Sinus bradycardia - no indication for PPM per cardiology - avoid nodal blocker  Acute metabolic encephalopathy - apparently having intermittent confusion- today is oriented  Stage 2 sacral pressure ulcer -  Pressure Injury 12/06/20 Sacrum Medial Stage 2 -  Partial thickness loss of dermis presenting as a shallow open injury with a red, pink wound bed without slough. (Active)  12/06/20 1700  Location: Sacrum  Location Orientation: Medial  Staging: Stage 2 -  Partial thickness loss of dermis presenting as a shallow open injury with a red, pink wound bed without slough.  Wound Description (Comments):   Present on Admission: Yes     Time spent in minutes: 30 DVT prophylaxis: SCDs Start: Dec 14, 2020 1913  Code Status: DNR Family Communication:  Disposition Plan:  Status is: Inpatient  Remains inpatient appropriate because:awaiting SNF bed   Dispo: The patient is from: Home              Anticipated d/c is to: SNF              Anticipated d/c date is: > 3 days              Patient currently is medically stable to d/c.      Consultants:   NS  Cardiology  Palliative care Procedures:     Antimicrobials:  Anti-infectives (From admission, onward)   Start     Dose/Rate Route Frequency Ordered Stop   12/06/20 1830  vancomycin (VANCOCIN) 50 mg/mL oral solution 125 mg        125 mg Oral 4 times daily 12/06/20 1738 12/16/20 1759  2020-12-23 1300  cefTRIAXone (ROCEPHIN) 1 g in sodium chloride 0.9 % 100 mL IVPB  Status:  Discontinued        1 g 200 mL/hr over 30 Minutes Intravenous Every 24 hours December 23, 2020 1259 12-23-20 2218       Objective: Vitals:   12/21/20 0300 12/21/20 0409 12/21/20 0815 12/21/20 1139  BP:  116/71 (!) 120/58 109/68  Pulse:  95 89 92  Resp:  16 14 14   Temp:  (!) 97.5 F (36.4 C) 97.6 F (36.4 C) 97.9 F (36.6 C)  TempSrc:  Oral Oral Oral  SpO2:  100% 100% 100%  Weight: 52.9 kg     Height:        Intake/Output  Summary (Last 24 hours) at 12/21/2020 1304 Last data filed at 12/21/2020 1247 Gross per 24 hour  Intake 1680 ml  Output 600 ml  Net 1080 ml   Filed Weights   12/19/20 0341 12/20/20 0500 12/21/20 0300  Weight: 54.1 kg 52.9 kg 52.9 kg    Examination: General exam: Appears comfortable  HEENT: PERRLA, oral mucosa moist, no sclera icterus or thrush Respiratory system: Clear to auscultation. Respiratory effort normal. Cardiovascular system: S1 & S2 heard, RRR.   Gastrointestinal system: Abdomen soft, non-tender, nondistended. Normal bowel sounds. Central nervous system: Alert and oriented. Upper extremities 4/5, LE 2/5 strength Extremities: No cyanosis, clubbing or edema Skin: No rashes or ulcers Psychiatry:  Mood & affect appropriate.     Data Reviewed: I have personally reviewed following labs and imaging studies  CBC: Recent Labs  Lab 12/16/20 0143 12/20/20 0210 12/21/20 0340  WBC 7.3 20.8* 17.4*  NEUTROABS  --   --  15.5*  HGB 10.1* 9.9* 9.2*  HCT 30.4* 30.5* 29.7*  MCV 88.4 90.8 93.4  PLT 200 146* 136*   Basic Metabolic Panel: Recent Labs  Lab 12/16/20 0143 12/19/20 0800 12/21/20 0340  NA 139 138 135  K 3.7 4.0 4.5  CL 108 104 103  CO2 22 27 22   GLUCOSE 165* 110* 211*  BUN 27* 18 17  CREATININE 0.65 0.51* 0.59*  CALCIUM 8.7* 8.6* 8.6*  MG 2.0  --   --    GFR: Estimated Creatinine Clearance: 65.2 mL/min (A) (by C-G formula based on SCr of 0.59 mg/dL (L)). Liver Function Tests: No results for input(s): AST, ALT, ALKPHOS, BILITOT, PROT, ALBUMIN in the last 168 hours. No results for input(s): LIPASE, AMYLASE in the last 168 hours. No results for input(s): AMMONIA in the last 168 hours. Coagulation Profile: No results for input(s): INR, PROTIME in the last 168 hours. Cardiac Enzymes: No results for input(s): CKTOTAL, CKMB, CKMBINDEX, TROPONINI in the last 168 hours. BNP (last 3 results) No results for input(s): PROBNP in the last 8760 hours. HbA1C: No  results for input(s): HGBA1C in the last 72 hours. CBG: Recent Labs  Lab 12/20/20 1251 12/20/20 1703 12/20/20 2106 12/21/20 0605 12/21/20 1135  GLUCAP 121* 217* 216* 140* 137*   Lipid Profile: No results for input(s): CHOL, HDL, LDLCALC, TRIG, CHOLHDL, LDLDIRECT in the last 72 hours. Thyroid Function Tests: No results for input(s): TSH, T4TOTAL, FREET4, T3FREE, THYROIDAB in the last 72 hours. Anemia Panel: No results for input(s): VITAMINB12, FOLATE, FERRITIN, TIBC, IRON, RETICCTPCT in the last 72 hours. Urine analysis:    Component Value Date/Time   COLORURINE YELLOW 2020/12/23 1257   APPEARANCEUR CLEAR 2020/12/23 1257   LABSPEC 1.010 2020-12-23 1257   PHURINE 6.0 12-23-2020 1257   GLUCOSEU NEGATIVE 12-23-2020 1257   HGBUR  NEGATIVE 11/20/2020 1257   BILIRUBINUR NEGATIVE 11/12/2020 1257   KETONESUR 5 (A) 11/21/2020 1257   PROTEINUR NEGATIVE 12/07/2020 1257   NITRITE NEGATIVE 11/16/2020 1257   LEUKOCYTESUR NEGATIVE 12/09/2020 1257   Sepsis Labs: @LABRCNTIP (procalcitonin:4,lacticidven:4) ) Recent Results (from the past 240 hour(s))  MRSA PCR Screening     Status: None   Collection Time: 12/11/20  8:30 PM   Specimen: Nasal Mucosa; Nasopharyngeal  Result Value Ref Range Status   MRSA by PCR NEGATIVE NEGATIVE Final    Comment:        The GeneXpert MRSA Assay (FDA approved for NASAL specimens only), is one component of a comprehensive MRSA colonization surveillance program. It is not intended to diagnose MRSA infection nor to guide or monitor treatment for MRSA infections. Performed at [facility], [address], [place], [place]          Radiology Studies: No results found.    Scheduled Meds: . aspirin EC  81 mg Oral Daily  . atorvastatin  40 mg Oral Daily  . Chlorhexidine Gluconate Cloth  6 each Topical Daily  . collagenase   Topical Daily  . dexamethasone  1 mg Oral Daily  . feeding supplement  237 mL Oral BID BM  . folic acid  1  mg Oral Daily  . insulin aspart  0-9 Units Subcutaneous TID WC  . multivitamin with minerals  1 tablet Oral Daily  . nutrition supplement (JUVEN)  1 packet Oral BID BM  . nystatin  5 mL Oral QID  . pantoprazole  40 mg Oral Daily  . thiamine  100 mg Oral Daily   Continuous Infusions:   LOS: 16 days      [id number], MD [facility] Pager: [email or web address] 12/21/2020, 1:04 PM

## 2020-12-21 NOTE — Progress Notes (Addendum)
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 Pt found to have severe cervical spine stenosis on 12/30 MRI, transferred to [place].   OT comments  OT treatment session with focus on functional transfers, self-care re-education and OOB activity tolerance. Patient able to stand from recliner x3 trials but only able to attain full upright position 1/3 trials with bilateral knee block and tactile cues on buttocks to facilitate hip extension to neutral. Patient set up with meal tray demonstrating ability to bring spoon from tray to mouth with built up foam grip. Patient with difficulty scooping items on tray requiring Mod A. Patient would benefit from continued acute OT services to maximize safety and independence and decrease caregiver burden. Recommendation for SNF remains appropriate.    Follow Up Recommendations  SNF    Equipment Recommendations  None recommended by OT    Recommendations for Other Services      Precautions / Restrictions Precautions Precautions: Fall Restrictions Weight Bearing Restrictions: No       Mobility Bed Mobility Overal bed mobility: Needs Assistance             General bed mobility comments: Patient seated in recliner upon entry.  Transfers Overall transfer level: Needs assistance Equipment used: 2 person hand held assist Transfers: Sit to/from Stand Sit to Stand: Max assist;+2 physical assistance          General transfer comment: +2 hand held assist and bilateral knee block for sit to stand from recliner x3 trials. Patient only able to come to full upright position 1/3 trials. Able to boost bottom several feet from chair surface this date but difficulty extending knees/hips.    Balance Overall balance assessment: Needs assistance Sitting-balance support: Feet supported;Single extremity supported Sitting balance-Leahy Scale: Poor Sitting balance - Comments: Patient able to maintain static sitting in recliner in unsupported position with Min A and use of BUE on armrests. Postural control: Posterior lean Standing balance support: During functional activity Standing balance-Leahy Scale: Zero Standing balance comment: +2 assist to stand from recliner. Able to come to full upright position 1/3 trials with increased time/effort and Max A +2 with bilateral knee block.                           ADL either performed or assessed with clinical judgement   ADL Overall ADL's : Needs assistance/impaired Eating/Feeding: Moderate assistance;Sitting;Bed level Eating/Feeding Details (indicate cue type and reason): Able to initiate self-feeding with use of spoon and built-up foam. Difficulty scooping items on meal tray but can bring spoon to mouth. Grooming: Therapist, nutritional;Moderate assistance;Bed level Grooming Details (indicate cue type and reason): Requires hand over hand assist 2/2 decreased FMC/GCM in LUE. Unable to complete task with RUE.                                     Vision       Perception     Praxis  Cognition Arousal/Alertness: Awake/alert Behavior During Therapy: WFL for tasks assessed/performed Overall Cognitive Status: No family/caregiver present to determine baseline cognitive functioning                                 General Comments: Patient oriented to place but not time or situation. Patient able to follow 1-step commands with  increased time and repeat reminders. Patient with memory deficits. Poor historian.        Exercises     Shoulder Instructions       General Comments VSS    Pertinent Vitals/ Pain       Pain Assessment: Faces Faces Pain Scale: Hurts a little bit Pain Location: generalized with movement Pain Descriptors / Indicators: Grimacing Pain Intervention(s): Monitored during session;Repositioned  Home Living                                          Prior Functioning/Environment              Frequency  Min 2X/week        Progress Toward Goals  OT Goals(current goals can now be found in the care plan section)  Progress towards OT goals: Progressing toward goals  Acute Rehab OT Goals Patient Stated Goal: to get stronger OT Goal Formulation: With patient Time For Goal Achievement: 12/21/20 Potential to Achieve Goals: Fair ADL Goals Pt Will Perform Eating: with min assist;sitting Pt Will Perform Grooming: with min assist;sitting Pt Will Transfer to Toilet: with mod assist;stand pivot transfer;bedside commode Pt Will Perform Toileting - Clothing Manipulation and hygiene: with mod assist;sitting/lateral leans;sit to/from stand Pt/caregiver will Perform Home Exercise Program: Increased strength;Both right and left upper extremity;With minimal assist;With written HEP provided  Plan Discharge plan remains appropriate;Frequency remains appropriate    Co-evaluation                 AM-PAC OT "6 Clicks" Daily Activity     Outcome Measure   Help from another person eating meals?: A Lot Help from another person taking care of personal grooming?: A Lot Help from another person toileting, which includes using toliet, bedpan, or urinal?: Total Help from another person bathing (including washing, rinsing, drying)?: A Lot Help from another person to put on and taking off regular upper body clothing?: A Lot Help from another person to put on and taking off  regular lower body clothing?: A Lot 6 Click Score: 11    End of Session Equipment Utilized During Treatment: Gait belt  OT Visit Diagnosis: Muscle weakness (generalized) (M62.81);Repeated falls (R29.6);Other symptoms and signs involving cognitive function   Activity Tolerance Patient tolerated treatment well   Patient Left in chair;with call bell/phone within reach;with chair alarm set   Nurse Communication          Time: 9379-0240 OT Time Calculation (min): 23 min  Charges: OT General Charges $OT Visit: 1 Visit OT Treatments $Self Care/Home Management : 8-22 mins $Therapeutic Activity: 8-22 mins  Marypat Kimmet H. OTR/L Supplemental OT, Department of rehab services [phone]   Madie Cahn R H. 12/21/2020, 12:46 PM

## 2020-12-21 NOTE — TOC Progression Note (Signed)
Transition of Care [facility]) - Progression Note    Patient Details  Name: Stanley Scott MRN: [id number] Date of Birth: Jun 24, 1951  Transition of Care [facility]) CM/SW Contact  Baldemar Lenis, [place] Phone Number: 12/21/2020, 3:17 PM  Clinical Narrative:   CSW reached out to [facility], who had previously offered on patient, to ask if they would be able to take the patient now that the Medicaid process has been started to switch to [place]. CSW explained that the son had dropped off application paperwork on Monday, so the process had been started. Admissions is asking Administration, and will get back to CSW with update.     Expected Discharge Plan: Skilled Nursing Facility Barriers to Discharge: Continued Medical Work up  Expected Discharge Plan and Services Expected Discharge Plan: Skilled Nursing Facility In-house Referral: Clinical Social Work Discharge Planning Services: NA Post Acute Care Choice: Skilled Nursing Facility Living arrangements for the past 2 months: Single Family Home                 DME Arranged: N/A DME Agency: NA       HH Arranged: NA HH Agency: NA         Social Determinants of Health (SDOH) Interventions    Readmission Risk Interventions No flowsheet data found.

## 2020-12-22 LAB — CBC
HCT: 30.8 % — ABNORMAL LOW (ref 39.0–52.0)
Hemoglobin: 9.7 g/dL — ABNORMAL LOW (ref 13.0–17.0)
MCH: 28.9 pg (ref 26.0–34.0)
MCHC: 31.5 g/dL (ref 30.0–36.0)
MCV: 91.7 fL (ref 80.0–100.0)
Platelets: 138 10*3/uL — ABNORMAL LOW (ref 150–400)
RBC: 3.36 MIL/uL — ABNORMAL LOW (ref 4.22–5.81)
RDW: 21.8 % — ABNORMAL HIGH (ref 11.5–15.5)
WBC: 14.6 10*3/uL — ABNORMAL HIGH (ref 4.0–10.5)
nRBC: 0 % (ref 0.0–0.2)

## 2020-12-22 LAB — GLUCOSE, CAPILLARY
Glucose-Capillary: 174 mg/dL — ABNORMAL HIGH (ref 70–99)
Glucose-Capillary: 136 mg/dL — ABNORMAL HIGH (ref 70–99)
Glucose-Capillary: 144 mg/dL — ABNORMAL HIGH (ref 70–99)
Glucose-Capillary: 207 mg/dL — ABNORMAL HIGH (ref 70–99)

## 2020-12-22 MED ORDER — THIAMINE HCL 100 MG PO TABS
100.0000 mg | ORAL_TABLET | Freq: Every day | ORAL | Status: DC
  Administered 2020-12-22 – 2020-12-23 (×2): 100 mg via ORAL
  Filled 2020-12-22: qty 1

## 2020-12-22 NOTE — TOC Progression Note (Signed)
Transition of Care [facility]) - Progression Note    Patient Details  Name: Stanley Scott MRN: [id number] Date of Birth: [birth year]  Transition of Care [facility]) CM/SW Contact  Baldemar Lenis, [place] Phone Number: 12/22/2020, 12:16 PM  Clinical Narrative:   CSW received a call from [facility] that they have no beds available to accept the patient at this time due to a COVID outbreak. No other bed offers at this time, and pending transition to [facility] Medicaid. CSW to follow.    Expected Discharge Plan: Skilled Nursing Facility Barriers to Discharge: Continued Medical Work up  Expected Discharge Plan and Services Expected Discharge Plan: Skilled Nursing Facility In-house Referral: Clinical Social Work Discharge Planning Services: NA Post Acute Care Choice: Skilled Nursing Facility Living arrangements for the past 2 months: Single Family Home                 DME Arranged: N/A DME Agency: NA       HH Arranged: NA HH Agency: NA         Social Determinants of Health (SDOH) Interventions    Readmission Risk Interventions No flowsheet data found.

## 2020-12-22 NOTE — Progress Notes (Signed)
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 Pt found to have severe cervical spine stenosis on 12/30 MRI, transferred to [place].    PT Comments    Pt tolerates treatment well but remains profoundly weak at this time. Pt continues to require max-totalA to perform all functional mobility due to weakness with BUE weakness more significant than LE weakness. Pt requires assistance for all exercise due to weakness, making it difficult to provide an HEP as this PT has not observed any family members present to assist during therapy sessions. Pt will continue to benefit from attempts at mobilizing and exercise to improve strength and to reduce caregiver burden. PT continues to recommend SNF placement at this time.   Follow Up Recommendations  SNF;Supervision/Assistance - 24 hour     Equipment Recommendations  Hospital bed;Wheelchair (measurements PT);Wheelchair cushion (measurements PT);Other (comment) (mechanical lift, if equipment is not already owned)    Recommendations for Other Services       Precautions / Restrictions Precautions Precautions: Fall Restrictions Weight Bearing Restrictions: No    Mobility  Bed Mobility Overal bed mobility: Needs Assistance Bed Mobility: Rolling;Sidelying to Sit;Sit to Supine Rolling: Max assist Sidelying to sit: Max assist;HOB elevated   Sit to supine: Total assist      Transfers Overall transfer level: Needs  assistance Equipment used: 2 person hand held assist Transfers: Sit to/from Stand Sit to Stand: Max assist;From elevated surface         General transfer comment: bilateral UE support and knee block, cues for hip and trunk extension  Ambulation/Gait                 Stairs             Wheelchair Mobility    Modified Rankin (Stroke Patients Only)       Balance Overall balance assessment: Needs assistance Sitting-balance support: Feet supported;Bilateral upper extremity supported Sitting balance-Leahy Scale: Poor Sitting balance - Comments: reliant on BUE support and minA for static sitting   Standing balance support: Bilateral upper extremity supported Standing balance-Leahy Scale: Zero Standing balance comment: maxA with BUE support briefly, then totalA                            Cognition Arousal/Alertness: Awake/alert Behavior During Therapy: WFL for tasks assessed/performed Overall Cognitive Status: Impaired/Different from baseline Area of Impairment: Attention;Memory;Following commands;Safety/judgement;Awareness;Problem solving                   Current Attention Level: Sustained Memory: Decreased recall of precautions;Decreased short-term memory Following Commands: Follows one step commands with increased time Safety/Judgement: Decreased awareness of safety;Decreased awareness of deficits Awareness: Intellectual Problem Solving: Slow processing;Decreased initiation;Requires verbal cues;Difficulty sequencing;Requires tactile cues General Comments: pt has been hallucinating a dog in the corner of his room near the window      Exercises General Exercises - Upper Extremity Shoulder Flexion: AAROM;Both;10 reps Elbow Flexion: AROM;Both;10 reps Elbow Extension: AROM;Both;10 reps Wrist Flexion: PROM;Both;10 reps Wrist Extension: PROM;Both;10 reps  Digit Composite Flexion: PROM;Right;10 reps;AROM;Left Composite Extension: PROM;Right;10  reps;AROM;Left General Exercises - Lower Extremity Ankle Circles/Pumps: AROM;Both;10 reps Quad Sets: AROM;Both;5 reps Heel Slides: AAROM;Both;5 reps Hip ABduction/ADduction: AROM;Both;5 reps Straight Leg Raises: AAROM;Both;5 reps    General Comments General comments (skin integrity, edema, etc.): VSS on RA. pt does report some dizziness with sitting, returned to supine with resolution of symptoms      Pertinent Vitals/Pain Pain Assessment: Faces Faces Pain Scale: Hurts little more Pain Location: generalized with movement Pain Descriptors / Indicators: Grimacing Pain Intervention(s): Monitored during session    Home Living                      Prior Function            PT Goals (current goals can now be found in the care plan section) Acute Rehab PT Goals Patient Stated Goal: to get stronger Progress towards PT goals: Not progressing toward goals - comment (remains weak, cognition fluctuates with recent hallucinations)    Frequency    Min 2X/week      PT Plan Current plan remains appropriate    Co-evaluation              AM-PAC PT "6 Clicks" Mobility   Outcome Measure  Help needed turning from your back to your side while in a flat bed without using bedrails?: A Lot Help needed moving from lying on your back to sitting on the side of a flat bed without using bedrails?: A Lot Help needed moving to and from a bed to a chair (including a wheelchair)?: A Lot Help needed standing up from a chair using your arms (e.g., wheelchair or bedside chair)?: A Lot Help needed to walk in hospital room?: Total Help needed climbing 3-5 steps with a railing? : Total 6 Click Score: 10    End of Session   Activity Tolerance: Patient tolerated treatment well Patient left: in bed;with call bell/phone within reach;with bed alarm set;with SCD's reapplied Nurse Communication: Mobility status;Need for lift equipment PT Visit Diagnosis: Unsteadiness on feet (R26.81);Other  abnormalities of gait and mobility (R26.89);Muscle weakness (generalized) (M62.81)     Time: 9470-9628 PT Time Calculation (min) (ACUTE ONLY): 33 min  Charges:  $Therapeutic Exercise: 8-22 mins $Therapeutic Activity: 8-22 mins                     Arlyss Gandy, PT, DPT Acute Rehabilitation Pager: [phone]    Arlyss Gandy 12/22/2020, 4:21 PM

## 2020-12-22 NOTE — Progress Notes (Signed)
PROGRESS NOTE    Stanley Scott   ION:[id number]  DOB: 1950/12/19  DOA: 12-23-20 PCP: Stanley Deiters, MD   Brief Narrative:  Stanley Scott 70 year old with past medical history significant for recent left MCA stroke 10/21 status post tPA but residual right hemiparesis, left carotid stenosis status post carotid endarterectomy, hypertension, GI bleed from gastric AVM,recent treatment of UTI with Keflex presents to the ED with multiple loose stools, electrolyte abnormalities, hypothermia and low blood pressure. In the emergency room, responded to IV fluid hydration. Stool tested positive for C. difficile, treated with oral vancomycin.     On 12/07/2020 patient was noted to be more weak on both sides. Stat MRI head was negative for acute stroke. On 12/08/2020, patient continued to have persistent quadriparesis. MRI of the cervical spine was thought that showed severe cervical stenosis and spinal cord edema. Started on a steroids. Discussed with neurosurgery and initiated transfer to [facility] for possible C-spine decompression. He was seen by neurosurgery on 12/12/20, but thought to be too high risk for surgery given the risks of perioperative morbidity and possible mortality. Plan for a steroid taper over the next 7days, therapy evaluation as well as palliative care.    Subjective: No complaints.     Assessment & Plan:   Principal Problem:   C. difficile colitis - resolved after treatment with Vancomycin from 12/28-1/7- he is quite stable   Active Problems:   Cervical spinal stenosis- severe at C3-C5 - continue Decadron taper - per NS, he is too high rick for decompression surgery - he is able to stand but had trouble remaining fully upright - awaiting SNF bed - appreciate palliative care involvement   Hyperglycemia and leukocytosis - due to steroids- improving as steroids being weaned  Acute thrombocytopenia - follow - he is not on any heparin products-  platelets are stable today - etiology uncertain   Normocytic anemia- h/o gastric AVMs - likely AOCD - s/o 2 U PRBC on 12/28  Sinus bradycardia - no indication for PPM per cardiology - avoid nodal blocker  Acute metabolic encephalopathy - apparently having intermittent confusion- remains oriented for me today  Stage 2 sacral pressure ulcer Pressure Injury 12/06/20 Sacrum Medial Stage 2 -  Partial thickness loss of dermis presenting as a shallow open injury with a red, pink wound bed without slough. (Active)  12/06/20 1700  Location: Sacrum  Location Orientation: Medial  Staging: Stage 2 -  Partial thickness loss of dermis presenting as a shallow open injury with a red, pink wound bed without slough.  Wound Description (Comments):   Present on Admission: Yes     Time spent in minutes: 30 DVT prophylaxis: SCDs Start: December 23, 2020 1913 Code Status: DNR Family Communication:  Disposition Plan:  Status is: Inpatient  Remains inpatient appropriate because:awaiting SNF bed   Dispo: The patient is from: Home              Anticipated d/c is to: SNF              Anticipated d/c date is: > 3 days              Patient currently is medically stable to d/c.  Consultants:   NS  Cardiology  Palliative care Procedures:     Antimicrobials:  Anti-infectives (From admission, onward)   Start     Dose/Rate Route Frequency Ordered Stop   12/06/20 1830  vancomycin (VANCOCIN) 50 mg/mL oral solution 125 mg  125 mg Oral 4 times daily 12/06/20 1738 12/16/20 1759   2020/12/07 1300  cefTRIAXone (ROCEPHIN) 1 g in sodium chloride 0.9 % 100 mL IVPB  Status:  Discontinued        1 g 200 mL/hr over 30 Minutes Intravenous Every 24 hours 12-07-2020 1259 07-Dec-2020 2218       Objective: Vitals:   12/22/20 0036 12/22/20 0447 12/22/20 0738 12/22/20 1151  BP: 127/66 101/65 115/70 116/67  Pulse: 98 95 (!) 101 (!) 106  Resp: 18 18 16 16   Temp:  97.7 F (36.5 C) 97.8 F (36.6 C) 98.1 F (36.7  C)  TempSrc:  Oral Axillary Oral  SpO2: 99% 100% 100% 100%  Weight:      Height:        Intake/Output Summary (Last 24 hours) at 12/22/2020 1311 Last data filed at 12/22/2020 0900 Gross per 24 hour  Intake 1270 ml  Output 2000 ml  Net -730 ml   Filed Weights   12/19/20 0341 12/20/20 0500 12/21/20 0300  Weight: 54.1 kg 52.9 kg 52.9 kg    Examination: General exam: Appears comfortable  HEENT: PERRLA, oral mucosa moist, no sclera icterus or thrush Respiratory system: Clear to auscultation. Respiratory effort normal. Cardiovascular system: S1 & S2 heard,  No murmurs  Gastrointestinal system: Abdomen soft, non-tender, nondistended. Normal bowel sounds   Central nervous system: Alert and oriented. No focal neurological deficits. Extremities: No cyanosis, clubbing or edema Skin: No rashes or ulcers Psychiatry:  Mood & affect appropriate.    Data Reviewed: I have personally reviewed following labs and imaging studies  CBC: Recent Labs  Lab 12/16/20 0143 12/20/20 0210 12/21/20 0340 12/22/20 0317  WBC 7.3 20.8* 17.4* 14.6*  NEUTROABS  --   --  15.5*  --   HGB 10.1* 9.9* 9.2* 9.7*  HCT 30.4* 30.5* 29.7* 30.8*  MCV 88.4 90.8 93.4 91.7  PLT 200 146* 136* 138*   Basic Metabolic Panel: Recent Labs  Lab 12/16/20 0143 12/19/20 0800 12/21/20 0340  NA 139 138 135  K 3.7 4.0 4.5  CL 108 104 103  CO2 22 27 22   GLUCOSE 165* 110* 211*  BUN 27* 18 17  CREATININE 0.65 0.51* 0.59*  CALCIUM 8.7* 8.6* 8.6*  MG 2.0  --   --    GFR: Estimated Creatinine Clearance: 65.2 mL/min (A) (by C-G formula based on SCr of 0.59 mg/dL (L)). Liver Function Tests: No results for input(s): AST, ALT, ALKPHOS, BILITOT, PROT, ALBUMIN in the last 168 hours. No results for input(s): LIPASE, AMYLASE in the last 168 hours. No results for input(s): AMMONIA in the last 168 hours. Coagulation Profile: No results for input(s): INR, PROTIME in the last 168 hours. Cardiac Enzymes: No results for  input(s): CKTOTAL, CKMB, CKMBINDEX, TROPONINI in the last 168 hours. BNP (last 3 results) No results for input(s): PROBNP in the last 8760 hours. HbA1C: No results for input(s): HGBA1C in the last 72 hours. CBG: Recent Labs  Lab 12/21/20 1135 12/21/20 1644 12/21/20 2027 12/22/20 0604 12/22/20 1146  GLUCAP 137* 241* 237* 174* 136*   Lipid Profile: No results for input(s): CHOL, HDL, LDLCALC, TRIG, CHOLHDL, LDLDIRECT in the last 72 hours. Thyroid Function Tests: No results for input(s): TSH, T4TOTAL, FREET4, T3FREE, THYROIDAB in the last 72 hours. Anemia Panel: No results for input(s): VITAMINB12, FOLATE, FERRITIN, TIBC, IRON, RETICCTPCT in the last 72 hours. Urine analysis:    Component Value Date/Time   COLORURINE YELLOW Dec 07, 2020 1257   APPEARANCEUR CLEAR December 07, 2020 1257  LABSPEC 1.010 10-Dec-2020 1257   PHURINE 6.0 10-Dec-2020 1257   GLUCOSEU NEGATIVE 12-10-20 1257   HGBUR NEGATIVE 12/10/2020 1257   BILIRUBINUR NEGATIVE 10-Dec-2020 1257   KETONESUR 5 (A) 12-10-20 1257   PROTEINUR NEGATIVE 2020-12-10 1257   NITRITE NEGATIVE December 10, 2020 1257   LEUKOCYTESUR NEGATIVE December 10, 2020 1257   Sepsis Labs: @LABRCNTIP (procalcitonin:4,lacticidven:4) ) No results found for this or any previous visit (from the past 240 hour(s)).    Radiology Studies: No results found.  Scheduled Meds: . aspirin EC  81 mg Oral Daily  . atorvastatin  40 mg Oral Daily  . Chlorhexidine Gluconate Cloth  6 each Topical Daily  . collagenase   Topical Daily  . feeding supplement  237 mL Oral BID BM  . folic acid  1 mg Oral Daily  . insulin aspart  0-9 Units Subcutaneous TID WC  . multivitamin with minerals  1 tablet Oral Daily  . nutrition supplement (JUVEN)  1 packet Oral BID BM  . nystatin  5 mL Oral QID  . pantoprazole  40 mg Oral Daily  . thiamine  100 mg Oral Daily   Continuous Infusions:   LOS: 17 days      , MD [facility] Pager: [email or web address] 12/22/2020, 1:11  PM

## 2020-12-22 NOTE — Plan of Care (Signed)
  Problem: Health Behavior/Discharge Planning: Goal: Ability to manage health-related needs will improve Outcome: Progressing   Problem: Health Behavior/Discharge Planning: Goal: Ability to manage health-related needs will improve Outcome: Progressing   Problem: Health Behavior/Discharge Planning: Goal: Ability to manage health-related needs will improve Outcome: Progressing   Problem: Clinical Measurements: Goal: Ability to maintain clinical measurements within normal limits will improve Outcome: Progressing Goal: Will remain free from infection Outcome: Progressing   Problem: Clinical Measurements: Goal: Ability to maintain clinical measurements within normal limits will improve Outcome: Progressing   Problem: Clinical Measurements: Goal: Will remain free from infection Outcome: Progressing   Problem: Health Behavior/Discharge Planning: Goal: Ability to manage health-related needs will improve Outcome: Progressing   Problem: Clinical Measurements: Goal: Ability to maintain clinical measurements within normal limits will improve Outcome: Progressing   Problem: Clinical Measurements: Goal: Will remain free from infection Outcome: Progressing

## 2020-12-23 LAB — GLUCOSE, CAPILLARY
Glucose-Capillary: 124 mg/dL — ABNORMAL HIGH (ref 70–99)
Glucose-Capillary: 121 mg/dL — ABNORMAL HIGH (ref 70–99)
Glucose-Capillary: 129 mg/dL — ABNORMAL HIGH (ref 70–99)
Glucose-Capillary: 104 mg/dL — ABNORMAL HIGH (ref 70–99)
Glucose-Capillary: 90 mg/dL (ref 70–99)

## 2020-12-23 MED ORDER — MORPHINE SULFATE (PF) 2 MG/ML IV SOLN
2.0000 mg | INTRAVENOUS | Status: DC | PRN
Start: 2020-12-23 — End: 2020-12-24
  Administered 2020-12-23 – 2020-12-24 (×2): 2 mg via INTRAVENOUS
  Filled 2020-12-23 (×2): qty 1

## 2020-12-23 NOTE — Progress Notes (Addendum)
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 In the emergency room, responded to IV fluid hydration. Stool tested positive for C. difficile, treated with oral vancomycin.     On 12/07/2020 patient was noted to be more weak on both sides. Stat MRI head was negative for acute stroke. On 12/08/2020, patient continued to have persistent quadriparesis. MRI of the cervical spine was thought that showed severe cervical stenosis and spinal cord edema. Started on a steroids. Discussed with neurosurgery and initiated transfer to [facility] for possible C-spine decompression. He was seen by neurosurgery on 12/12/20, but thought to be too high risk for surgery given the risks of perioperative morbidity and possible mortality. Plan for a steroid taper over 7days, therapy evaluation as well as palliative care.    Subjective: Frail-appearing male, lying in bed.  Denies having any major complaints at this time.  He says diarrhea is improving.    Assessment & Plan:   Principal Problem:   C. difficile colitis - resolved after treatment with Vancomycin from 12/28-1/7- he is stable   Active Problems:   Cervical spinal stenosis- severe at C3-C5 - completed Decadron taper - per NS, he is too high rick for decompression surgery - he is able to stand but had trouble remaining fully upright - awaiting SNF bed - appreciate palliative care involvement   Hyperglycemia and leukocytosis - due to steroids- improving as steroids being  weaned  Acute thrombocytopenia - follow - he is not on any heparin products- platelets a little low but stable - etiology uncertain   Normocytic anemia- h/o gastric AVMs - likely AOCD - s/o 2 U PRBC on 12/28 -Monitor  Sinus bradycardia - no indication for PPM per cardiology - avoid nodal blocker  Acute metabolic encephalopathy - apparently having intermittent confusion-monitor closely  Stage 2 sacral pressure ulcer Pressure Injury 12/06/20 Sacrum Medial Stage 2 -  Partial thickness loss of dermis presenting as a shallow open injury with a red, pink wound bed without slough. (Active)  12/06/20 1700  Location: Sacrum  Location Orientation: Medial  Staging: Stage 2 -  Partial thickness loss of dermis presenting as a shallow open injury with a red, pink wound bed without slough.  Wound Description (Comments):   Present on Admission: Yes     Time spent in minutes: 25 DVT prophylaxis: SCDs Start: 11/21/2020 1913 Code Status: DNR Family Communication: No family at bedside.  Unable to reach. Disposition Plan: Awaiting SNF placement. Status is: Inpatient  Remains inpatient appropriate because:awaiting SNF bed   Dispo: The patient is from: Home              Anticipated d/c is to: SNF              Anticipated d/c date is: > 3 days              Patient currently is medically stable to d/c.  Consultants:   NS  Cardiology  Palliative care Procedures:     Antimicrobials:  Anti-infectives (From admission, onward)   Start     Dose/Rate Route Frequency  Ordered Stop   12/06/20 1830  vancomycin (VANCOCIN) 50 mg/mL oral solution 125 mg        125 mg Oral 4 times daily 12/06/20 1738 12/16/20 1759   11/11/2020 1300  cefTRIAXone (ROCEPHIN) 1 g in sodium chloride 0.9 % 100 mL IVPB  Status:  Discontinued        1 g 200 mL/hr over 30 Minutes Intravenous Every 24 hours 11/12/2020 1259 11/30/2020 2218       Objective: Vitals:   12/23/20 0026 12/23/20 0357 12/23/20 0500 12/23/20 0802   BP: 114/66 95/61  95/76  Pulse: (!) 104 100  100  Resp: 18   18  Temp: 97.9 F (36.6 C) 97.7 F (36.5 C)  97.6 F (36.4 C)  TempSrc: Oral   Oral  SpO2: 98% 98%  99%  Weight:   48.5 kg   Height:        Intake/Output Summary (Last 24 hours) at 12/23/2020 0942 Last data filed at 12/23/2020 0400 Gross per 24 hour  Intake 720 ml  Output 2750 ml  Net -2030 ml   Filed Weights   12/20/20 0500 12/21/20 0300 12/23/20 0500  Weight: 52.9 kg 52.9 kg 48.5 kg    Examination: General exam:  Frail-appearing male, lying in bed, appears comfortable  HEENT: PERRLA, oral mucosa moist, no sclera icterus or thrush Respiratory system: Clear to auscultation. Respiratory effort normal. Cardiovascular system: S1 & S2 heard,  No murmurs  Gastrointestinal system: Abdomen soft, non-tender, nondistended. Normal bowel sounds   Central nervous system: Alert and oriented.  Debility with generalized weakness, worse in lower extremities Extremities: No cyanosis, clubbing or edema Skin: No rashes or ulcers Psychiatry:  Mood & affect appropriate.    Data Reviewed: I have personally reviewed following labs and imaging studies  CBC: Recent Labs  Lab 12/20/20 0210 12/21/20 0340 12/22/20 0317  WBC 20.8* 17.4* 14.6*  NEUTROABS  --  15.5*  --   HGB 9.9* 9.2* 9.7*  HCT 30.5* 29.7* 30.8*  MCV 90.8 93.4 91.7  PLT 146* 136* 138*   Basic Metabolic Panel: Recent Labs  Lab 12/19/20 0800 12/21/20 0340  NA 138 135  K 4.0 4.5  CL 104 103  CO2 27 22  GLUCOSE 110* 211*  BUN 18 17  CREATININE 0.51* 0.59*  CALCIUM 8.6* 8.6*   GFR: Estimated Creatinine Clearance: 59.8 mL/min (A) (by C-G formula based on SCr of 0.59 mg/dL (L)). Liver Function Tests: No results for input(s): AST, ALT, ALKPHOS, BILITOT, PROT, ALBUMIN in the last 168 hours. No results for input(s): LIPASE, AMYLASE in the last 168 hours. No results for input(s): AMMONIA in the last 168 hours. Coagulation Profile: No results for input(s):  INR, PROTIME in the last 168 hours. Cardiac Enzymes: No results for input(s): CKTOTAL, CKMB, CKMBINDEX, TROPONINI in the last 168 hours. BNP (last 3 results) No results for input(s): PROBNP in the last 8760 hours. HbA1C: No results for input(s): HGBA1C in the last 72 hours. CBG: Recent Labs  Lab 12/22/20 0604 12/22/20 1146 12/22/20 1652 12/22/20 2227 12/23/20 0633  GLUCAP 174* 136* 144* 207* 121*   Lipid Profile: No results for input(s): CHOL, HDL, LDLCALC, TRIG, CHOLHDL, LDLDIRECT in the last 72 hours. Thyroid Function Tests: No results for input(s): TSH, T4TOTAL, FREET4, T3FREE, THYROIDAB in the last 72 hours. Anemia Panel: No results for input(s): VITAMINB12, FOLATE, FERRITIN, TIBC, IRON, RETICCTPCT in the last 72 hours. Urine analysis:    Component Value Date/Time   COLORURINE YELLOW 11/25/2020 1257  APPEARANCEUR CLEAR 2020/12/27 1257   LABSPEC 1.010 2020-12-27 1257   PHURINE 6.0 2020/12/27 1257   GLUCOSEU NEGATIVE 27-Dec-2020 1257   HGBUR NEGATIVE 12/27/20 1257   BILIRUBINUR NEGATIVE 12-27-20 1257   KETONESUR 5 (A) 12-27-20 1257   PROTEINUR NEGATIVE Dec 27, 2020 1257   NITRITE NEGATIVE 27-Dec-2020 1257   LEUKOCYTESUR NEGATIVE 12-27-2020 1257   Sepsis Labs: @LABRCNTIP (procalcitonin:4,lacticidven:4) ) No results found for this or any previous visit (from the past 240 hour(s)).    Radiology Studies: No results found.  Scheduled Meds: . aspirin EC  81 mg Oral Daily  . atorvastatin  40 mg Oral Daily  . Chlorhexidine Gluconate Cloth  6 each Topical Daily  . collagenase   Topical Daily  . feeding supplement  237 mL Oral BID BM  . folic acid  1 mg Oral Daily  . insulin aspart  0-9 Units Subcutaneous TID WC  . multivitamin with minerals  1 tablet Oral Daily  . nutrition supplement (JUVEN)  1 packet Oral BID BM  . nystatin  5 mL Oral QID  . pantoprazole  40 mg Oral Daily  . thiamine  100 mg Oral Daily   Continuous Infusions:   LOS: 18 days    , MD [facility] Pager:on [email or web address] 12/23/2020, 9:42 AM

## 2020-12-24 ENCOUNTER — Inpatient Hospital Stay (HOSPITAL_COMMUNITY): Payer: Medicare Other

## 2020-12-24 ENCOUNTER — Inpatient Hospital Stay: Payer: Self-pay

## 2020-12-24 HISTORY — PX: IR PERC CHOLECYSTOSTOMY: IMG2326

## 2020-12-24 LAB — HEPATIC FUNCTION PANEL
ALT: 195 U/L — ABNORMAL HIGH (ref 0–44)
AST: 357 U/L — ABNORMAL HIGH (ref 15–41)
Albumin: 1.9 g/dL — ABNORMAL LOW (ref 3.5–5.0)
Alkaline Phosphatase: 429 U/L — ABNORMAL HIGH (ref 38–126)
Bilirubin, Direct: 1 mg/dL — ABNORMAL HIGH (ref 0.0–0.2)
Indirect Bilirubin: 0.8 mg/dL (ref 0.3–0.9)
Total Bilirubin: 1.8 mg/dL — ABNORMAL HIGH (ref 0.3–1.2)
Total Protein: 5.2 g/dL — ABNORMAL LOW (ref 6.5–8.1)

## 2020-12-24 LAB — COMPREHENSIVE METABOLIC PANEL WITH GFR
ALT: 191 U/L — ABNORMAL HIGH (ref 0–44)
AST: 353 U/L — ABNORMAL HIGH (ref 15–41)
Albumin: 1.7 g/dL — ABNORMAL LOW (ref 3.5–5.0)
Alkaline Phosphatase: 334 U/L — ABNORMAL HIGH (ref 38–126)
Anion gap: 20 — ABNORMAL HIGH (ref 5–15)
BUN: 39 mg/dL — ABNORMAL HIGH (ref 8–23)
CO2: 16 mmol/L — ABNORMAL LOW (ref 22–32)
Calcium: 8.3 mg/dL — ABNORMAL LOW (ref 8.9–10.3)
Chloride: 98 mmol/L (ref 98–111)
Creatinine, Ser: 1.39 mg/dL — ABNORMAL HIGH (ref 0.61–1.24)
GFR, Estimated: 55 mL/min — ABNORMAL LOW
Glucose, Bld: 59 mg/dL — ABNORMAL LOW (ref 70–99)
Potassium: 4.3 mmol/L (ref 3.5–5.1)
Sodium: 134 mmol/L — ABNORMAL LOW (ref 135–145)
Total Bilirubin: 1.8 mg/dL — ABNORMAL HIGH (ref 0.3–1.2)
Total Protein: 4.8 g/dL — ABNORMAL LOW (ref 6.5–8.1)

## 2020-12-24 LAB — PROTIME-INR
INR: 1.5 — ABNORMAL HIGH (ref 0.8–1.2)
Prothrombin Time: 17.3 seconds — ABNORMAL HIGH (ref 11.4–15.2)

## 2020-12-24 LAB — GLUCOSE, CAPILLARY
Glucose-Capillary: 117 mg/dL — ABNORMAL HIGH (ref 70–99)
Glucose-Capillary: 132 mg/dL — ABNORMAL HIGH (ref 70–99)
Glucose-Capillary: 61 mg/dL — ABNORMAL LOW (ref 70–99)
Glucose-Capillary: 79 mg/dL (ref 70–99)
Glucose-Capillary: 79 mg/dL (ref 70–99)
Glucose-Capillary: 83 mg/dL (ref 70–99)
Glucose-Capillary: 91 mg/dL (ref 70–99)
Glucose-Capillary: 98 mg/dL (ref 70–99)
Glucose-Capillary: 98 mg/dL (ref 70–99)

## 2020-12-24 LAB — BASIC METABOLIC PANEL
Anion gap: 18 — ABNORMAL HIGH (ref 5–15)
BUN: 39 mg/dL — ABNORMAL HIGH (ref 8–23)
CO2: 16 mmol/L — ABNORMAL LOW (ref 22–32)
Calcium: 8.9 mg/dL (ref 8.9–10.3)
Chloride: 99 mmol/L (ref 98–111)
Creatinine, Ser: 1.41 mg/dL — ABNORMAL HIGH (ref 0.61–1.24)
GFR, Estimated: 54 mL/min — ABNORMAL LOW (ref >60)
Glucose, Bld: 78 mg/dL (ref 70–99)
Potassium: 4.8 mmol/L (ref 3.5–5.1)
Sodium: 133 mmol/L — ABNORMAL LOW (ref 135–145)

## 2020-12-24 LAB — PHOSPHORUS: Phosphorus: 5 mg/dL — ABNORMAL HIGH (ref 2.5–4.6)

## 2020-12-24 LAB — LACTIC ACID, PLASMA
Lactic Acid, Venous: 8.3 mmol/L (ref 0.5–1.9)
Lactic Acid, Venous: 7 mmol/L (ref 0.5–1.9)

## 2020-12-24 LAB — CORTISOL: Cortisol, Plasma: 20.8 ug/dL

## 2020-12-24 LAB — APTT: aPTT: 44 seconds — ABNORMAL HIGH (ref 24–36)

## 2020-12-24 LAB — MAGNESIUM: Magnesium: 1.6 mg/dL — ABNORMAL LOW (ref 1.7–2.4)

## 2020-12-24 LAB — PROCALCITONIN: Procalcitonin: 53.52 ng/mL

## 2020-12-24 IMAGING — US IR CHOLECYSTOSTOMY
2 series · 9 of 9 positions shown · non-contrast
Comparison: none

INDICATION: 69-year-old with sepsis and acute calculus cholecystitis. Patient
needs source control for sepsis management.

[Series 1: body 4 care · 2 acquisitions, 3 frames shown]
[im 1/2]
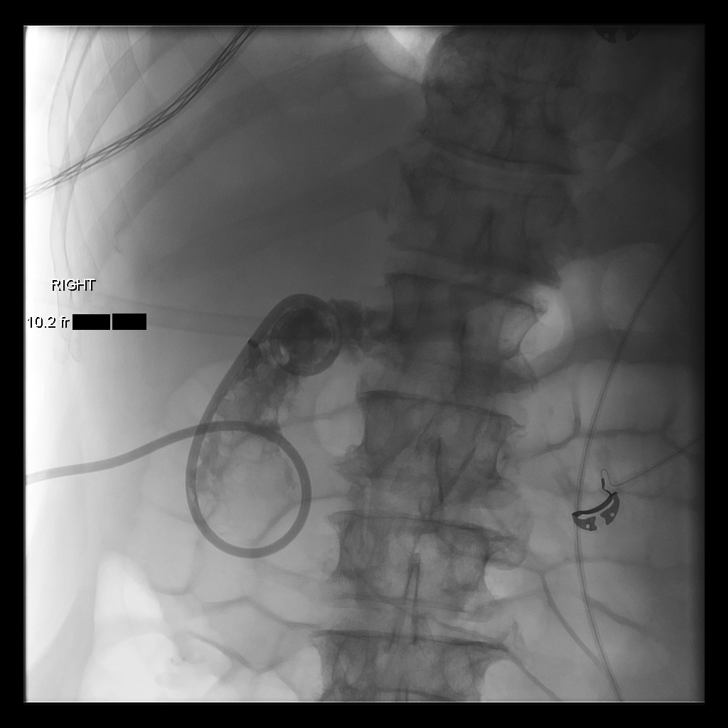
[im 1/2]
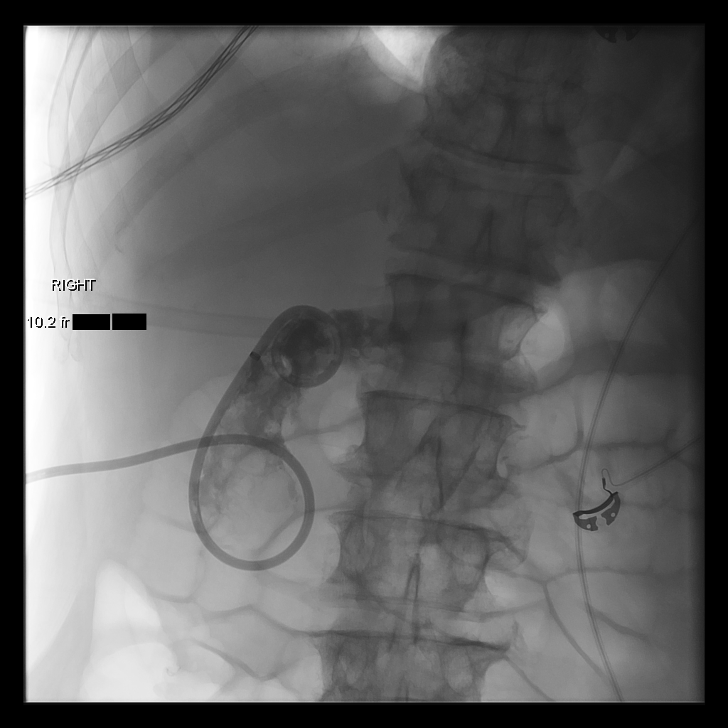
[im 2/2]
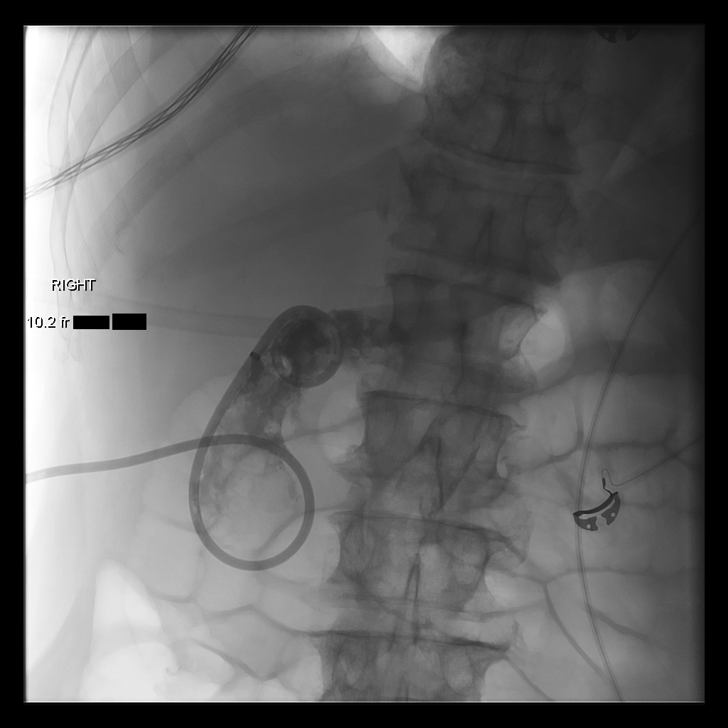

[Series 1: ir cholecystostomy · 6 of 6 slices shown]
[im 1/6]
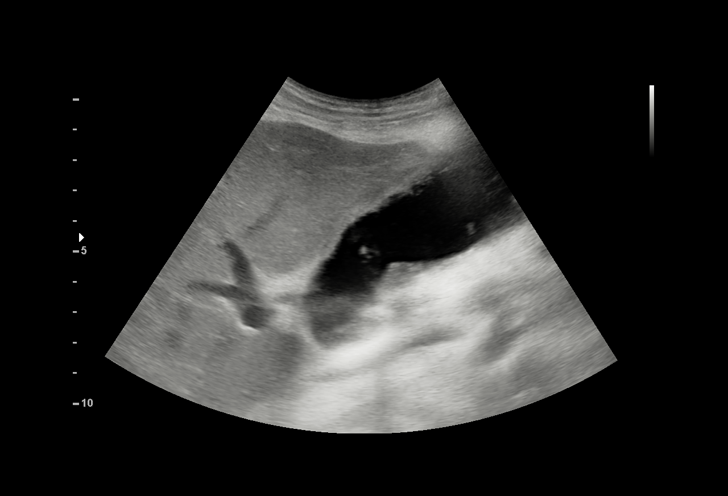
[im 2/6]
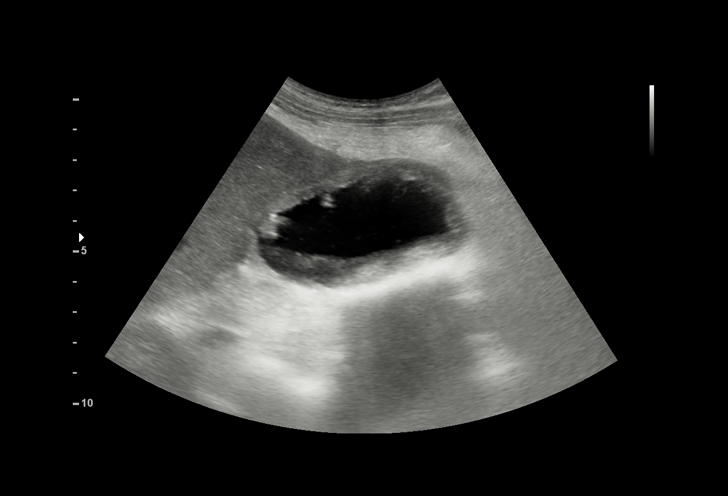
[im 3/6]
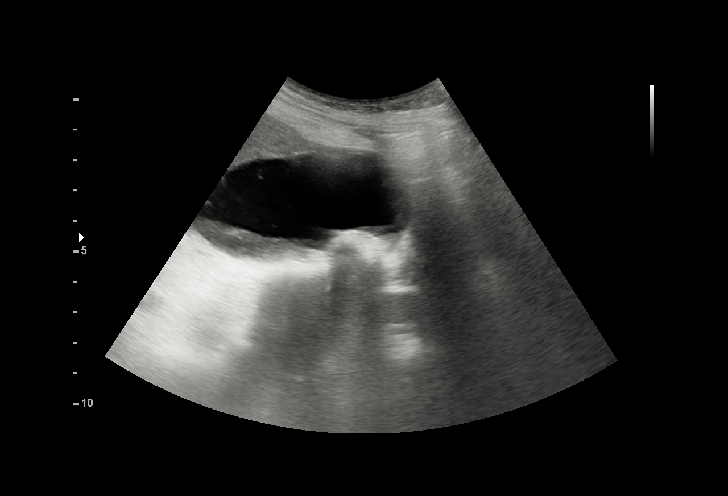
[im 4/6]
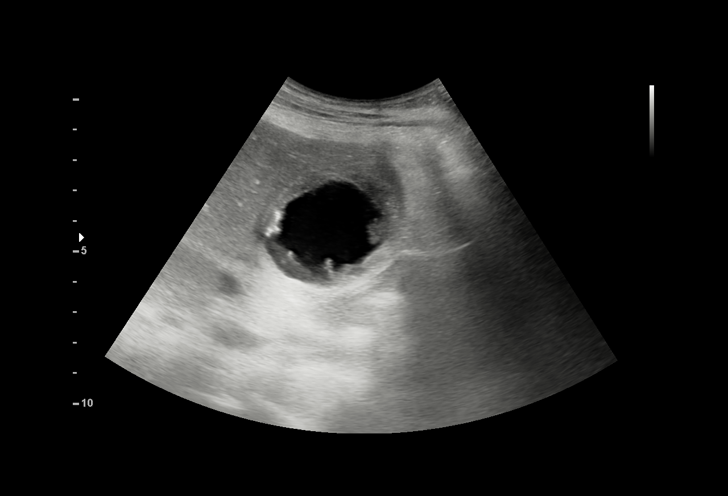
[im 5/6]
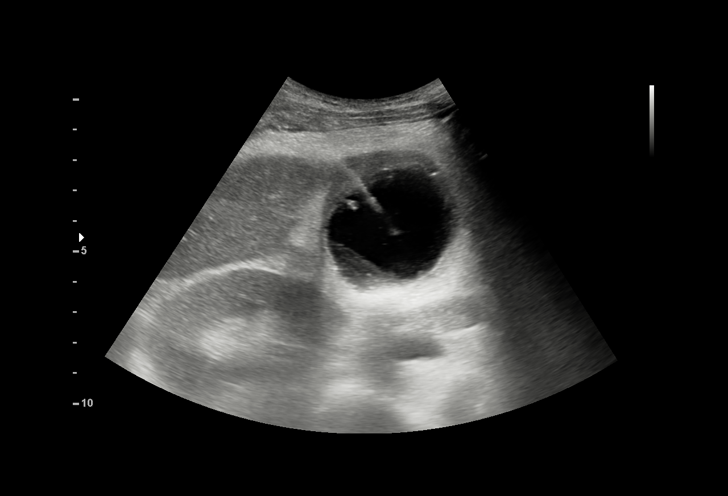
[im 6/6]
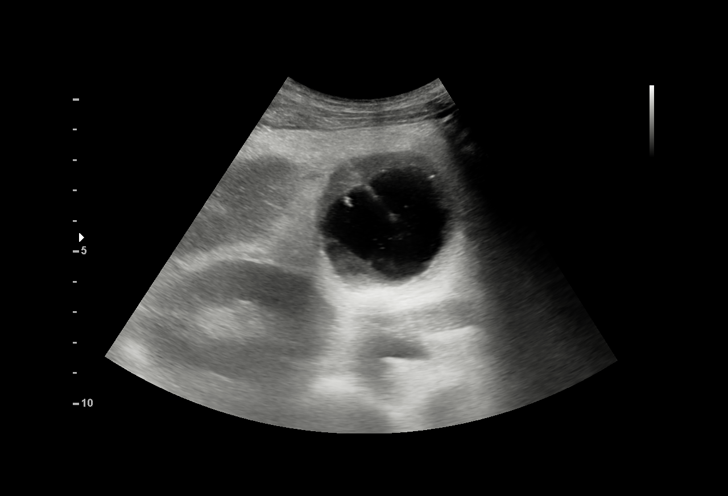

[9 of 9 positions shown; findings below may reference images not displayed]

EXAM:
PERCUTANEOUS CHOLECYSTOSTOMY TUBE PLACEMENT WITH ULTRASOUND AND
FLUOROSCOPIC GUIDANCE

MEDICATIONS:
Patient was already receiving inpatient antibiotics and no
additional antibiotics were given for the procedure.

ANESTHESIA/SEDATION:
Moderate (conscious) sedation was employed during this procedure. A
total of Versed 0.0 mg and Fentanyl 50 mcg was administered
intravenously.

Moderate Sedation Time: 11 minutes. The patient's level of
consciousness and vital signs were monitored continuously by
radiology nursing throughout the procedure under my direct
supervision.

FLUOROSCOPY TIME:  Fluoroscopy Time: 54 seconds, 3 mGy

COMPLICATIONS:
None immediate.

PROCEDURE:
Informed telephone consent was obtained from the patient's son after
a thorough discussion of the procedural risks, benefits and
alternatives. All questions were addressed. A timeout was performed
prior to the initiation of the procedure.

Patient was placed supine. The right abdomen was prepped and draped
in sterile fashion. Maximal barrier sterile technique was utilized
including caps, mask, sterile gowns, sterile gloves, sterile drape,
hand hygiene and skin antiseptic. Skin was anesthetized using 1%
lidocaine. Using ultrasound guidance, a 21 gauge needle was directed
into the distended gallbladder. A trans peritoneal approach was
selected rather than a transhepatic approach based on the
gallbladder position. 0.018 wire was placed. A transitional dilator
set was placed. Superstiff Amplatz wire was advanced into the
gallbladder and the tract was dilated to accommodate a 10 French
multipurpose drain. Approximately 80 mL of brown fluid was
aspirated. The gallbladder was decompressed. Drain was flushed with
saline and attached to a gravity bag. Fluid sample was sent for
culture. Catheter was sutured to skin and a dressing was placed.
FINDINGS: The gallbladder was markedly distended with thick wall and shadowing
gallstone. Drain was successfully placed in the gallbladder. 80 mL
of brown fluid was removed. Gallbladder was decompressed based on
ultrasound at the end of the procedure.
IMPRESSION: Successful percutaneous cholecystostomy tube placement with
ultrasound and fluoroscopic guidance.

## 2020-12-24 IMAGING — DX DG CHEST 1V PORT
1 series · 1 of 1 positions shown · non-contrast
Comparison: [DATE].

CLINICAL DATA: Leukocytosis.

EXAM:
PORTABLE CHEST 1 VIEW

[chest ap]
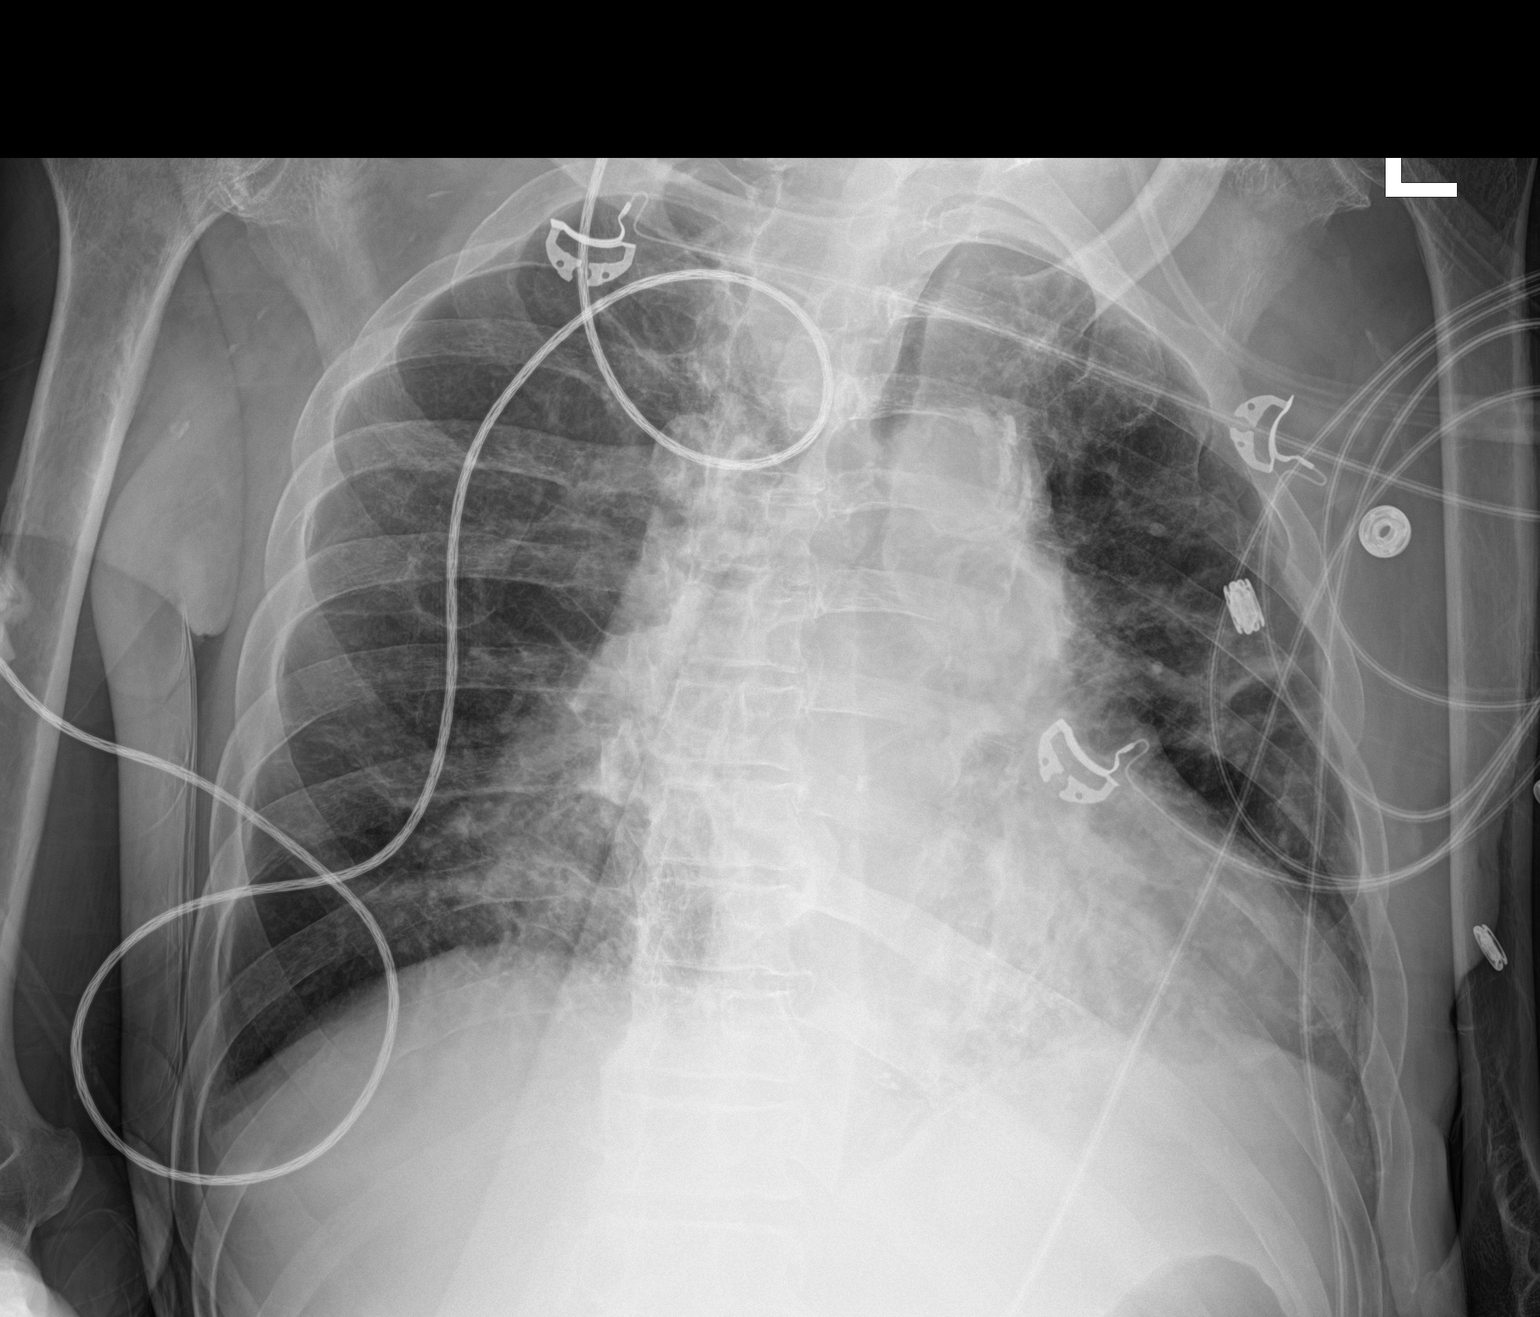

[1 of 1 positions shown; findings below may reference images not displayed]

FINDINGS: There is airspace opacity in the left lung base, retrocardiac
region, new since the prior study. There is from a linear type
opacity in the left mid lung similar to the prior exam consistent
with scarring. Milder opacity is noted at the medial right lung
base, not convincingly changed from the prior exam, allowing for the
patient's current positioning, rotated to the left. Remainder of the
lungs is clear.

No convincing pleural effusion and no pneumothorax.

Cardiac silhouette normal in size.  No mediastinal or hilar masses.

Skeletal structures are demineralized but grossly intact.
IMPRESSION: 1. Airspace opacity at the left lung base concerning for pneumonia.
2. No other evidence of acute cardiopulmonary disease.

## 2020-12-24 IMAGING — CT CT ABD-PELV W/O CM
2 of 4 series · 15 of 46 positions shown, 17 images · non-contrast
Comparison: None.

CLINICAL DATA: Sepsis.

EXAM:
CT ABDOMEN AND PELVIS WITHOUT CONTRAST
TECHNIQUE: Multidetector CT imaging of the abdomen and pelvis was performed
following the standard protocol without IV contrast.

[Series 3: ap without · axial · non-contrast · 0.67mm/px · z∈[+819,+1204]mm · 12 of 87 slices shown, 14 images]
[im 5/87  soft-tissue]
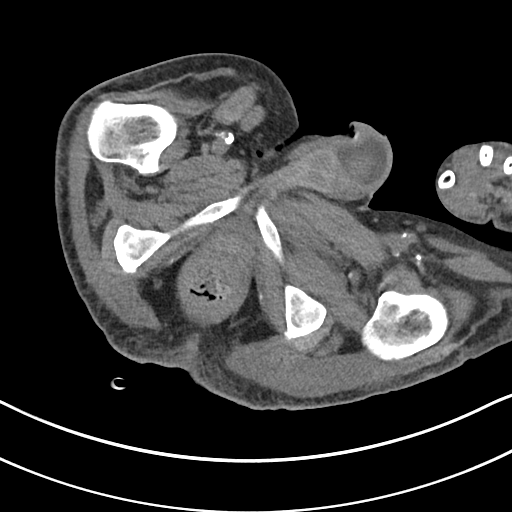
[im 5/87  bone]
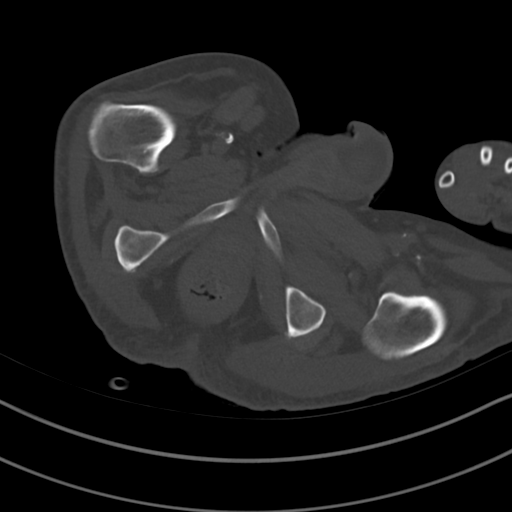
[im 14/87  soft-tissue]
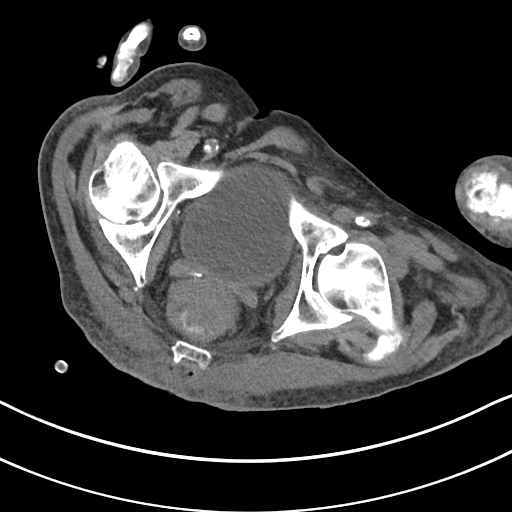
[im 19/87  soft-tissue]
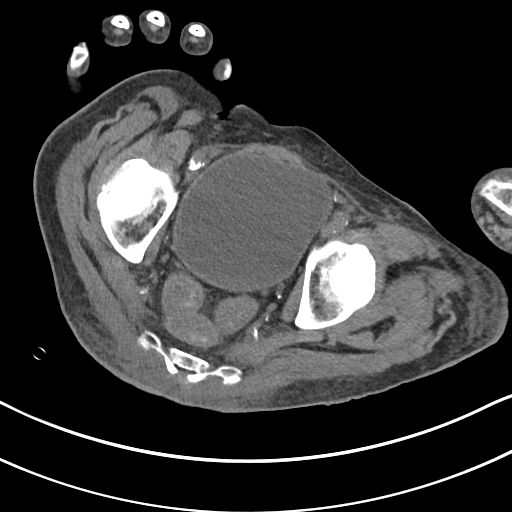
[im 28/87  soft-tissue]
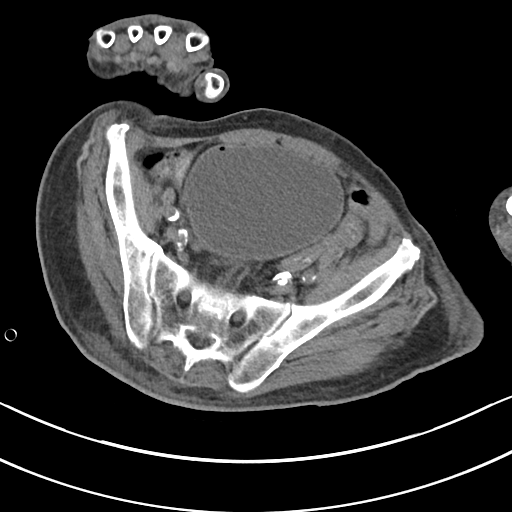
[im 32/87  soft-tissue]
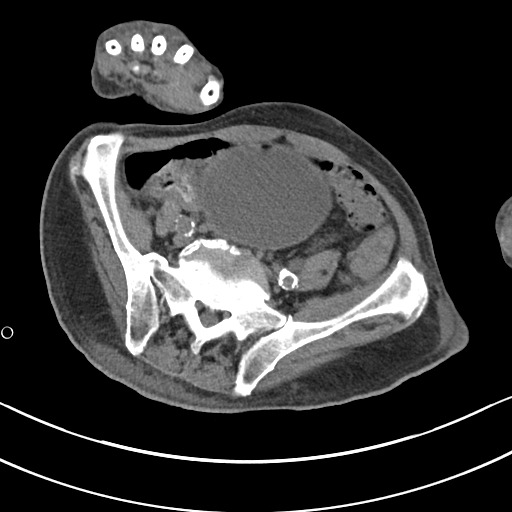
[im 41/87  soft-tissue]
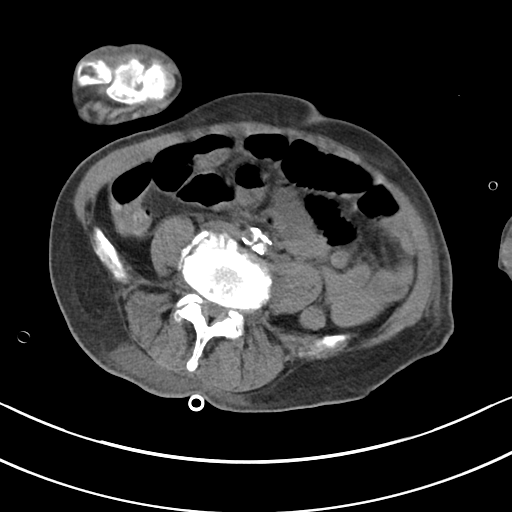
[im 46/87  soft-tissue]
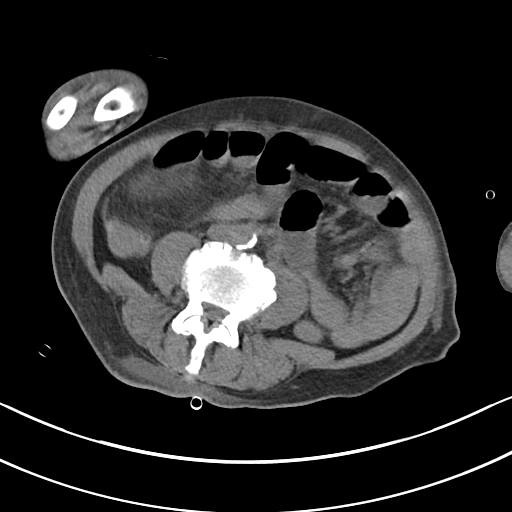
[im 55/87  soft-tissue]
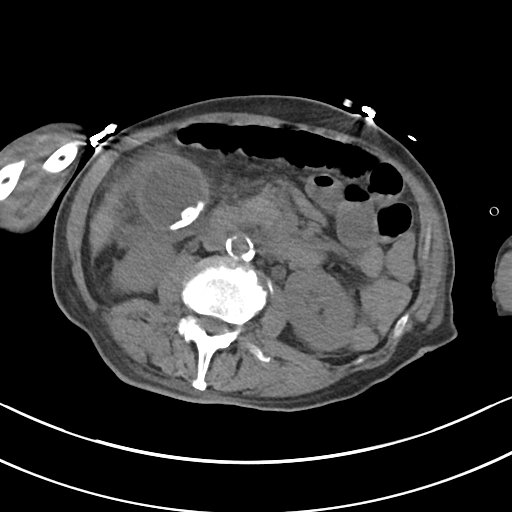
[im 59/87  soft-tissue]
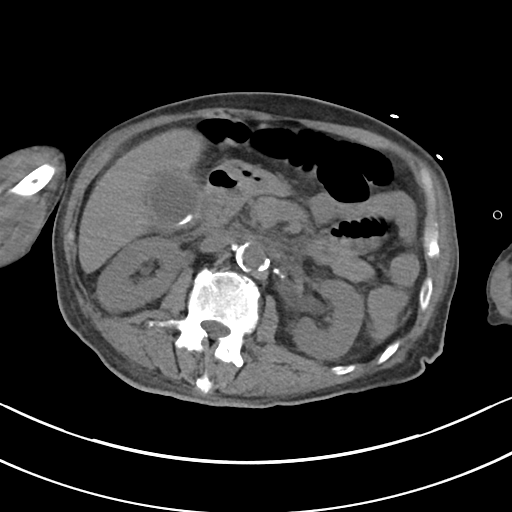
[im 59/87  bone]
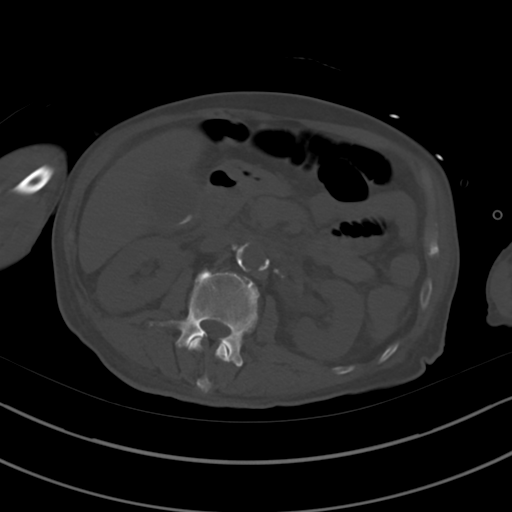
[im 68/87  soft-tissue]
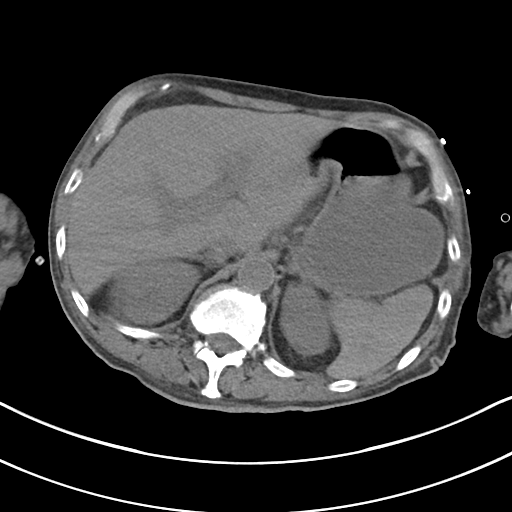
[im 73/87  soft-tissue]
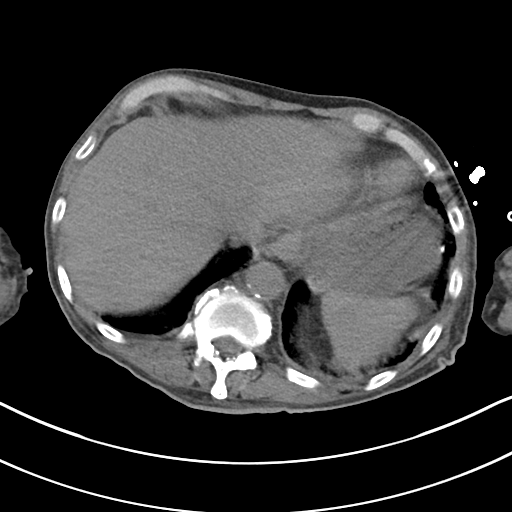
[im 82/87  soft-tissue]
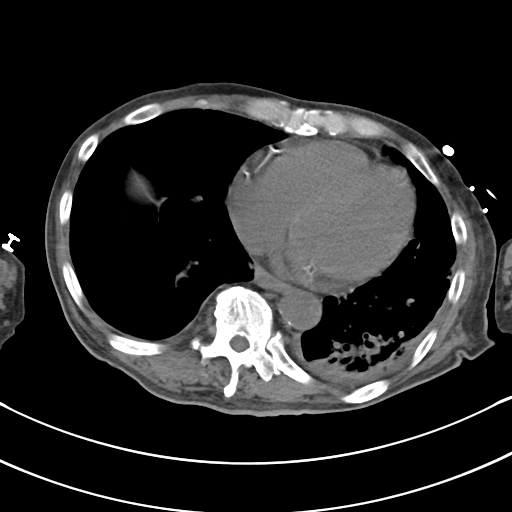

[Series 6: cor · coronal · 0.59mm/px · 3 of 85 slices shown]
[im 29/85  soft-tissue]
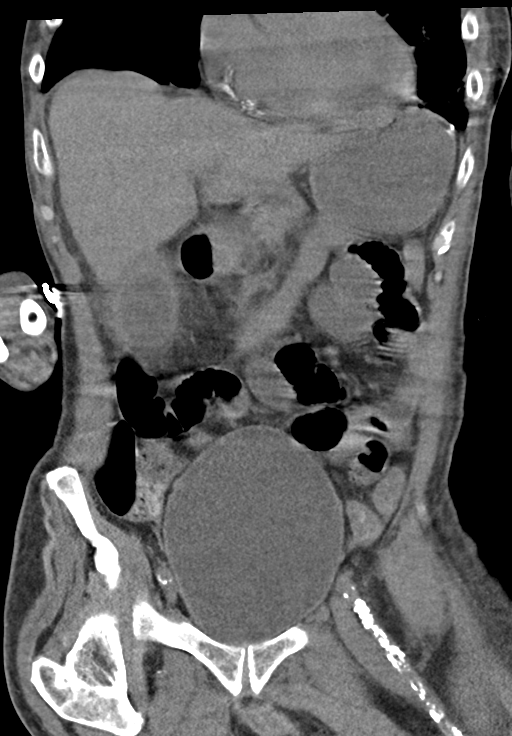
[im 38/85  soft-tissue]
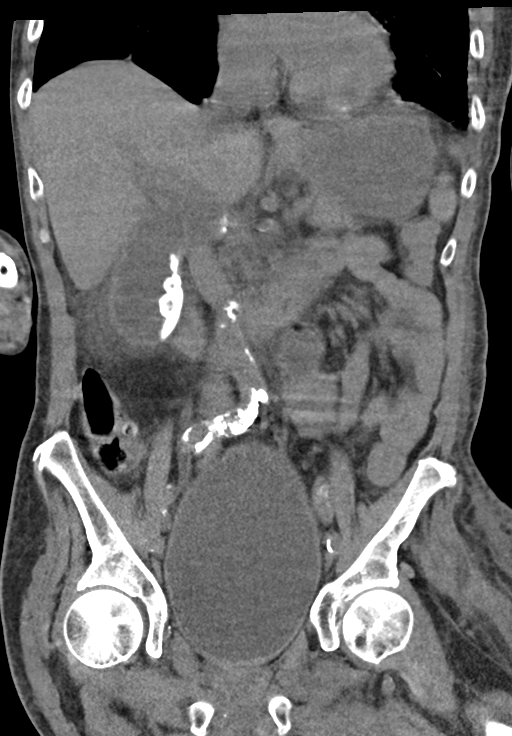
[im 47/85  soft-tissue]
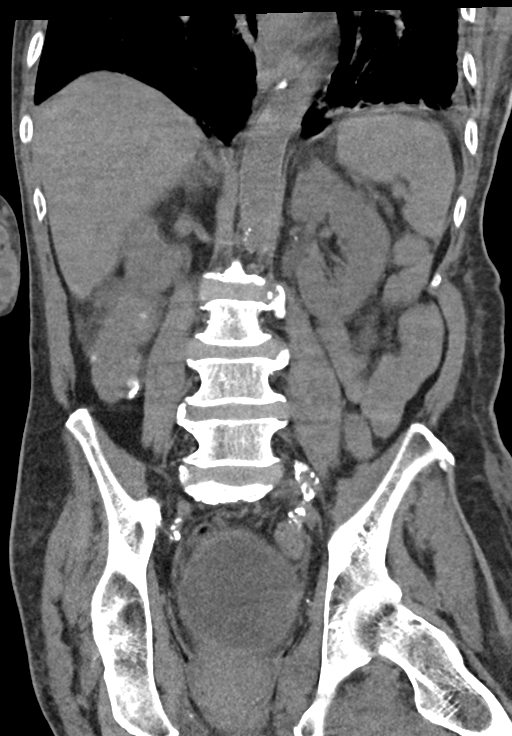

[15 of 46 positions shown; findings below may reference images not displayed]

FINDINGS: Lower chest: Mild left basilar opacity is noted concerning for
atelectasis or possibly infiltrate.

Hepatobiliary: Gallbladder is distended with cholelithiasis.
Significant amount of inflammatory changes noted around the
gallbladder concerning for cholecystitis. No biliary dilatation is
noted. The liver is unremarkable.

Pancreas: Unremarkable. No pancreatic ductal dilatation or
surrounding inflammatory changes.

Spleen: Normal in size without focal abnormality.

Adrenals/Urinary Tract: Adrenal glands and kidneys appear normal. No
hydronephrosis or renal obstruction is noted. No renal or ureteral
calculi are noted. Mild urinary bladder distention is noted.

Stomach/Bowel: The stomach appears normal. There is no evidence of
bowel obstruction or inflammation. The appendix is not clearly
visualized.

Vascular/Lymphatic: Aortic atherosclerosis. No enlarged abdominal or
pelvic lymph nodes.

Reproductive: Prostate is unremarkable.

Other: No abdominal wall hernia or abnormality. No abdominopelvic
ascites.

Musculoskeletal: No acute or significant osseous findings.
IMPRESSION: 1. Gallbladder distention is noted with cholelithiasis and
surrounding inflammatory changes, consistent with acute
cholecystitis. Ultrasound may be performed for further evaluation.
These results will be called to the ordering clinician or
representative by the Radiologist Assistant, and communication
documented in the PACS or zVision Dashboard.
2. Mild left basilar opacity is noted concerning for atelectasis or
possibly infiltrate.
3. Mild urinary bladder distention is noted.

Aortic Atherosclerosis ([4H]-[4H]).

## 2020-12-24 MED ORDER — MIDAZOLAM HCL 2 MG/2ML IJ SOLN
INTRAMUSCULAR | Status: AC
  Filled 2020-12-24: qty 2

## 2020-12-24 MED ORDER — VANCOMYCIN HCL IN DEXTROSE 1-5 GM/200ML-% IV SOLN
1000.0000 mg | Freq: Once | INTRAVENOUS | Status: AC
  Administered 2020-12-24: 1000 mg via INTRAVENOUS
  Filled 2020-12-24: qty 200

## 2020-12-24 MED ORDER — IOHEXOL 300 MG/ML  SOLN
50.0000 mL | Freq: Once | INTRAMUSCULAR | Status: AC | PRN
  Administered 2020-12-24: 10 mL

## 2020-12-24 MED ORDER — SODIUM BICARBONATE 8.4 % IV SOLN
100.0000 meq | Freq: Once | INTRAVENOUS | Status: AC
  Administered 2020-12-24: 100 meq via INTRAVENOUS
  Filled 2020-12-24: qty 50

## 2020-12-24 MED ORDER — MIDAZOLAM HCL 2 MG/2ML IJ SOLN
INTRAMUSCULAR | Status: AC | PRN
  Administered 2020-12-24 (×2): 0.5 mg via INTRAVENOUS

## 2020-12-24 MED ORDER — DEXTROSE 50 % IV SOLN
INTRAVENOUS | Status: AC
  Administered 2020-12-24: 12.5 g via INTRAVENOUS
  Filled 2020-12-24: qty 50

## 2020-12-24 MED ORDER — METRONIDAZOLE IN NACL 5-0.79 MG/ML-% IV SOLN
500.0000 mg | Freq: Three times a day (TID) | INTRAVENOUS | Status: DC
  Administered 2020-12-24 – 2020-12-25 (×4): 500 mg via INTRAVENOUS
  Filled 2020-12-24 (×4): qty 100

## 2020-12-24 MED ORDER — DEXTROSE 50 % IV SOLN
INTRAVENOUS | Status: AC
  Filled 2020-12-24: qty 50

## 2020-12-24 MED ORDER — FENTANYL CITRATE (PF) 100 MCG/2ML IJ SOLN
INTRAMUSCULAR | Status: AC
  Filled 2020-12-24: qty 2

## 2020-12-24 MED ORDER — SODIUM CHLORIDE 0.9 % IV SOLN
2.0000 g | Freq: Two times a day (BID) | INTRAVENOUS | Status: DC
  Administered 2020-12-24 (×2): 2 g via INTRAVENOUS
  Filled 2020-12-24 (×2): qty 2

## 2020-12-24 MED ORDER — NOREPINEPHRINE 4 MG/250ML-% IV SOLN
0.0000 ug/min | INTRAVENOUS | Status: DC
  Administered 2020-12-24: 12 ug/min via INTRAVENOUS
  Administered 2020-12-24: 2 ug/min via INTRAVENOUS
  Administered 2020-12-24: 11 ug/min via INTRAVENOUS
  Administered 2020-12-25: 4 ug/min via INTRAVENOUS
  Administered 2020-12-25 (×2): 12 ug/min via INTRAVENOUS
  Filled 2020-12-24 (×6): qty 250

## 2020-12-24 MED ORDER — VANCOMYCIN HCL IN DEXTROSE 1-5 GM/200ML-% IV SOLN: 1000.0000 mg | INTRAVENOUS | Status: DC

## 2020-12-24 MED ORDER — DEXTROSE 50 % IV SOLN: 12.5000 g | INTRAVENOUS | Status: AC

## 2020-12-24 MED ORDER — LACTATED RINGERS IV BOLUS
500.0000 mL | Freq: Once | INTRAVENOUS | Status: AC
  Administered 2020-12-24: 500 mL via INTRAVENOUS

## 2020-12-24 MED ORDER — LACTATED RINGERS IV BOLUS (SEPSIS)
1000.0000 mL | Freq: Once | INTRAVENOUS | Status: AC
  Administered 2020-12-24: 1000 mL via INTRAVENOUS

## 2020-12-24 MED ORDER — MORPHINE SULFATE (PF) 2 MG/ML IV SOLN
2.0000 mg | Freq: Once | INTRAVENOUS | Status: AC
  Administered 2020-12-24: 2 mg via INTRAVENOUS
  Filled 2020-12-24: qty 1

## 2020-12-24 MED ORDER — ORAL CARE MOUTH RINSE
15.0000 mL | Freq: Two times a day (BID) | OROMUCOSAL | Status: DC
  Administered 2020-12-24 – 2020-12-28 (×8): 15 mL via OROMUCOSAL

## 2020-12-24 MED ORDER — LIDOCAINE HCL 1 % IJ SOLN
INTRAMUSCULAR | Status: AC
  Filled 2020-12-24: qty 20

## 2020-12-24 MED ORDER — FENTANYL CITRATE (PF) 100 MCG/2ML IJ SOLN
25.0000 ug | INTRAMUSCULAR | Status: DC | PRN
  Administered 2020-12-24 – 2020-12-27 (×17): 25 ug via INTRAVENOUS
  Filled 2020-12-24 (×17): qty 2

## 2020-12-24 MED ORDER — SODIUM CHLORIDE 0.9% FLUSH
10.0000 mL | INTRAVENOUS | Status: DC | PRN
Start: 2020-12-24 — End: 2020-12-29

## 2020-12-24 MED ORDER — LACTATED RINGERS IV BOLUS
1000.0000 mL | Freq: Once | INTRAVENOUS | Status: AC
  Administered 2020-12-24: 1000 mL via INTRAVENOUS

## 2020-12-24 MED ORDER — SODIUM CHLORIDE 0.9% FLUSH
5.0000 mL | Freq: Three times a day (TID) | INTRAVENOUS | Status: DC
  Administered 2020-12-24 – 2020-12-28 (×12): 5 mL

## 2020-12-24 MED ORDER — LACTATED RINGERS IV SOLN: INTRAVENOUS | Status: DC

## 2020-12-24 MED ORDER — PANTOPRAZOLE SODIUM 40 MG IV SOLR
40.0000 mg | INTRAVENOUS | Status: DC
  Administered 2020-12-24 – 2020-12-25 (×2): 40 mg via INTRAVENOUS
  Filled 2020-12-24 (×2): qty 40

## 2020-12-24 MED ORDER — SODIUM CHLORIDE 0.9% FLUSH
10.0000 mL | Freq: Two times a day (BID) | INTRAVENOUS | Status: DC
  Administered 2020-12-24: 20 mL
  Administered 2020-12-24 – 2020-12-25 (×2): 10 mL
  Administered 2020-12-25: 20 mL
  Administered 2020-12-26 – 2020-12-28 (×4): 10 mL

## 2020-12-24 MED ORDER — LACTATED RINGERS IV BOLUS (SEPSIS)
500.0000 mL | Freq: Once | INTRAVENOUS | Status: AC
  Administered 2020-12-24: 500 mL via INTRAVENOUS

## 2020-12-24 MED ORDER — FENTANYL CITRATE (PF) 100 MCG/2ML IJ SOLN
INTRAMUSCULAR | Status: AC | PRN
  Administered 2020-12-24 (×2): 25 ug via INTRAVENOUS

## 2020-12-24 MED ORDER — DEXTROSE-NACL 5-0.9 % IV SOLN: INTRAVENOUS | Status: DC

## 2020-12-24 MED ORDER — MAGNESIUM SULFATE 2 GM/50ML IV SOLN
2.0000 g | Freq: Once | INTRAVENOUS | Status: AC
  Administered 2020-12-24: 2 g via INTRAVENOUS
  Filled 2020-12-24: qty 50

## 2020-12-24 MED ORDER — LIDOCAINE HCL (PF) 1 % IJ SOLN
INTRAMUSCULAR | Status: AC | PRN
  Administered 2020-12-24: 10 mL

## 2020-12-24 NOTE — Progress Notes (Signed)
CRITICAL VALUE ALERT  Critical Value: Lactic acid 8.3  Date & Time Notied: 12/24/2020 at 0449  Provider Notified: Dr. Kirke Shaggy notified at [phone]  Orders Received/Actions taken: new orders received at [phone]

## 2020-12-24 NOTE — Progress Notes (Signed)
   Spoke with son, Harvie Heck on the phone.  Team unable to contact him overnight.  Updated him over his father's deterioration overnight and worsening septic shock.   Harvie Heck is getting off work and will head up here.  Attempted further GOC and limitations (as far as vasopressors) attempted over phone, but he seemed overwhelmed; wishes to discuss further when he gets to hospital.  Harvie Heck did confirm his dads wishes of NO CPR or intubation.  Harvie Heck is ok with a central line.  Patient is currently on NE at 8 mcg/min peripherally with very poor venous access.  At this time, given patient continues to be mildly agitated/ restless, unable to follow directions, I do feel like it would be unsafe to proceed with an IJ CVL.  His most recent blood cultures from 12/27 were negative.  New set sent today.  Discussed with Dr. Everardo All and will proceed with PICC for now.       Posey Boyer, ACNP [facility] 12/24/2020, 9:19 AM

## 2020-12-24 NOTE — Progress Notes (Signed)
[facility] night shift.  The patient was seen after the staff reported worsening pain, decreased mentation and possible dehydration.  The patient's vital signs were BP 83/56, pulse 128, respirations 20, O2 sat 94% on room air. He was somnolent, but responded briefly to name. Not oriented to place. Oral mucosa was very dry. Chest mild rhonchi bilaterally, no wheezing, no crackles. Cardiovascular S1-S2, tachycardic with a heart rate in the 120s. Abdomen was soft. Extremities did not show edema, clubbing or cyanosis.  He was given morphine 2 mg IVP for pain and 2 L of lactated Ringer's bolus. Labs were done. Please see results below.  Basic metabolic panel [[id number]] (Abnormal)   Collected: 12/24/20 0337   Updated: 12/24/20 0451   Specimen Type: Blood    Sodium 133Low mmol/L   Potassium 4.8 mmol/L   Chloride 99 mmol/L   CO2 16Low mmol/L   Glucose, Bld 78 mg/dL   BUN 91YNWG mg/dL   Creatinine, Ser 1.41High mg/dL   Calcium 8.9 mg/dL   GFR, Estimated 95AOZ mL/min   Anion gap 18High  Hepatic function panel [[id number]] (Abnormal)   Collected: 12/24/20 0337   Updated: 12/24/20 0451   Specimen Type: Blood    Total Protein 5.2Low g/dL   Albumin 9.6EXB g/dL   AST 284XLKG U/L   ALT 195High U/L   Alkaline Phosphatase 429High U/L   Total Bilirubin 1.8High mg/dL   Bilirubin, Direct 4.0NUUV mg/dL   Indirect Bilirubin 0.8 mg/dL  Magnesium [[id number]] (Abnormal)   Collected: 12/24/20 0337   Updated: 12/24/20 0451   Specimen Type: Blood    Magnesium 1.6Low mg/dL  Phosphorus [[id number]] (Abnormal)   Collected: 12/24/20 0337   Updated: 12/24/20 0451   Specimen Type: Blood    Phosphorus 5.0High mg/dL  Lactic acid, plasma [[id number]] (Abnormal)   Collected: 12/24/20 0337   Updated: 12/24/20 0449   Specimen Type: Blood    Lactic Acid, Venous 8.3High Panic  mmol/L   Given his lactic acidosis, AMS and lack of history from the patient, sodium bicarbonate 100 mEq IVP x1  ordered, sepsis protocol was started and PCCM was consulted. Dr. Cyndie Chime from PCCM will call the family to see what is their preference in regards to the patient's management.  Sanda Klein, MD.

## 2020-12-24 NOTE — Consult Note (Signed)
Chief Complaint: Patient was seen in consultation today for sepsis and cholecystitis  Referring Physician(s): Critical Care  Patient Status: [facility] - [facility]  History of Present Illness: Stanley Scott is a 70 y.o. male with evidence of sepsis and acute cholecystitis.  Patient was transferred to ICU today due to hypotensive and tachycardiac.  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  Patient is DNR.  Patient is confused and son is making medical decisions for patient.   Past Medical History:  Diagnosis Date  . Cardiomyopathy (HCC) 09/2020   EF 45-50% w/ inferoapical HK on echo, MV w/ scar, no ischemia at [facility] in setting of CVA  . Carotid artery disease (HCC) 09/2020  . CVA (cerebral vascular accident) (HCC) 09/2020  . Hyperlipidemia LDL goal <70   . Seizure [facility])     Past Surgical History:  Procedure Laterality Date  . CAROTID ENDARTERECTOMY Left 10/18/2020   Dr Zella Ball, Renette Butters    Allergies: Patient has no allergy information on record.  Medications: Prior to Admission medications   Medication Sig Start Date End Date Taking? Authorizing Provider  acetaminophen (TYLENOL) 500 MG tablet Take 500 mg by mouth every 6 (six) hours as needed for moderate pain or headache.   Yes [provider]  atorvastatin (LIPITOR) 40 MG tablet Take 40 mg by mouth daily. 10/08/20  Yes [provider]  cephALEXin (KEFLEX) 500 MG capsule Take 1 capsule (500 mg total) by mouth 4 (four) times daily. 11/25/20  Yes Vanetta Mulders, MD  ergocalciferol (VITAMIN D2) 1.25 MG (50000 UT) capsule Take 50,000 Units by mouth once a week. 10/08/20  Yes [provider]  folic acid (FOLVITE) 1 MG tablet Take 1 mg by mouth daily. 10/08/20  Yes [provider]  metoprolol tartrate (LOPRESSOR) 50 MG tablet Take 50 mg by mouth 2 (two) times daily. 11/30/20  Yes [provider]  pantoprazole (PROTONIX) 40 MG tablet Take 40 mg by mouth daily. 10/08/20  Yes [provider]  sodium chloride 1 g tablet Take 1 g by mouth 2 (two) times daily with a meal. 10/08/20  Yes [provider]  thiamine 100 MG tablet Take 100 mg by mouth daily. Patient not taking: No sig reported 10/08/20   [provider]     History reviewed. No pertinent family history.  Social History   Socioeconomic History  . Marital status: Widowed    Spouse name: Not on file  . Number of children: Not on file  . Years of education: Not on file  . Highest education level: Not on file  Occupational History  . Not on file  Tobacco Use  . Smoking status: Never Smoker  . Smokeless tobacco: Never Used  Vaping Use  . Vaping Use: Never used  Substance and Sexual Activity  . Alcohol use: Not Currently  . Drug use: Never  . Sexual activity: Not on file  Other Topics Concern  . Not on file  Social History Narrative  . Not on file   Social Determinants of Health   Financial Resource Strain: Not on file  Food Insecurity: Not on file  Transportation Needs: Not on file  Physical Activity: Not on file  Stress: Not on file  Social Connections: Not on file    Review of Systems  Constitutional:       Confusion  Gastrointestinal: Positive for abdominal pain.    Vital Signs: BP (!) 108/96   Pulse Marland Kitchen)  108   Temp 97.9 F (36.6 C) (Axillary)   Resp (!) 24   Ht 5\' 5"  (1.651 m)   Wt 47.2 kg   SpO2 100%   BMI 17.31 kg/m   Physical Exam Constitutional:      Comments: Confusion. Not answering my questions.   Cardiovascular:     Rate and Rhythm: Tachycardia present.  Pulmonary:     Breath sounds: Wheezing present.  Abdominal:     General: There is distension.     Tenderness: There is abdominal tenderness.     Comments: RUQ tenderness     Imaging: CT ABDOMEN PELVIS WO CONTRAST  Result Date: 12/24/2020 CLINICAL DATA:  Sepsis. EXAM: CT ABDOMEN  AND PELVIS WITHOUT CONTRAST TECHNIQUE: Multidetector CT imaging of the abdomen and pelvis was performed following the standard protocol without IV contrast. COMPARISON:  None. FINDINGS: Lower chest: Mild left basilar opacity is noted concerning for atelectasis or possibly infiltrate. Hepatobiliary: Gallbladder is distended with cholelithiasis. Significant amount of inflammatory changes noted around the gallbladder concerning for cholecystitis. No biliary dilatation is noted. The liver is unremarkable. Pancreas: Unremarkable. No pancreatic ductal dilatation or surrounding inflammatory changes. Spleen: Normal in size without focal abnormality. Adrenals/Urinary Tract: Adrenal glands and kidneys appear normal. No hydronephrosis or renal obstruction is noted. No renal or ureteral calculi are noted. Mild urinary bladder distention is noted. Stomach/Bowel: The stomach appears normal. There is no evidence of bowel obstruction or inflammation. The appendix is not clearly visualized. Vascular/Lymphatic: Aortic atherosclerosis. No enlarged abdominal or pelvic lymph nodes. Reproductive: Prostate is unremarkable. Other: No abdominal wall hernia or abnormality. No abdominopelvic ascites. Musculoskeletal: No acute or significant osseous findings. IMPRESSION: 1. Gallbladder distention is noted with cholelithiasis and surrounding inflammatory changes, consistent with acute cholecystitis. Ultrasound may be performed for further evaluation. These results will be called to the ordering clinician or representative by the Radiologist Assistant, and communication documented in the PACS or zVision Dashboard. 2. Mild left basilar opacity is noted concerning for atelectasis or possibly infiltrate. 3. Mild urinary bladder distention is noted. Aortic Atherosclerosis (ICD10-I70.0). Electronically Signed   By: 12/26/2020 M.D.   On: 12/24/2020 13:23   DG Forearm Left  Result Date: 11/25/2020 CLINICAL DATA:  Fall EXAM: LEFT FOREARM - 2  VIEW COMPARISON:  None. FINDINGS: There is no evidence of fracture or other focal bone lesions. Soft tissues are unremarkable. Degenerative change in the radiocarpal joint. IMPRESSION: Negative for fracture. Electronically Signed   By: 12/07/2020 M.D.   On: 11/16/2020 16:25   CT Head Wo Contrast  Result Date: 11/25/2020 CLINICAL DATA:  Mental status change EXAM: CT HEAD WITHOUT CONTRAST TECHNIQUE: Contiguous axial images were obtained from the base of the skull through the vertex without intravenous contrast. COMPARISON:  CT head 09/26/2020 FINDINGS: Brain: Moderate atrophy. Negative for hydrocephalus. Mild white matter hypodensity especially in the left frontal lobe is unchanged. Negative for acute infarct, hemorrhage, mass. Vascular: Negative for hyperdense vessel. Atherosclerotic calcification in the carotid and vertebral arteries bilaterally. Skull: Negative Sinuses/Orbits: Mild mucosal edema paranasal sinuses. Negative orbit Other: None IMPRESSION: Moderate atrophy. Mild chronic microvascular ischemic change. No acute abnormality no change from the prior study. Electronically Signed   By: 09/28/2020 M.D.   On: 11/25/2020 14:50   CT Chest Wo Contrast  Result Date: 11/25/2020 CLINICAL DATA:  Confusion.  Abnormal chest radiograph EXAM: CT CHEST WITHOUT CONTRAST TECHNIQUE: Multidetector CT imaging of the chest was performed following the standard protocol without IV contrast. COMPARISON:  Chest  radiograph November 25, 2020 FINDINGS: Cardiovascular: There is no thoracic aortic aneurysm. There is aortic atherosclerosis. There are multiple foci of calcification in visualized great vessels. There are multiple foci of coronary artery calcification. There is no pericardial effusion or pericardial thickening. Mediastinum/Nodes: Thyroid appears unremarkable. There are scattered subcentimeter mediastinal lymph nodes without adenopathy by size criteria. There is a small hiatal hernia. Lungs/Pleura: There  are multiple areas of scarring throughout the lungs. There are foci of bronchiectatic change bilaterally, most severe in the lower lobe regions but also moderate in the right upper lobe. There are areas of associated atelectatic change as well. There is no frank edema or consolidation. No pleural effusions are evident. Upper Abdomen: There is upper abdominal aortic atherosclerosis. Visualized upper abdominal structures otherwise appear unremarkable. Musculoskeletal: There are foci of degenerative change in the thoracic spine with diffuse idiopathic skeletal hyperostosis. No blastic or lytic bone lesions. No chest wall lesions evident. IMPRESSION: 1. Areas of bronchiectatic change, most severe in the lower lobe regions but also moderate in the right upper lobe and to a lesser degree in the left upper lobe. 2. Multiple areas of scarring and patchy atelectasis. No frank edema or consolidation. 3.  No evident adenopathy. 4. Aortic atherosclerosis. Foci of great vessel and coronary artery calcification. Comment: Given absence of prior radiographic examinations to compare, a follow-up radiographic examination in approximately 10-12 weeks to assess for stability is felt to be warranted and advisable. Aortic Atherosclerosis (ICD10-I70.0). Electronically Signed   By: Bretta Bang III M.D.   On: 11/25/2020 15:05   MR BRAIN WO CONTRAST  Result Date: 12/07/2020 CLINICAL DATA:  Acute neuro deficit. EXAM: MRI HEAD WITHOUT CONTRAST TECHNIQUE: Multiplanar, multiecho pulse sequences of the brain and surrounding structures were obtained without intravenous contrast. COMPARISON:  CT head 2020/12/16.  CT angio head and neck 09/26/2020 FINDINGS: Brain: Negative for acute infarct. Mild white matter changes bilaterally. Small chronic infarct left frontal lobe over the convexity. Negative for hemorrhage or mass. Moderate atrophy without hydrocephalus. Vascular: Normal arterial flow voids Skull and upper cervical spine: No focal  skeletal lesion. Cervical spondylosis with bony sclerosis as noted on the prior CTA neck. Sinuses/Orbits: Mild mucosal edema paranasal sinuses. Negative orbit. Other: None IMPRESSION: Moderate atrophy.  Small chronic infarct left frontal lobe. Negative for acute infarct. Electronically Signed   By: Marlan Palau M.D.   On: 12/07/2020 10:58   MR CERVICAL SPINE W WO CONTRAST  Result Date: 12/08/2020 CLINICAL DATA:  Bilateral upper extremity and lower extremity weakness. The patient suffered a fall approximately 4 weeks ago. EXAM: MRI CERVICAL SPINE WITHOUT AND WITH CONTRAST TECHNIQUE: Multiplanar and multiecho pulse sequences of the cervical spine, to include the craniocervical junction and cervicothoracic junction, were obtained without and with intravenous contrast. CONTRAST:  5 mL GADAVIST IV SOLN COMPARISON:  CT angiogram of the neck 09/27/2019. FINDINGS: Alignment: There is mild reversal of the normal cervical lordosis. Vertebrae: No fracture, evidence of discitis, or bone lesion. Degenerative endplate signal change is seen from C3-4 to C6-7, worst at C3-4. Cord: Edema without enhancement is seen in the cervical cord from approximately the inferior endplate of C3 is through the C4-5 level. Posterior Fossa, vertebral arteries, paraspinal tissues: Negative. Disc levels: C2-3: Disc bulge effaces the ventral thecal sac but the central canal and foramina appear open. C3-4: Disc bulge, uncovertebral disease and left much worse than right facet arthropathy. There is severe central canal and bilateral foraminal stenosis. The cord is markedly deformed. C4-5: Disc bulge, uncovertebral disease  and facet arthropathy on the left. There is severe central canal and bilateral foraminal narrowing. The cord is severely flattened. C5-6: Disc bulge and right worse than left uncovertebral disease. There is also moderate bilateral facet arthropathy. The ventral thecal sac is nearly effaced. Moderately severe to severe foraminal  narrowing is worse on the right. C6-7: Disc bulge and uncovertebral spurring. Bilateral facet arthropathy. The central canal is open. Severe bilateral foraminal narrowing. C7-T1: Negative. IMPRESSION: Severe central canal and bilateral foraminal stenosis at C3-4 and C4-5. 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 Moderately severe to severe bilateral foraminal narrowing at C5-6 is worse on the right. The ventral thecal sac is nearly effaced by disc at this level. Severe bilateral foraminal narrowing at C6-7. The central canal is open at this level. These results were called by telephone at the time of interpretation on 12/08/2020 at 12:31 pm to [facility] , who verbally acknowledged these results. Electronically Signed   By: Drusilla Kannerhomas  Dalessio M.D.   On: 12/08/2020 12:35   DG Chest Port 1 View  Result Date: 12/24/2020 CLINICAL DATA:  Leukocytosis. EXAM: PORTABLE CHEST 1 VIEW COMPARISON:  12/15/2020. FINDINGS: There is airspace opacity in the left lung base, retrocardiac region, new since the prior study. There is from a linear type opacity in the left mid lung similar to the prior exam consistent with scarring. Milder opacity is noted at the medial right lung base, not convincingly changed from the prior exam, allowing for the patient's current positioning, rotated to the left. Remainder of the lungs is clear. No convincing pleural effusion and no pneumothorax. Cardiac silhouette normal in size.  No mediastinal or hilar masses. Skeletal structures are demineralized but grossly intact. IMPRESSION: 1. Airspace opacity at the left lung base concerning for pneumonia. 2. No other evidence of acute cardiopulmonary disease. Electronically Signed   By: Amie Portlandavid  Ormond M.D.   On: 12/24/2020 10:42   DG CHEST PORT 1 VIEW  Result Date: 12/15/2020 CLINICAL DATA:  Airway aspiration EXAM:  PORTABLE CHEST 1 VIEW COMPARISON:  11/26/2020 FINDINGS: Negative for pneumonia. Negative for heart failure. Streaky lung markings bilaterally unchanged most consistent with scarring. IMPRESSION: No active disease. Electronically Signed   By: Marlan Palauharles  Clark M.D.   On: 12/15/2020 13:38   DG Chest Port 1 View  Result Date: 11/29/2020 CLINICAL DATA:  Questionable sepsis. EXAM: PORTABLE CHEST 1 VIEW COMPARISON:  December 17, 21. FINDINGS: The heart size and mediastinal contours are within normal limits. Aortic atherosclerosis. No consolidation. Similar mild right basilar atelectasis/scar. No visible pleural effusions or pneumothorax. Right-sided skin fold. No acute osseous abnormality. EKG leads project over the chest. IMPRESSION: No acute cardiopulmonary disease. Electronically Signed   By: Feliberto HartsFrederick S Jones MD   On: 11/26/2020 13:23   DG Chest Port 1 View  Result Date: 11/25/2020 CLINICAL DATA:  Increasing weakness syncope is EXAM: PORTABLE CHEST 1 VIEW COMPARISON:  None. FINDINGS: Mild right basilar atelectasis/scarring. No focal consolidation. No pleural effusion or pneumothorax. Heart and mediastinal contours are unremarkable. No acute osseous abnormality. IMPRESSION: No active disease. Electronically Signed   By: Elige KoHetal  Patel   On: 11/25/2020 11:59   DG Shoulder Left  Result Date: 11/23/2020 CLINICAL DATA:  Fall EXAM: LEFT SHOULDER - 2+ VIEW COMPARISON:  None. FINDINGS: There is no evidence of fracture or dislocation. There is no evidence of arthropathy or other focal bone abnormality. Soft tissues  are unremarkable. IMPRESSION: Negative. Electronically Signed   By: Marlan Palauharles  Clark M.D.   On: 11/26/2020 16:26   DG Hand Complete Left  Result Date: 12/01/2020 CLINICAL DATA:  Fall EXAM: LEFT HAND - COMPLETE 3+ VIEW COMPARISON:  None. FINDINGS: Negative for acute fracture Moderate degenerative change in the radiocarpal joint with joint space narrowing and spurring. No erosion identified. IMPRESSION:  Negative for fracture. Electronically Signed   By: Marlan Palauharles  Clark M.D.   On: 12/04/2020 16:24   DG Swallowing Func-Speech Pathology  Result Date: 12/08/2020 Objective Swallowing Evaluation: Type of Study: MBS-Modified Barium Swallow Study  Patient Details Name: Jarad Critchleylvin Bayman MRN: [id number] Date of Birth: 10/01/1951 Today's Date: 12/08/2020 Time: SLP Start Time (ACUTE ONLY): 1210 -SLP Stop Time (ACUTE ONLY): 1235 SLP Time Calculation (min) (ACUTE ONLY): 25 min Past Medical History: No past medical history on file. Past Surgical History: No past surgical history on file. HPI: Armstead Critchleylvin Stipe is a 70 y.o. 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  Patient was unable to provide history, history was obtained from ED PA and ED medical record.  Per report, patient was reported to have had several episodes of loose to watery stools daily for the past week.  He has also had poor appetite, though family encouraged oral hydration.  Patient sustained a fall by landing on his outstretched left upper extremity while being transferred from his bed to the bedside commode (around 2 AM today).  He complained of left thumb, forearm and shoulder pain, but denies head injury.  Patient was seen in the ED on 12/17 and was suspected to have acute cystitis without hematuria, he was discharged home with Keflex at that time.   Patient was reported to be sedentary at baseline and lives with son who provide significant assistance with ADLs.  He denies chest pain, shortness of breath, nausea, vomiting, abdominal pain.  Subjective: 'Ok" Assessment / Plan / Recommendation CHL IP CLINICAL IMPRESSIONS 12/08/2020 Clinical Impression MBSS completed. Pt presents with moderate oropharyngeal dysphagia characterized by reduced  labial closure, impaired mastication, with mildly prolonged oral phase, min delay in swallow initiation with swallow trigger at the level of the valleculae and pyriforms with liquids, reduced tongue base retraction, and reduced epiglottic deflection resulting in moderate vallecular residue, penetration of liquids during and after the swallow without witnessed aspiration, and eventual pooling of vallecular residuals into pyriforms. Pt's epiglottis appeared thick and bulbous at the base which became coated with initial tsp presentation of thins. It is possible that Pt had puree in valleculae from previously presented medications in puree, however it may be prudent to obtain CT neck (unless tissure abnormalities can be seen from MRI he just had). Pt with significant vallecular residuals with all textures and consistencies. Chin tuck and head turns were trialed and only the chin tuck was effective and only with puree and solids (no benefit with liquids). Pt cued to implement chin to chest when swallowing puree and mech soft and this facilitated clearance during the swallow in the pharynx. Pt unable to hold the cup himself due to BUE weakness, so he was assessed mostly with straws for liquids. There was no appreciable difference in NTL and thins, therefore recommend thin liquids via straw. Recommend D2 and thin liquids via straw sips when Pt is alert and upright, chin tuck with  solids, cue to repeat/dry swallow, and clear throat with repeat swallow, po medications can be presented crushed or whole in puree. Pt is at risk for aspiration of residuals after the swallow (valleculae), however this is a risk no matter the textures or consistencies. No aspiration observed today. Consider neck CT to evaluate epiglottis thickening. SLP will follow. SLP Visit Diagnosis Dysphagia, oropharyngeal phase (R13.12) Attention and concentration deficit following -- Frontal lobe and executive function deficit following -- Impact on safety  and function Mild aspiration risk   CHL IP TREATMENT RECOMMENDATION 12/08/2020 Treatment Recommendations Therapy as outlined in treatment plan below   Prognosis 12/08/2020 Prognosis for Safe Diet Advancement Fair Barriers to Reach Goals Severity of deficits Barriers/Prognosis Comment -- [facility]L IP DIET RECOMMENDATION 12/08/2020 SLP Diet Recommendations Dysphagia 2 (Fine chop) solids;Thin liquid Liquid Administration via Straw Medication Administration Whole meds with puree Compensations Slow rate;Small sips/bites;Multiple dry swallows after each bite/sip;Clear throat intermittently;Effortful swallow;Chin tuck Postural Changes Remain semi-upright after after feeds/meals (Comment);Seated upright at 90 degrees   CHL IP OTHER RECOMMENDATIONS 12/08/2020 Recommended Consults (No Data) Oral Care Recommendations Oral care BID;Staff/trained caregiver to provide oral care Other Recommendations Clarify dietary restrictions   [facility] IP FOLLOW UP RECOMMENDATIONS 12/08/2020 Follow up Recommendations Skilled Nursing facility   [facility] IP FREQUENCY AND DURATION 12/08/2020 Speech Therapy Frequency (ACUTE ONLY) min 2x/week Treatment Duration 1 week      CHL IP ORAL PHASE 12/08/2020 Oral Phase Impaired Oral - Pudding Teaspoon -- Oral - Pudding Cup -- Oral - Honey Teaspoon -- Oral - Honey Cup -- Oral - Nectar Teaspoon -- Oral - Nectar Cup -- Oral - Nectar Straw Weak lingual manipulation Oral - Thin Teaspoon Left anterior bolus loss;Right anterior bolus loss Oral - Thin Cup Left anterior bolus loss;Right anterior bolus loss Oral - Thin Straw WFL Oral - Puree (No Data) Oral - Mech Soft Impaired mastication Oral - Regular -- Oral - Multi-Consistency -- Oral - Pill Delayed oral transit;Reduced posterior propulsion Oral Phase - Comment --  CHL IP PHARYNGEAL PHASE 12/08/2020 Pharyngeal Phase Impaired Pharyngeal- Pudding Teaspoon -- Pharyngeal -- Pharyngeal- Pudding Cup -- Pharyngeal -- Pharyngeal- Honey Teaspoon -- Pharyngeal -- Pharyngeal- Honey Cup  -- Pharyngeal -- Pharyngeal- Nectar Teaspoon -- Pharyngeal -- Pharyngeal- Nectar Cup -- Pharyngeal -- Pharyngeal- Nectar Straw Delayed swallow initiation-vallecula;Delayed swallow initiation-pyriform sinuses;Reduced epiglottic inversion;Reduced tongue base retraction;Penetration/Aspiration during swallow;Penetration/Apiration after swallow;Pharyngeal residue - valleculae;Pharyngeal residue - pyriform;Lateral channel residue Pharyngeal Material does not enter airway;Material enters airway, remains ABOVE vocal cords then ejected out;Material enters airway, remains ABOVE vocal cords and not ejected out Pharyngeal- Thin Teaspoon Delayed swallow initiation-vallecula;Reduced epiglottic inversion;Reduced tongue base retraction;Penetration/Aspiration during swallow;Penetration/Apiration after swallow;Pharyngeal residue - valleculae Pharyngeal Material does not enter airway;Material enters airway, remains ABOVE vocal cords then ejected out Pharyngeal- Thin Cup Delayed swallow initiation-pyriform sinuses;Reduced epiglottic inversion;Reduced tongue base retraction;Penetration/Aspiration during swallow;Penetration/Apiration after swallow;Pharyngeal residue - valleculae;Pharyngeal residue - pyriform;Lateral channel residue Pharyngeal Material enters airway, remains ABOVE vocal cords then ejected out;Material enters airway, remains ABOVE vocal cords and not ejected out;Material does not enter airway Pharyngeal- Thin Straw Delayed swallow initiation-pyriform sinuses;Reduced epiglottic inversion;Penetration/Aspiration during swallow;Penetration/Apiration after swallow;Pharyngeal residue - valleculae;Pharyngeal residue - pyriform;Lateral channel residue Pharyngeal Material does not enter airway;Material enters airway, remains ABOVE vocal cords then ejected out;Material enters airway, remains ABOVE vocal cords and not ejected out Pharyngeal- Puree Delayed swallow initiation-vallecula;Reduced epiglottic inversion;Reduced tongue base  retraction;Pharyngeal residue - valleculae Pharyngeal -- Pharyngeal- Mechanical Soft -- Pharyngeal -- Pharyngeal- Regular -- Pharyngeal -- Pharyngeal- Multi-consistency -- Pharyngeal -- Pharyngeal-  Pill WFL Pharyngeal -- Pharyngeal Comment --  CHL IP CERVICAL ESOPHAGEAL PHASE 12/08/2020 Cervical Esophageal Phase WFL Pudding Teaspoon -- Pudding Cup -- Honey Teaspoon -- Honey Cup -- Nectar Teaspoon -- Nectar Cup -- Nectar Straw -- Thin Teaspoon -- Thin Cup -- Thin Straw -- Puree -- Mechanical Soft -- Regular -- Multi-consistency -- Pill -- Cervical Esophageal Comment -- Thank you, Havery Moros, CCC-SLP [phone] PORTER,DABNEY 12/08/2020, 2:08 PM              [place] EKG SITE RITE  Result Date: 12/24/2020 If Site Rite image not attached, placement could not be confirmed due to current cardiac rhythm.   Labs:  CBC: Recent Labs    12/20/20 0210 12/21/20 0340 12/22/20 0317 12/24/20 0645  WBC 20.8* 17.4* 14.6* 50.4*  HGB 9.9* 9.2* 9.7* 7.8*  HCT 30.5* 29.7* 30.8* 25.0*  PLT 146* 136* 138* 117*    COAGS: Recent Labs    12/09/2020 1217 12/06/20 0405 12/24/20 1438  INR 1.2 1.2 1.5*  APTT 46* 48* 44*    BMP: Recent Labs    12/19/20 0800 12/21/20 0340 12/24/20 0337 12/24/20 0645  NA 138 135 133* 134*  K 4.0 4.5 4.8 4.3  CL 104 103 99 98  CO2 27 22 16* 16*  GLUCOSE 110* 211* 78 59*  BUN 18 17 39* 39*  CALCIUM 8.6* 8.6* 8.9 8.3*  CREATININE 0.51* 0.59* 1.41* 1.39*  GFRNONAA >60 >60 54* 55*    LIVER FUNCTION TESTS: Recent Labs    12/13/20 0613 12/14/20 0328 12/24/20 0337 12/24/20 0645  BILITOT 0.5 0.8 1.8* 1.8*  AST 35 30 357* 353*  ALT 31 34 195* 191*  ALKPHOS 101 100 429* 334*  PROT 6.0* 5.5* 5.2* 4.8*  ALBUMIN 1.9* 1.8* 1.9* 1.7*    TUMOR MARKERS: No results for input(s): AFPTM, CEA, CA199, CHROMGRNA in the last 8760 hours.  Assessment and Plan:  69 yo with sepsis and evidence for acute calculus cholecystitis on CT. Discussed with Dr. Gaynelle Adu in General  Surgery and patient is not a good surgical candidate at this time.  Patient is on vasopressors and markedly elevated WBC, now 50.4.  Patient has sepsis and needs source control.  Patient is a candidate for percutaneous cholecystostomy based on CT findings.  Discussed tube placement with son and explained that this tube will be in place for minimum of 6 weeks but could be chronic if he is not a surgical candidate.  Risks of the procedure including, but not limited to bleeding, infection, gallbladder perforation, bile leak, sepsis and death. Telephone consent obtained from the son.  Plan for image guided percutaneous cholecystostomy tube placement today.    Electronically Signed: Arn Medal, MD 12/24/2020, 5:32 PM

## 2020-12-24 NOTE — Consult Note (Signed)
NAME:  Equan Cogbill, MRN:  [id number], DOB:  Apr 22, 1951, LOS: 19 ADMISSION DATE:  11/09/2020, CONSULTATION DATE: 24 December 2020 REFERRING MD: Dr. Robb Matar, CHIEF COMPLAINT: Elevated lactic acid  Brief History:  70 year old black male with septic shock  History of Present Illness:  70 year old male that originally presented the emergency room with electrolyte disturbances.  Patient was found to have C. difficile colitis and was admitted to the medical floor.  During the patient's hospital stay he had progressive quadriparesis.  MRI of the cervical spine demonstrated severe stenosis.  Neurosurgery had been consulted but he was not considered a neurosurgical candidate secondary to his comorbidities.  He also has a known unstageable sacral decubiti.  During this evening the patient became hypotensive and started complaining of generalized pain.  Lactic acid was obtained and found to be greater than 8.  Patient is a DNR but wishes concerning vasoactive medications are unclear at this time.  Past Medical History:  Left MCA CVA Left carotid stenosis Hypertension GI bleed secondary to AVM Urinary tract infection   Micro Data:  Influenza: Negative COVID: Negative RPR: Negative Stool: C. difficile: + 12/06/2020 Antimicrobials:  Cefepime Vancomycin Flagyl   Objective   Blood pressure (!) 92/53, pulse (!) 121, temperature 98.2 F (36.8 C), temperature source Oral, resp. rate 20, height 5\' 5"  (1.651 m), weight 47.2 kg, SpO2 91 %.        Intake/Output Summary (Last 24 hours) at 12/24/2020 0544 Last data filed at 12/24/2020 0520 Gross per 24 hour  Intake 2442.24 ml  Output --  Net 2442.24 ml   Filed Weights   12/21/20 0300 12/23/20 0500 12/24/20 0439  Weight: 52.9 kg 48.5 kg 47.2 kg    Examination: General: Appears to be uncomfortable. HENT: Atraumatic/normocephalic mucous membranes are dry Lungs: Clear to auscultation no wheezing rales or rhonchi Cardiovascular: Regular  rate Abdomen: Soft, nondistended, positive bowel sounds, no rebound/rigidity/guarding Extremities: Distal pulses intact x4.  No edema.  Positive skin tenting positive clubbing Neuro: Awake, moaning, not consistently following commands.  Spontaneously moving toes and hands    Assessment & Plan:  Septic shock Lung ratable sacral decubiti C. difficile colitis Severe intravascular volume depletion Cervical spine stenosis Lactic acidosis AKI  Plan: Attempted to call 12/26/20 the patient's son to clarify patient's wishes for vasoactive support.  Unfortunately his number does not have a voicemail set up at this time and there was no answer. Transfer the patient to the intensive care unit. Continue aggressive volume resuscitation. Patient may need vasoactive support. Monitor I/Os Blood cultures are pending Cefepime and vancomycin being added. Continue Flagyl.  Best practice (evaluated daily)  Diet: npo Pain/Anxiety/Delirium protocol (if indicated): Add fentanyl 25 mcg IV every 2 hours as needed VAP protocol (if indicated):n/a DVT prophylaxis:  GI prophylaxis:  Protonix Glucose control: Insulin sliding scale Mobility: Bedrest Disposition: Transfer to ICU  Goals of Care:   Code Status: DNR  Labs   CBC: Recent Labs  Lab 12/20/20 0210 12/21/20 0340 12/22/20 0317  WBC 20.8* 17.4* 14.6*  NEUTROABS  --  15.5*  --   HGB 9.9* 9.2* 9.7*  HCT 30.5* 29.7* 30.8*  MCV 90.8 93.4 91.7  PLT 146* 136* 138*    Basic Metabolic Panel: Recent Labs  Lab 12/19/20 0800 12/21/20 0340 12/24/20 0337  NA 138 135 133*  K 4.0 4.5 4.8  CL 104 103 99  CO2 27 22 16*  GLUCOSE 110* 211* 78  BUN 18 17 39*  CREATININE 0.51* 0.59* 1.41*  CALCIUM 8.6* 8.6* 8.9  MG  --   --  1.6*  PHOS  --   --  5.0*   GFR: Estimated Creatinine Clearance: 33 mL/min (A) (by C-G formula based on SCr of 1.41 mg/dL (H)). Recent Labs  Lab 12/20/20 0210 12/21/20 0340 12/22/20 0317 12/24/20 0337  WBC  20.8* 17.4* 14.6*  --   LATICACIDVEN  --   --   --  8.3*    Liver Function Tests: Recent Labs  Lab 12/24/20 0337  AST 357*  ALT 195*  ALKPHOS 429*  BILITOT 1.8*  PROT 5.2*  ALBUMIN 1.9*   No results for input(s): LIPASE, AMYLASE in the last 168 hours. No results for input(s): AMMONIA in the last 168 hours.  ABG No results found for: PHART, PCO2ART, PO2ART, HCO3, TCO2, ACIDBASEDEF, O2SAT   Coagulation Profile: No results for input(s): INR, PROTIME in the last 168 hours.  Cardiac Enzymes: No results for input(s): CKTOTAL, CKMB, CKMBINDEX, TROPONINI in the last 168 hours.  HbA1C: Hgb A1c MFr Bld  Date/Time Value Ref Range Status  12/12/2020 11:20 AM 5.7 (H) 4.8 - 5.6 % Final    Comment:    (NOTE) Pre diabetes:          5.7%-6.4%  Diabetes:              >6.4%  Glycemic control for   <7.0% adults with diabetes     CBG: Recent Labs  Lab 12/22/20 2227 12/23/20 0633 12/23/20 1250 12/23/20 1609 12/23/20 2149  GLUCAP 207* 121* 129* 104* 90    Review of Systems:   Unable be completed secondary to neurologic status.  Past Medical History:  He,  has a past medical history of Cardiomyopathy (HCC) (09/2020), Carotid artery disease (HCC) (09/2020), CVA (cerebral vascular accident) (HCC) (09/2020), Hyperlipidemia LDL goal <70, and Seizure (HCC).   Surgical History:   Past Surgical History:  Procedure Laterality Date  . CAROTID ENDARTERECTOMY Left 10/18/2020   Dr Zella Ball, [facility]     Social History:   reports that he has never smoked. He has never used smokeless tobacco. He reports previous alcohol use. He reports that he does not use drugs.   Family History:  His family history is not on file.   Allergies Not on File   Home Medications  Prior to Admission medications   Medication Sig Start Date End Date Taking? Authorizing Provider  acetaminophen (TYLENOL) 500 MG tablet Take 500 mg by mouth every 6 (six) hours as needed for moderate pain or  headache.   Yes [provider]  atorvastatin (LIPITOR) 40 MG tablet Take 40 mg by mouth daily. 10/08/20  Yes [provider]  cephALEXin (KEFLEX) 500 MG capsule Take 1 capsule (500 mg total) by mouth 4 (four) times daily. 11/25/20  Yes Vanetta Mulders, MD  ergocalciferol (VITAMIN D2) 1.25 MG (50000 UT) capsule Take 50,000 Units by mouth once a week. 10/08/20  Yes [provider]  folic acid (FOLVITE) 1 MG tablet Take 1 mg by mouth daily. 10/08/20  Yes [provider]  metoprolol tartrate (LOPRESSOR) 50 MG tablet Take 50 mg by mouth 2 (two) times daily. 11/30/20  Yes [provider]  pantoprazole (PROTONIX) 40 MG tablet Take 40 mg by mouth daily. 10/08/20  Yes [provider]  sodium chloride 1 g tablet Take 1 g by mouth 2 (two) times daily with a meal. 10/08/20  Yes [provider]  thiamine 100 MG tablet Take 100 mg by mouth daily. Patient  not taking: No sig reported 10/08/20   [provider]     Critical care time: 40 minutes

## 2020-12-24 NOTE — Procedures (Signed)
Interventional Radiology Procedure:   Indications: Acute cholecystitis  Procedure: Percutaneous cholecystostomy tube placement   Findings: Distended gallbladder with stones.  10 Fr drain placed and 80 ml of brown fluid removed.    Complications: None     EBL: less than 10 ml  Plan: Send fluid for culture.    Kineta Fudala R. Lowella Dandy, MD  Pager: [phone]

## 2020-12-24 NOTE — Significant Event (Signed)
Rapid Response Event Note   Reason for Call :  LA-8.3, pt started on sepsis protocol  Initial Focused Assessment:  Pt lying in bed, moaning. Pt will not speak but will open eyes when his name is called. Pt alternating between thrashing from side to side in bed, and snoring. Pt has congested cough. Lungs diminished t/o. Skin cool to touch.   T-98.2, HR-111, BP-79/64, RR-18, SpO2-91% on 2L [place].    Interventions:  Sepsis protocol initiated: 1.5 L LR/2 amps bicarb/labs BC x2-unable to obtain(okay to start Abx per Dr. Nicole Cella) Vanc/Flagy/Maxipime ordered 2g Mg ordered Plan of Care:  Tx to 4N20   Event Summary:   MD Notified: Dr. Robb Matar notified PTA RRT and came to bedside, PCCM MD Dorothy consulted and came to bedside  Call Time:0504 Arrival Time:0520 End LKGM:0102  Terrilyn Saver, RN

## 2020-12-24 NOTE — Progress Notes (Signed)
   12/23/20 2347  Assess: MEWS Score  Temp 98 F (36.7 C)  BP 94/70  Pulse Rate (!) 132  Resp 20  SpO2 98 %  O2 Device Room Air  Assess: MEWS Score  MEWS Temp 0  MEWS Systolic 1  MEWS Pulse 3  MEWS RR 0  MEWS LOC 0  MEWS Score 4  MEWS Score Color Red  Assess: if the MEWS score is Yellow or Red  Were vital signs taken at a resting state? Yes  Focused Assessment Change from prior assessment (see assessment flowsheet)  Early Detection of Sepsis Score *See Row Information* Medium  MEWS guidelines implemented *See Row Information* Yes  Treat  MEWS Interventions Administered scheduled meds/treatments;Administered prn meds/treatments;Escalated (See documentation below)  Pain Scale Faces  Faces Pain Scale 10  Pain Type Acute pain;Chronic pain  Pain Location Sacrum  Pain Descriptors / Indicators Aching  Pain Frequency Constant  Pain Onset On-going  Patients Stated Pain Goal 0  Pain Intervention(s) Medication (See eMAR);Repositioned;Rest  Facial Expression 2  Take Vital Signs  Increase Vital Sign Frequency  Red: Q 1hr X 4 then Q 4hr X 4, if remains red, continue Q 4hrs  Escalate  MEWS: Escalate Red: discuss with charge nurse/RN and provider, consider discussing with RRT  Notify: Charge Nurse/RN  Name of Charge Nurse/RN Notified Gershon Cull  Date Charge Nurse/RN Notified 12/23/20  Time Charge Nurse/RN Notified 2347  Notify: Provider  Provider Name/Title Dr.  Robb Matar  Date Provider Notified 12/23/20  Time Provider Notified 2350  Notification Type Page  Notification Reason Change in status  Response See new orders  Date of Provider Response 12/23/20  Time of Provider Response 2357  Document  Patient Outcome Stabilized after interventions

## 2020-12-24 NOTE — Progress Notes (Signed)
Pt kept on moaning and groaning during shift, pt still due to void after receiving 2 fluid boluses; VSS, pt placed on 2L oxygen [place], pt assessed by MD and new orders received; Rapid response nurse notified and at bedside; ordered fluid transfusing. Report called off to 4N ICU nurse. Pt transported off unit via bed with belongings to the side by rapid response RN and 2 NT. Dionne Bucy RN

## 2020-12-24 NOTE — Progress Notes (Signed)
After pt received bolus VS. MD notified and MD at bedside to assess pt. Dionne Bucy RN   12/24/20 0153  Assess: MEWS Score  BP (!) 88/53  Pulse Rate (!) 125  Resp 20  SpO2 100 %  O2 Device Nasal Cannula  O2 Flow Rate (L/min) 2 L/min  Assess: MEWS Score  MEWS Temp 0  MEWS Systolic 1  MEWS Pulse 2  MEWS RR 0  MEWS LOC 0  MEWS Score 3  MEWS Score Color Yellow  Assess: if the MEWS score is Yellow or Red  Were vital signs taken at a resting state? Yes  Focused Assessment No change from prior assessment  Early Detection of Sepsis Score *See Row Information* Medium  MEWS guidelines implemented *See Row Information* No, vital signs rechecked

## 2020-12-24 NOTE — Progress Notes (Signed)
  Son Harvie Heck, updated at bedside over events of last night and CT findings.    CT abd/ pelvis showed:  1. Gallbladder distention is noted with cholelithiasis and surrounding inflammatory changes, consistent with acute cholecystitis. Ultrasound may be performed for further evaluation.  These results will be called to the ordering clinician or representative by the Radiologist Assistant, and communication documented in the PACS or zVision Dashboard. 2. Mild left basilar opacity is noted concerning for atelectasis or possibly infiltrate. 3. Mild urinary bladder distention is noted.   Acute cholecystitis Septic shock  Developing LLL PNA, likely aspiration as he has been laying on his left P:  Surgery consulted, appreciate recs.   coags ordered Cortisol pending to r/o AI      Posey Boyer, ACNP [facility] 12/24/2020, 2:15 PM

## 2020-12-24 NOTE — Progress Notes (Signed)
TRH night shift.  The nursing staff reports the patient is uncomfortable moaning and groaning in bed with pain despite being given morphine 2 mg IVP at 2145, which he can have every 3 hours as needed.  He also seems to be volume depleted, with dry oral mucosa and only has had 100 mL of urine output so far during the shift.  He has not had significant oral intake during the past day. Morphine 2 mg IVP x1, LR bolus 1000 mL x 1 and LR infusion at 75 mL/h x 20 hours ordered.  Sanda Klein, MD.

## 2020-12-24 NOTE — Progress Notes (Signed)
eLink Physician-Brief Progress Note Patient Name: Stanley Scott DOB: Sep 18, 1951 MRN: [id number]   Date of Service  12/24/2020  HPI/Events of Note  Patient transferred from the floor due to evolving septic shock.  eICU Interventions  New Patient Evaluation completed, Levophed ordered, Fluid boluses per sepsis protocol running currently, PCCM will see patient.        Migdalia Dk 12/24/2020, 6:36 AM

## 2020-12-24 NOTE — Progress Notes (Signed)
Peripherally Inserted Central Catheter Placement  The IV Nurse has discussed with the patient and/or persons authorized to consent for the patient, the purpose of this procedure and the potential benefits and risks involved with this procedure.  The benefits include less needle sticks, lab draws from the catheter, and the patient may be discharged home with the catheter. Risks include, but not limited to, infection, bleeding, blood clot (thrombus formation), and puncture of an artery; nerve damage and irregular heartbeat and possibility to perform a PICC exchange if needed/ordered by physician.  Alternatives to this procedure were also discussed.  Bard Power PICC patient education guide, fact sheet on infection prevention and patient information card has been provided to patient /or left at bedside.    PICC Placement Documentation  PICC Triple Lumen 12/24/20 PICC Right Brachial 35 cm 0 cm (Active)  Indication for Insertion or Continuance of Line Vasoactive infusions 12/24/20 1219  Exposed Catheter (cm) 0 cm 12/24/20 1219  Dressing Change Due 12/31/20 12/24/20 1219    Consent obtained by floor RN  Dewain Penning M 12/24/2020, 12:20 PM

## 2020-12-24 NOTE — Progress Notes (Signed)
Pharmacy Antibiotic Note  Stanley Scott is a 70 y.o. male with hypotension, elevated lactate, possible sepsis.  Pharmacy has been consulted for Vancomycin and Cefepime  dosing.  Plan: Vancomycin 1 g IV now, then 500 mg IV q48h Cefepime 2 g IV q12h F/U renal function   Height: 5\' 5"  (165.1 cm) Weight: 47.2 kg (104 lb) IBW/kg (Calculated) : 61.5  Temp (24hrs), Avg:97.8 F (36.6 C), Min:97.6 F (36.4 C), Max:98.2 F (36.8 C)  Recent Labs  Lab 12/19/20 0800 12/20/20 0210 12/21/20 0340 12/22/20 0317 12/24/20 0337  WBC  --  20.8* 17.4* 14.6*  --   CREATININE 0.51*  --  0.59*  --  1.41*  LATICACIDVEN  --   --   --   --  8.3*    Estimated Creatinine Clearance: 33 mL/min (A) (by C-G formula based on SCr of 1.41 mg/dL (H)).    Not on File   12/26/20 12/24/2020 5:01 AM

## 2020-12-25 DIAGNOSIS — A0472 Enterocolitis due to Clostridium difficile, not specified as recurrent: Secondary | ICD-10-CM | POA: Diagnosis not present

## 2020-12-25 LAB — BLOOD CULTURE ID PANEL (REFLEXED) - BCID2

## 2020-12-25 LAB — GLUCOSE, CAPILLARY
Glucose-Capillary: 103 mg/dL — ABNORMAL HIGH (ref 70–99)
Glucose-Capillary: 106 mg/dL — ABNORMAL HIGH (ref 70–99)
Glucose-Capillary: 125 mg/dL — ABNORMAL HIGH (ref 70–99)
Glucose-Capillary: 130 mg/dL — ABNORMAL HIGH (ref 70–99)
Glucose-Capillary: 90 mg/dL (ref 70–99)
Glucose-Capillary: 90 mg/dL (ref 70–99)

## 2020-12-25 LAB — COMPREHENSIVE METABOLIC PANEL
ALT: 243 U/L — ABNORMAL HIGH (ref 0–44)
AST: 357 U/L — ABNORMAL HIGH (ref 15–41)
Albumin: 1.8 g/dL — ABNORMAL LOW (ref 3.5–5.0)
Alkaline Phosphatase: 295 U/L — ABNORMAL HIGH (ref 38–126)
Anion gap: 14 (ref 5–15)
BUN: 24 mg/dL — ABNORMAL HIGH (ref 8–23)
CO2: 22 mmol/L (ref 22–32)
Calcium: 8.4 mg/dL — ABNORMAL LOW (ref 8.9–10.3)
Chloride: 102 mmol/L (ref 98–111)
Creatinine, Ser: 0.82 mg/dL (ref 0.61–1.24)
GFR, Estimated: >60 mL/min (ref >60)
Glucose, Bld: 130 mg/dL — ABNORMAL HIGH (ref 70–99)
Potassium: 3.7 mmol/L (ref 3.5–5.1)
Sodium: 138 mmol/L (ref 135–145)
Total Bilirubin: 2.3 mg/dL — ABNORMAL HIGH (ref 0.3–1.2)
Total Protein: 5.3 g/dL — ABNORMAL LOW (ref 6.5–8.1)

## 2020-12-25 LAB — CBC WITH DIFFERENTIAL/PLATELET
Abs Immature Granulocytes: 1.63 10*3/uL — ABNORMAL HIGH (ref 0.00–0.07)
Basophils Absolute: 0.1 10*3/uL (ref 0.0–0.1)
Basophils Relative: 0 %
Eosinophils Absolute: 0 10*3/uL (ref 0.0–0.5)
Eosinophils Relative: 0 %
HCT: 25 % — ABNORMAL LOW (ref 39.0–52.0)
Hemoglobin: 7.8 g/dL — ABNORMAL LOW (ref 13.0–17.0)
Immature Granulocytes: 3 %
Lymphocytes Relative: 1 %
Lymphs Abs: 0.4 10*3/uL — ABNORMAL LOW (ref 0.7–4.0)
MCH: 29.9 pg (ref 26.0–34.0)
MCHC: 31.2 g/dL (ref 30.0–36.0)
MCV: 95.8 fL (ref 80.0–100.0)
Monocytes Absolute: 1.2 10*3/uL — ABNORMAL HIGH (ref 0.1–1.0)
Monocytes Relative: 2 %
Neutro Abs: 47.1 10*3/uL — ABNORMAL HIGH (ref 1.7–7.7)
Neutrophils Relative %: 94 %
Platelets: 117 10*3/uL — ABNORMAL LOW (ref 150–400)
RBC: 2.61 MIL/uL — ABNORMAL LOW (ref 4.22–5.81)
RDW: 22.1 % — ABNORMAL HIGH (ref 11.5–15.5)
WBC: 50.4 10*3/uL (ref 4.0–10.5)
nRBC: 0 % (ref 0.0–0.2)

## 2020-12-25 LAB — CBC
HCT: 30.7 % — ABNORMAL LOW (ref 39.0–52.0)
Hemoglobin: 9.4 g/dL — ABNORMAL LOW (ref 13.0–17.0)
MCH: 28.7 pg (ref 26.0–34.0)
MCHC: 30.6 g/dL (ref 30.0–36.0)
MCV: 93.6 fL (ref 80.0–100.0)
Platelets: 117 10*3/uL — ABNORMAL LOW (ref 150–400)
RBC: 3.28 MIL/uL — ABNORMAL LOW (ref 4.22–5.81)
RDW: 22.4 % — ABNORMAL HIGH (ref 11.5–15.5)
WBC: 36 10*3/uL — ABNORMAL HIGH (ref 4.0–10.5)
nRBC: 0.1 % (ref 0.0–0.2)

## 2020-12-25 LAB — MAGNESIUM: Magnesium: 1.8 mg/dL (ref 1.7–2.4)

## 2020-12-25 MED ORDER — LACTATED RINGERS IV BOLUS
1000.0000 mL | Freq: Once | INTRAVENOUS | Status: AC
  Administered 2020-12-25: 1000 mL via INTRAVENOUS

## 2020-12-25 MED ORDER — SODIUM CHLORIDE 0.9 % IV SOLN
2.0000 g | INTRAVENOUS | Status: DC
  Administered 2020-12-25 – 2020-12-26 (×2): 2 g via INTRAVENOUS
  Filled 2020-12-25 (×2): qty 2

## 2020-12-25 MED ORDER — HEPARIN SODIUM (PORCINE) 5000 UNIT/ML IJ SOLN
5000.0000 [IU] | Freq: Three times a day (TID) | INTRAMUSCULAR | Status: DC
  Administered 2020-12-25 – 2020-12-28 (×10): 5000 [IU] via SUBCUTANEOUS
  Filled 2020-12-25 (×10): qty 1

## 2020-12-25 NOTE — Progress Notes (Signed)
eLink Physician-Brief Progress Note Patient Name: Stanley Scott DOB: February 06, 1951 MRN: [id number]   Date of Service  12/25/2020  HPI/Events of Note  RPH: chat: Blood culture growing E. coli. Currently on Vanc/Cefepime and Flagyl (CDiff + 12/28). Consider narrowing Vanc/Cefepime to Rocephin 2 g IV q24h, unless concern for asp PNA (mentioned in note by Stanley Scott) then continue Vanc? Also, does Flagyl need to continue?     eICU Interventions  Continue all, cl diff suspected? And aspiration also. AM team to re evaluate for de escalation of antibiotics.      Intervention Category Intermediate Interventions: Other:  Stanley Scott 12/25/2020, 1:13 AM

## 2020-12-25 NOTE — Progress Notes (Addendum)
NAME:  Stanley Scott, MRN:  [id number], DOB:  03-22-1951, LOS: 20 ADMISSION DATE:  11/20/2020, CONSULTATION DATE: 24 December 2020 REFERRING MD: Dr. Robb Matar, CHIEF COMPLAINT: Elevated lactic acid  Brief History:  67 yoM originally admitted for Cdiff and dehydration with hospitalization complicated by progressive quadriparesis noted to have severe cervical stenosis with cord compression; not surgical candidate treated with steroids.  Developed septic shock overnight 1/14 and moved to ICU for pressor support.  Found to have acute cholecystitis, and went for perc drain in IR 1/15.    History of Present Illness:  70 year old male that originally presented the emergency room with electrolyte disturbances.  Patient was found to have C. difficile colitis and was admitted to the medical floor.  During the patient's hospital stay he had progressive quadriparesis.  MRI of the cervical spine demonstrated severe stenosis.  Neurosurgery had been consulted but he was not considered a neurosurgical candidate secondary to his comorbidities.  He also has a known unstageable sacral decubiti.  During this evening the patient became hypotensive and started complaining of generalized pain.  Lactic acid was obtained and found to be greater than 8.  Patient is a DNR but wishes concerning vasoactive medications are unclear at this time.  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 Coli  1/15 bile aspirate >>  Antimicrobials:  12/27 ceftriaxone; 1/16 >> 12/28 vanc >> 1/7; 1/14 >> 1/16 1/14 Cefepime >>1/16 1/15 Flagyl > 1/16  Interim History / Subjective:  S/p perc cholecystostomy tube in IR 1/15 Improved mental status today- awake, moaning, few words, intermittently f/c Remains on NE 10  mcg/min CVP 7  Objective   Blood pressure 97/61, pulse 95, temperature (!) 97.4 F (36.3 C), temperature source Axillary, resp. rate 16, height 5\' 5"  (1.651 m), weight 47.2 kg, SpO2 100 %. CVP:  [0 mmHg-7 mmHg] 7 mmHg      Intake/Output Summary (Last 24 hours) at 12/25/2020 0914 Last data filed at 12/25/2020 0600 Gross per 24 hour  Intake 3789.83 ml  Output 4200 ml  Net -410.17 ml   Filed Weights   12/21/20 0300 12/23/20 0500 12/24/20 0439  Weight: 52.9 kg 48.5 kg 47.2 kg   Examination: General:  Chronically ill male lying in bed in NAD HEENT: MM pink/moist, pupils 3/reactive, poor dentition Neuro: Opens eyes to names, tracks and intermittently follows commands at times, moves BUE spont, able to wiggle toes CV: rr, no murmur PULM:  Non labored, coarse, slight rales left base GI: soft, +bs, no guarding, Right biliary drain, ubag Extremities: warm/dry, no edema  Skin: no rashes   -256 ml/ net -8.2 L Afebrile Labs reviewed  Assessment & Plan:  Septic shock secondary to acute cholecystitis  E. Coli bacteremia  Elevated LFTs/ t. bili - s/p perc cholecystostomy tube in IR 1/15 - narrow abx to CTX - continue NE for MAP goal > 65 - trend CVP, give additional LR bolus 1L now, may need to repeat, still net negative 8.2L - will d/c PICC and go back to PIV when pressor requirement resolved - cortisol level 20.8 - strict I/Os/ UOP - improving leukocytosis, continue to trend fever curve/ WBC - trend LFTs/ coags    Encephalopathy - likely due to sepsis, slowly improving - trend  neuro exams    LLL opacity- concern for developing aspiration vs atelectasis  - CTX as above - aspiration precautions  - prn supplemental O2, goal sats > 92% - NPO till mentals status improves further   Sacral decub - wound care - maximize nutrition when able    C. difficile colitis - no further diarrhea; completed oral vanc 1/7   Cervical spine stenosis - not a surgical candidate per NSGY   - trend neuro exams    AKI- resolved  - continue to trend renal indices/ UOP    Hypoglycemia - secondary to sepsis/ elevated LFTs - d/c D5NS - trend CBGs, d/c SSI   Best practice (evaluated daily)  Diet: NPO for now, may need to consider cortrak if unable to start PO's soon Pain/Anxiety/Delirium protocol (if indicated): prn fentanyl  VAP protocol (if indicated):n/a DVT prophylaxis: SCD, add heparin SQ GI prophylaxis:  Protonix Glucose control: Mobility: Bedrest Disposition: ICU  Goals of Care:   Code Status: DNR/ DNI, except pressors  Family update: Randy updated 1/15; pending for 1/16-> attempted to call/ update son Harvie Heck, no answer, voicemail not set up  Labs   CBC: Recent Labs  Lab 12/20/20 0210 12/21/20 0340 12/22/20 0317 12/24/20 0645 12/25/20 0119  WBC 20.8* 17.4* 14.6* 50.4* 36.0*  NEUTROABS  --  15.5*  --  47.1*  --   HGB 9.9* 9.2* 9.7* 7.8* 9.4*  HCT 30.5* 29.7* 30.8* 25.0* 30.7*  MCV 90.8 93.4 91.7 95.8 93.6  PLT 146* 136* 138* 117* 117*    Basic Metabolic Panel: Recent Labs  Lab 12/19/20 0800 12/21/20 0340 12/24/20 0337 12/24/20 0645 12/25/20 0119  NA 138 135 133* 134* 138  K 4.0 4.5 4.8 4.3 3.7  CL 104 103 99 98 102  CO2 27 22 16* 16* 22  GLUCOSE 110* 211* 78 59* 130*  BUN 18 17 39* 39* 24*  CREATININE 0.51* 0.59* 1.41* 1.39* 0.82  CALCIUM 8.6* 8.6* 8.9 8.3* 8.4*  MG  --   --  1.6*  --  1.8  PHOS  --   --  5.0*  --   --    GFR: Estimated Creatinine Clearance: 56.8 mL/min (by C-G formula based on SCr of 0.82 mg/dL). Recent Labs  Lab 12/21/20 0340 12/22/20 0317 12/24/20 0337 12/24/20 0635 12/24/20 0645 12/25/20 0119  PROCALCITON  --   --   --   --  53.52  --   WBC 17.4* 14.6*  --   --  50.4* 36.0*  LATICACIDVEN  --   --  8.3* 7.0*  --   --     Liver Function Tests: Recent Labs  Lab 12/24/20 0337 12/24/20 0645 12/25/20 0119  AST 357* 353* 357*  ALT 195* 191* 243*  ALKPHOS 429* 334* 295*  BILITOT 1.8* 1.8* 2.3*  PROT  5.2* 4.8* 5.3*  ALBUMIN 1.9* 1.7* 1.8*   No results for input(s): LIPASE, AMYLASE in the last 168 hours. No results for input(s): AMMONIA in the last 168 hours.  ABG No results found for: PHART, PCO2ART, PO2ART, HCO3, TCO2, ACIDBASEDEF, O2SAT   Coagulation Profile: Recent Labs  Lab 12/24/20 1438  INR 1.5*    Cardiac Enzymes: No results for input(s): CKTOTAL, CKMB, CKMBINDEX, TROPONINI in the last 168 hours.  HbA1C: Hgb A1c MFr Bld  Date/Time Value Ref Range Status  12/12/2020 11:20 AM 5.7 (H) 4.8 - 5.6 % Final    Comment:    (NOTE) Pre diabetes:  5.7%-6.4%  Diabetes:              >6.4%  Glycemic control for   <7.0% adults with diabetes     CBG: Recent Labs  Lab 12/24/20 1532 12/24/20 1935 12/24/20 2354 12/25/20 0329 12/25/20 0800  GLUCAP 98 117* 132* 125* 130*    Critical care time: 35 minutes    Posey Boyer, ACNP [facility] 12/25/2020, 9:14 AM

## 2020-12-25 NOTE — Progress Notes (Signed)
PHARMACY - PHYSICIAN COMMUNICATION CRITICAL VALUE ALERT - BLOOD CULTURE IDENTIFICATION (BCID)  Stanley Scott is an 70 y.o. male who presented to [facility] on 12-20-2020 with a chief complaint of sepsis/cholecystitis, s/p cholecystostomy tube   Assessment:   1/2 blood cultures growing E. Coli,  Name of physician (or Provider) Contacted:  Dr. Marlane Mingle Current antibiotics:  Vancomycin and Cefepime and Flagyl  Changes to prescribed antibiotics recommended:  Consider narrowing Vancomycin and Cefepime to Rocephin 2 g IV q24h  Results for orders placed or performed during the hospital encounter of 20-Dec-2020  Blood Culture ID Panel (Reflexed) (Collected: 12/24/2020  6:35 AM)  Result Value Ref Range   Enterococcus faecalis NOT DETECTED NOT DETECTED   Enterococcus Faecium NOT DETECTED NOT DETECTED   Listeria monocytogenes NOT DETECTED NOT DETECTED   Staphylococcus species NOT DETECTED NOT DETECTED   Staphylococcus aureus (BCID) NOT DETECTED NOT DETECTED   Staphylococcus epidermidis NOT DETECTED NOT DETECTED   Staphylococcus lugdunensis NOT DETECTED NOT DETECTED   Streptococcus species NOT DETECTED NOT DETECTED   Streptococcus agalactiae NOT DETECTED NOT DETECTED   Streptococcus pneumoniae NOT DETECTED NOT DETECTED   Streptococcus pyogenes NOT DETECTED NOT DETECTED   A.calcoaceticus-baumannii NOT DETECTED NOT DETECTED   Bacteroides fragilis NOT DETECTED NOT DETECTED   Enterobacterales DETECTED (A) NOT DETECTED   Enterobacter cloacae complex NOT DETECTED NOT DETECTED   Escherichia coli DETECTED (A) NOT DETECTED   Klebsiella aerogenes NOT DETECTED NOT DETECTED   Klebsiella oxytoca NOT DETECTED NOT DETECTED   Klebsiella pneumoniae NOT DETECTED NOT DETECTED   Proteus species NOT DETECTED NOT DETECTED   Salmonella species NOT DETECTED NOT DETECTED   Serratia marcescens NOT DETECTED NOT DETECTED   Haemophilus influenzae NOT DETECTED NOT DETECTED   Neisseria meningitidis NOT DETECTED NOT  DETECTED   Pseudomonas aeruginosa NOT DETECTED NOT DETECTED   Stenotrophomonas maltophilia NOT DETECTED NOT DETECTED   Candida albicans NOT DETECTED NOT DETECTED   Candida auris NOT DETECTED NOT DETECTED   Candida glabrata NOT DETECTED NOT DETECTED   Candida krusei NOT DETECTED NOT DETECTED   Candida parapsilosis NOT DETECTED NOT DETECTED   Candida tropicalis NOT DETECTED NOT DETECTED   Cryptococcus neoformans/gattii NOT DETECTED NOT DETECTED   CTX-M ESBL NOT DETECTED NOT DETECTED   Carbapenem resistance IMP NOT DETECTED NOT DETECTED   Carbapenem resistance KPC NOT DETECTED NOT DETECTED   Carbapenem resistance NDM NOT DETECTED NOT DETECTED   Carbapenem resist OXA 48 LIKE NOT DETECTED NOT DETECTED   Carbapenem resistance VIM NOT DETECTED NOT DETECTED    Eddie Candle 12/25/2020  1:00 AM

## 2020-12-26 ENCOUNTER — Inpatient Hospital Stay (HOSPITAL_COMMUNITY): Payer: Medicare Other

## 2020-12-26 DIAGNOSIS — A0472 Enterocolitis due to Clostridium difficile, not specified as recurrent: Secondary | ICD-10-CM | POA: Diagnosis not present

## 2020-12-26 LAB — CBC
HCT: 26.3 % — ABNORMAL LOW (ref 39.0–52.0)
Hemoglobin: 8.5 g/dL — ABNORMAL LOW (ref 13.0–17.0)
MCH: 29 pg (ref 26.0–34.0)
MCHC: 32.3 g/dL (ref 30.0–36.0)
MCV: 89.8 fL (ref 80.0–100.0)
Platelets: 74 10*3/uL — ABNORMAL LOW (ref 150–400)
RBC: 2.93 MIL/uL — ABNORMAL LOW (ref 4.22–5.81)
RDW: 21.7 % — ABNORMAL HIGH (ref 11.5–15.5)
WBC: 22.2 10*3/uL — ABNORMAL HIGH (ref 4.0–10.5)
nRBC: 0 % (ref 0.0–0.2)

## 2020-12-26 LAB — BASIC METABOLIC PANEL
Anion gap: 11 (ref 5–15)
BUN: 13 mg/dL (ref 8–23)
CO2: 23 mmol/L (ref 22–32)
Calcium: 8.2 mg/dL — ABNORMAL LOW (ref 8.9–10.3)
Chloride: 103 mmol/L (ref 98–111)
Creatinine, Ser: 0.53 mg/dL — ABNORMAL LOW (ref 0.61–1.24)
GFR, Estimated: >60 mL/min (ref >60)
Glucose, Bld: 133 mg/dL — ABNORMAL HIGH (ref 70–99)
Potassium: 3.7 mmol/L (ref 3.5–5.1)
Sodium: 137 mmol/L (ref 135–145)

## 2020-12-26 LAB — GLUCOSE, CAPILLARY
Glucose-Capillary: 121 mg/dL — ABNORMAL HIGH (ref 70–99)
Glucose-Capillary: 126 mg/dL — ABNORMAL HIGH (ref 70–99)
Glucose-Capillary: 118 mg/dL — ABNORMAL HIGH (ref 70–99)
Glucose-Capillary: 111 mg/dL — ABNORMAL HIGH (ref 70–99)
Glucose-Capillary: 91 mg/dL (ref 70–99)
Glucose-Capillary: 94 mg/dL (ref 70–99)

## 2020-12-26 LAB — COMPREHENSIVE METABOLIC PANEL
ALT: 146 U/L — ABNORMAL HIGH (ref 0–44)
AST: 157 U/L — ABNORMAL HIGH (ref 15–41)
Albumin: 1.6 g/dL — ABNORMAL LOW (ref 3.5–5.0)
Alkaline Phosphatase: 252 U/L — ABNORMAL HIGH (ref 38–126)
Anion gap: 11 (ref 5–15)
BUN: 14 mg/dL (ref 8–23)
CO2: 23 mmol/L (ref 22–32)
Calcium: 8.3 mg/dL — ABNORMAL LOW (ref 8.9–10.3)
Chloride: 102 mmol/L (ref 98–111)
Creatinine, Ser: 0.57 mg/dL — ABNORMAL LOW (ref 0.61–1.24)
GFR, Estimated: >60 mL/min (ref >60)
Glucose, Bld: 118 mg/dL — ABNORMAL HIGH (ref 70–99)
Potassium: 2.9 mmol/L — ABNORMAL LOW (ref 3.5–5.1)
Sodium: 136 mmol/L (ref 135–145)
Total Bilirubin: 1.6 mg/dL — ABNORMAL HIGH (ref 0.3–1.2)
Total Protein: 5.2 g/dL — ABNORMAL LOW (ref 6.5–8.1)

## 2020-12-26 LAB — CULTURE, BLOOD (ROUTINE X 2)

## 2020-12-26 LAB — MAGNESIUM
Magnesium: 1.7 mg/dL (ref 1.7–2.4)
Magnesium: 2 mg/dL (ref 1.7–2.4)

## 2020-12-26 LAB — PHOSPHORUS
Phosphorus: 2.7 mg/dL (ref 2.5–4.6)
Phosphorus: 2.5 mg/dL (ref 2.5–4.6)

## 2020-12-26 MED ORDER — POTASSIUM CHLORIDE 10 MEQ/100ML IV SOLN
10.0000 meq | INTRAVENOUS | Status: AC
  Administered 2020-12-26 (×6): 10 meq via INTRAVENOUS
  Filled 2020-12-26 (×5): qty 100

## 2020-12-26 MED ORDER — CEFAZOLIN SODIUM-DEXTROSE 2-4 GM/100ML-% IV SOLN
2.0000 g | Freq: Three times a day (TID) | INTRAVENOUS | Status: DC
  Administered 2020-12-27 – 2020-12-28 (×5): 2 g via INTRAVENOUS
  Filled 2020-12-26 (×8): qty 100

## 2020-12-26 MED ORDER — JUVEN PO PACK
1.0000 | PACK | Freq: Two times a day (BID) | ORAL | Status: DC
  Administered 2020-12-27 – 2020-12-28 (×3): 1
  Filled 2020-12-26 (×7): qty 1

## 2020-12-26 MED ORDER — LACTATED RINGERS IV BOLUS
500.0000 mL | Freq: Once | INTRAVENOUS | Status: AC
  Administered 2020-12-26: 500 mL via INTRAVENOUS

## 2020-12-26 MED ORDER — THIAMINE HCL 100 MG PO TABS
100.0000 mg | ORAL_TABLET | Freq: Every day | ORAL | Status: DC
  Administered 2020-12-28: 100 mg
  Filled 2020-12-26: qty 1

## 2020-12-26 MED ORDER — ASPIRIN 81 MG PO CHEW
81.0000 mg | CHEWABLE_TABLET | Freq: Every day | ORAL | Status: DC
  Administered 2020-12-28: 81 mg
  Filled 2020-12-26: qty 1

## 2020-12-26 MED ORDER — DAKINS (1/4 STRENGTH) 0.125 % EX SOLN
Freq: Every day | CUTANEOUS | Status: DC
  Filled 2020-12-26: qty 473

## 2020-12-26 MED ORDER — POTASSIUM CHLORIDE 10 MEQ/100ML IV SOLN
10.0000 meq | INTRAVENOUS | Status: DC
  Filled 2020-12-26: qty 100

## 2020-12-26 MED ORDER — FOLIC ACID 1 MG PO TABS
1.0000 mg | ORAL_TABLET | Freq: Every day | ORAL | Status: DC
  Administered 2020-12-28: 1 mg
  Filled 2020-12-26: qty 1

## 2020-12-26 MED ORDER — VITAL AF 1.2 CAL PO LIQD
1000.0000 mL | ORAL | Status: DC
  Administered 2020-12-27: 1000 mL
  Filled 2020-12-26 (×3): qty 1000

## 2020-12-26 MED ORDER — MAGNESIUM SULFATE 2 GM/50ML IV SOLN
2.0000 g | Freq: Once | INTRAVENOUS | Status: AC
  Administered 2020-12-26: 2 g via INTRAVENOUS
  Filled 2020-12-26: qty 50

## 2020-12-26 MED ORDER — ADULT MULTIVITAMIN W/MINERALS CH
1.0000 | ORAL_TABLET | Freq: Every day | ORAL | Status: DC
  Administered 2020-12-28: 1
  Filled 2020-12-26: qty 1

## 2020-12-26 MED ORDER — PANTOPRAZOLE SODIUM 40 MG PO PACK
40.0000 mg | PACK | Freq: Every day | ORAL | Status: DC
  Administered 2020-12-27: 40 mg
  Filled 2020-12-26 (×2): qty 20

## 2020-12-26 NOTE — Progress Notes (Signed)
Nutrition Follow-up  DOCUMENTATION CODES:   Severe malnutrition in context of chronic illness,Underweight  INTERVENTION:   Initiate tube feeding via NG tube: Vital AF 1.2 at 25 ml/h and increase by 10 ml every 8 hours to goal rate of 65 ml/h (1560 ml per day)  MVI with minerals daily  Juven BID  Provides 1872 kcal, 117 gm protein, 1265 ml free water daily  Monitor magnesium and phosphorus every 12 hours x 4 occurances, MD to replete as needed, as pt is at risk for refeeding syndrome given hx severe malnutrition.   NUTRITION DIAGNOSIS:   Severe Malnutrition related to chronic illness as evidenced by moderate fat depletion,severe fat depletion,moderate muscle depletion,severe muscle depletion,percent weight loss.  Ongoing   GOAL:   Patient will meet greater than or equal to 90% of their needs  Progressing.   MONITOR:   TF tolerance,Labs  REASON FOR ASSESSMENT:   Consult Enteral/tube feeding initiation and management  ASSESSMENT:   70 year old male admitted with acute diarrhea presented with multiple loose stools, electrolyte abnormalities, hypothermic, and low blood pressures. 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  Spoke with RN, plan for NG today with placement of cortrak 1/18  Weight has trended down frmo a high of 127 lb to 104 lb 1/15  1/15  tx to ICU due to evolving septic shock, acute cholecystitis; started on pressors s/p percutaneous cholecystostomy tube placement by IR; NPO   Medications reviewed and include: folic acid, MVI with minerals, Juven BID, thiamine  Levophed @ 2 mcg KCl 10 mEq IV every hour x 6  Mag sulfate x 1  Labs reviewed: K+ 2.9, magnesium 1.7 CBG's: 106-126  Biliary drain: 50 ml  Diet Order:   Diet Order            Diet NPO time specified   Diet effective now                 EDUCATION NEEDS:   Education needs have been addressed  Skin:  Skin Assessment: Skin Integrity Issues: Skin Integrity Issues:: Stage II,Other (Comment) Stage II: sacrum Other: MASD left;right buttocks  Last BM:  1/15  Height:   Ht Readings from Last 1 Encounters:  12/07/20 5\' 5"  (1.651 m)    Weight:   Wt Readings from Last 1 Encounters:  12/24/20 47.2 kg    Ideal Body Weight:     BMI:  Body mass index is 17.31 kg/m.  Estimated Nutritional Needs:   Kcal:  1800-2000  Protein:  100-115 grams  Fluid:  > 1.8 L  Arion Morgan P., RD, LDN, CNSC See AMiON for contact information

## 2020-12-26 NOTE — Progress Notes (Signed)
Referring Physician(s): Critical Care  Supervising Physician: Gilmer Mor  Patient Status:  [facility] - In-pt  Chief Complaint: Septic shock secondary to acute cholecystitis and E. Coli bacteremia; S/p drain placed 12/24/20  Subjective: Patient in bed, eyes open, moaning and crying out; unable to understand what patient is saying. RN at the bedside with pain medicine.   Allergies: Patient has no allergy information on record.  Medications: Prior to Admission medications   Medication Sig Start Date End Date Taking? Authorizing Provider  acetaminophen (TYLENOL) 500 MG tablet Take 500 mg by mouth every 6 (six) hours as needed for moderate pain or headache.   Yes [provider]  atorvastatin (LIPITOR) 40 MG tablet Take 40 mg by mouth daily. 10/08/20  Yes [provider]  cephALEXin (KEFLEX) 500 MG capsule Take 1 capsule (500 mg total) by mouth 4 (four) times daily. 11/25/20  Yes Vanetta Mulders, MD  ergocalciferol (VITAMIN D2) 1.25 MG (50000 UT) capsule Take 50,000 Units by mouth once a week. 10/08/20  Yes [provider]  folic acid (FOLVITE) 1 MG tablet Take 1 mg by mouth daily. 10/08/20  Yes [provider]  metoprolol tartrate (LOPRESSOR) 50 MG tablet Take 50 mg by mouth 2 (two) times daily. 11/30/20  Yes [provider]  pantoprazole (PROTONIX) 40 MG tablet Take 40 mg by mouth daily. 10/08/20  Yes [provider]  sodium chloride 1 g tablet Take 1 g by mouth 2 (two) times daily with a meal. 10/08/20  Yes [provider]  thiamine 100 MG tablet Take 100 mg by mouth daily. Patient not taking: No sig reported 10/08/20   [provider]     Vital Signs: BP 119/65   Pulse (!) 107   Temp (!) 93.6 F (34.2 C) (Rectal)   Resp (!) 25   Ht 5\' 5"  (1.651 m)   Wt 104 lb (47.2 kg)   SpO2 100%   BMI 17.31 kg/m   Physical Exam Constitutional:      General: He is in acute distress.     Appearance: He is  ill-appearing.     Comments: Moaning and crying out, warming blanket in place.   Cardiovascular:     Rate and Rhythm: Tachycardia present.  Abdominal:     Comments: RUQ percutaneous cholecystostomy drain in place. Approximately 50 ml of light bile in gravity bag. Easily flushed, dressing is clean and dry.   Skin:    General: Skin is warm and dry.  Neurological:     Mental Status: He is alert. He is disoriented.     Imaging: CT ABDOMEN PELVIS WO CONTRAST  Result Date: 12/24/2020 CLINICAL DATA:  Sepsis. EXAM: CT ABDOMEN AND PELVIS WITHOUT CONTRAST TECHNIQUE: Multidetector CT imaging of the abdomen and pelvis was performed following the standard protocol without IV contrast. COMPARISON:  None. FINDINGS: Lower chest: Mild left basilar opacity is noted concerning for atelectasis or possibly infiltrate. Hepatobiliary: Gallbladder is distended with cholelithiasis. Significant amount of inflammatory changes noted around the gallbladder concerning for cholecystitis. No biliary dilatation is noted. The liver is unremarkable. Pancreas: Unremarkable. No pancreatic ductal dilatation or surrounding inflammatory changes. Spleen: Normal in size without focal abnormality. Adrenals/Urinary Tract: Adrenal glands and kidneys appear normal. No hydronephrosis or renal obstruction is noted. No renal or ureteral calculi are noted. Mild urinary bladder distention is noted. Stomach/Bowel: The stomach appears normal. There is no evidence of bowel obstruction or inflammation. The appendix is not clearly visualized. Vascular/Lymphatic: Aortic atherosclerosis. No enlarged abdominal  or pelvic lymph nodes. Reproductive: Prostate is unremarkable. Other: No abdominal wall hernia or abnormality. No abdominopelvic ascites. Musculoskeletal: No acute or significant osseous findings. IMPRESSION: 1. Gallbladder distention is noted with cholelithiasis and surrounding inflammatory changes, consistent with acute cholecystitis. Ultrasound may  be performed for further evaluation. These results will be called to the ordering clinician or representative by the Radiologist Assistant, and communication documented in the PACS or zVision Dashboard. 2. Mild left basilar opacity is noted concerning for atelectasis or possibly infiltrate. 3. Mild urinary bladder distention is noted. Aortic Atherosclerosis (ICD10-I70.0). Electronically Signed   By: Lupita Raider M.D.   On: 12/24/2020 13:23   IR Perc Cholecystostomy  Result Date: 12/25/2020 INDICATION: 70 year old with sepsis and acute calculus cholecystitis. Patient needs source control for sepsis management. EXAM: PERCUTANEOUS CHOLECYSTOSTOMY TUBE PLACEMENT WITH ULTRASOUND AND FLUOROSCOPIC GUIDANCE MEDICATIONS: Patient was already receiving inpatient antibiotics and no additional antibiotics were given for the procedure. ANESTHESIA/SEDATION: Moderate (conscious) sedation was employed during this procedure. A total of Versed 0.0 mg and Fentanyl 50 mcg was administered intravenously. Moderate Sedation Time: 11 minutes. The patient's level of consciousness and vital signs were monitored continuously by radiology nursing throughout the procedure under my direct supervision. FLUOROSCOPY TIME:  Fluoroscopy Time: 54 seconds, 3 mGy COMPLICATIONS: None immediate. PROCEDURE: Informed telephone consent was obtained from the patient's son after a thorough discussion of the procedural risks, benefits and alternatives. All questions were addressed. A timeout was performed prior to the initiation of the procedure. Patient was placed supine. The right abdomen was prepped and draped in sterile fashion. Maximal barrier sterile technique was utilized including caps, mask, sterile gowns, sterile gloves, sterile drape, hand hygiene and skin antiseptic. Skin was anesthetized using 1% lidocaine. Using ultrasound guidance, a 21 gauge needle was directed into the distended gallbladder. A trans peritoneal approach was selected rather  than a transhepatic approach based on the gallbladder position. 0.018 wire was placed. A transitional dilator set was placed. Superstiff Amplatz wire was advanced into the gallbladder and the tract was dilated to accommodate a 10 [place] multipurpose drain. Approximately 80 mL of brown fluid was aspirated. The gallbladder was decompressed. Drain was flushed with saline and attached to a gravity bag. Fluid sample was sent for culture. Catheter was sutured to skin and a dressing was placed. FINDINGS: The gallbladder was markedly distended with thick wall and shadowing gallstone. Drain was successfully placed in the gallbladder. 80 mL of brown fluid was removed. Gallbladder was decompressed based on ultrasound at the end of the procedure. IMPRESSION: Successful percutaneous cholecystostomy tube placement with ultrasound and fluoroscopic guidance. Electronically Signed   By: Richarda Overlie M.D.   On: 12/25/2020 08:06   DG Chest Port 1 View  Result Date: 12/24/2020 CLINICAL DATA:  Leukocytosis. EXAM: PORTABLE CHEST 1 VIEW COMPARISON:  12/15/2020. FINDINGS: There is airspace opacity in the left lung base, retrocardiac region, new since the prior study. There is from a linear type opacity in the left mid lung similar to the prior exam consistent with scarring. Milder opacity is noted at the medial right lung base, not convincingly changed from the prior exam, allowing for the patient's current positioning, rotated to the left. Remainder of the lungs is clear. No convincing pleural effusion and no pneumothorax. Cardiac silhouette normal in size.  No mediastinal or hilar masses. Skeletal structures are demineralized but grossly intact. IMPRESSION: 1. Airspace opacity at the left lung base concerning for pneumonia. 2. No other evidence of acute cardiopulmonary disease. Electronically Signed  By: Amie Portland M.D.   On: 12/24/2020 10:42   DG Abd Portable 1V  Result Date: 12/26/2020 CLINICAL DATA:  Nasogastric tube  placement EXAM: PORTABLE ABDOMEN - 1 VIEW COMPARISON:  Portable exam 1106 hours compared to CT abdomen and pelvis of 12/24/2020 FINDINGS: Tip of nasogastric tube is in RIGHT lower lobe; recommend withdrawal on replacement. Slight gaseous distention of stomach. Dilated small bowel loops new since 12/24/2020, with some gas in the ascending colon, could reflect ileus. Cholecystostomy tube present. Bibasilar atelectasis versus infiltrate. No acute osseous findings. IMPRESSION: Nasogastric tube is in the RIGHT lower lobe; recommend withdrawal and replacement. This was directly communicated with the patient's nurse Maralyn Sago in 4N MICU prior to dictation of this report, 1124 hours on 12/26/2020. Diffuse small bowel dilatation with some RIGHT colonic gas question ileus. Cholecystostomy tube. Electronically Signed   By: Ulyses Southward M.D.   On: 12/26/2020 11:26   [place] EKG SITE RITE  Result Date: 12/24/2020 If Site Rite image not attached, placement could not be confirmed due to current cardiac rhythm.   Labs:  CBC: Recent Labs    12/22/20 0317 12/24/20 0645 12/25/20 0119 12/26/20 0021  WBC 14.6* 50.4* 36.0* 22.2*  HGB 9.7* 7.8* 9.4* 8.5*  HCT 30.8* 25.0* 30.7* 26.3*  PLT 138* 117* 117* 74*    COAGS: Recent Labs    11/18/2020 1217 12/06/20 0405 12/24/20 1438  INR 1.2 1.2 1.5*  APTT 46* 48* 44*    BMP: Recent Labs    12/24/20 0645 12/25/20 0119 12/26/20 0021 12/26/20 1243  NA 134* 138 136 137  K 4.3 3.7 2.9* 3.7  CL 98 102 102 103  CO2 16* 22 23 23   GLUCOSE 59* 130* 118* 133*  BUN 39* 24* 14 13  CALCIUM 8.3* 8.4* 8.3* 8.2*  CREATININE 1.39* 0.82 0.57* 0.53*  GFRNONAA 55* >60 >60 >60    LIVER FUNCTION TESTS: Recent Labs    12/24/20 0337 12/24/20 0645 12/25/20 0119 12/26/20 0021  BILITOT 1.8* 1.8* 2.3* 1.6*  AST 357* 353* 357* 157*  ALT 195* 191* 243* 146*  ALKPHOS 429* 334* 295* 252*  PROT 5.2* 4.8* 5.3* 5.2*  ALBUMIN 1.9* 1.7* 1.8* 1.6*    Assessment and Plan:  Septic  shock secondary to acute cholecystitis and E. Coli bacteremia; S/p drain placed 12/24/20: Patient hypothermic with warming blanket in place. T. Bili and WBC levels decreasing. 50 ml drain output documented in Epic with another 50 ml in gravity bag.   IR recommends to continue flushing the drain and documenting the output each shift. Change the dressing daily or as needed. Other plans per primary teams; IR will continue to follow.   Electronically Signed: 12/26/20, AGACNP-BC [phone] 12/26/2020, 2:25 PM   I spent a total of 15 Minutes at the the patient's bedside AND on the patient's hospital floor or unit, greater than 50% of which was counseling/coordinating care for percutaneous cholecystostomy drain care.

## 2020-12-26 NOTE — Plan of Care (Signed)
  Problem: Fluid Volume: Goal: Hemodynamic stability will improve Outcome: Progressing   

## 2020-12-26 NOTE — Progress Notes (Signed)
NAME:  Stanley Scott, MRN:  [id number], DOB:  [birth year], LOS: 21 ADMISSION DATE:  12/02/2020, CONSULTATION DATE: 24 December 2020 REFERRING MD: Dr. Robb Matar, CHIEF COMPLAINT: Elevated lactic acid  Brief History:  24 yoM originally admitted for Cdiff and dehydration with hospitalization complicated by progressive quadriparesis noted to have severe cervical stenosis with cord compression; not surgical candidate treated with steroids.  Developed septic shock overnight 1/14 and moved to ICU for pressor support.  Found to have acute cholecystitis, and went for perc drain in IR 1/15.    History of Present Illness:  70 year old male that originally presented the emergency room with electrolyte disturbances.  Patient was found to have C. difficile colitis and was admitted to the medical floor.  During the patient's hospital stay he had progressive quadriparesis.  MRI of the cervical spine demonstrated severe stenosis.  Neurosurgery had been consulted but he was not considered a neurosurgical candidate secondary to his comorbidities.  He also has a known unstageable sacral decubiti.  During this evening the patient became hypotensive and started complaining of generalized pain.  Lactic acid was obtained and found to be greater than 8.  Patient is a DNR but wishes concerning vasoactive medications are unclear at this time.  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 Coli  1/15 bile aspirate >>  Antimicrobials:  12/27 ceftriaxone; 1/16 >> 12/28 vanc >> 1/7; 1/14 >> 1/16 1/14 Cefepime >>1/16 1/15 Flagyl > 1/16  Interim History / Subjective:  S/p perc cholecystostomy tube in IR 1/15 Improved vasopressor requirement and improved mental status. Not oriented to place or time No  diarrhea per nursing  Objective   Blood pressure 115/68, pulse 96, temperature (!) 96.2 F (35.7 C), temperature source Axillary, resp. rate (!) 28, height 5\' 5"  (1.651 m), weight 47.2 kg, SpO2 100 %. CVP:  [7 mmHg-11 mmHg] 9 mmHg      Intake/Output Summary (Last 24 hours) at 12/26/2020 0955 Last data filed at 12/26/2020 0900 Gross per 24 hour  Intake 3550.24 ml  Output 2100 ml  Net 1450.24 ml   Filed Weights   12/21/20 0300 12/23/20 0500 12/24/20 0439  Weight: 52.9 kg 48.5 kg 47.2 kg   Physical Exam: General: Chronically ill-appearing, no acute distress HENT: [place], AT, OP clear, MMM, poor dentition Eyes: EOMI, no scleral icterus Respiratory: Clear to auscultation bilaterally.  No crackles, wheezing or rales Cardiovascular: RRR, -M/R/G, no JVD GI: BS+, voluntary guarding, diffuse mild tenderness, right percutaneous cholecystectomy drain in place with bilious drainage Extremities:-Edema,-tenderness, muscle wasting Neuro: Opens eyes to voice, smiles, tracks and follows commands with upper extremities bilaterally   Assessment & Plan:  Septic shock secondary to acute cholecystitis and E. Coli bacteremia - improving leukocytosis and pressor requirement Elevated LFTs/ T. Bili -improving - s/p perc cholecystostomy tube in IR 1/15 - continue Ceftriaxone - continue NE for MAP goal > 65 - LR bolus now to facilitate weaning - will d/c PICC and go back to PIV when pressor requirement resolved - cortisol level 20.8 - strict I/Os/ UOP - trend LFTs/ coags   Acute Encephalopathy secondary to sepsis - likely due to sepsis, slowly improving - trend neuro exams   LLL opacity- concern for developing aspiration vs atelectasis  - CTX as above -  aspiration precautions  - prn supplemental O2, goal sats > 92% - NPO till mentals status improves further  Sacral decub - wound care - maximize nutrition when able   C. 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 Plan for cortrak tomorrow Pain/Anxiety/Delirium protocol (if indicated): prn fentanyl  VAP protocol (if indicated):n/a DVT prophylaxis: SCD, add heparin SQ GI prophylaxis:  Protonix Glucose control: Mobility: Bedrest Disposition: ICU  Goals of Care:   Code Status: DNR/ DNI, except pressors  Family update: Updated son on 1/17. Ok with temporary feeding tube  Labs   CBC: Recent Labs  Lab 12/21/20 0340 12/22/20 0317 12/24/20 0645 12/25/20 0119 12/26/20 0021  WBC 17.4* 14.6* 50.4* 36.0* 22.2*  NEUTROABS 15.5*  --  47.1*  --   --   HGB 9.2* 9.7* 7.8* 9.4* 8.5*  HCT 29.7* 30.8* 25.0* 30.7* 26.3*  MCV 93.4 91.7 95.8 93.6 89.8  PLT 136* 138* 117* 117* 74*    Basic Metabolic Panel: Recent Labs  Lab 12/21/20 0340 12/24/20 0337 12/24/20 0645 12/25/20 0119 12/26/20 0021  NA 135 133* 134* 138 136  K 4.5 4.8 4.3 3.7 2.9*  CL 103 99 98 102 102  CO2 22 16* 16* 22 23  GLUCOSE 211* 78 59* 130* 118*  BUN 17 39* 39* 24* 14  CREATININE 0.59* 1.41* 1.39* 0.82 0.57*  CALCIUM 8.6* 8.9 8.3* 8.4* 8.3*  MG  --  1.6*  --  1.8 1.7  PHOS  --  5.0*  --   --   --    GFR: Estimated Creatinine Clearance: 58.2 mL/min (A) (by C-G formula based on SCr of 0.57 mg/dL (L)). Recent Labs  Lab 12/22/20 0317 12/24/20 0337 12/24/20 0635 12/24/20 0645 12/25/20 0119 12/26/20 0021  PROCALCITON  --   --   --  53.52  --   --   WBC 14.6*  --   --  50.4* 36.0* 22.2*  LATICACIDVEN  --  8.3* 7.0*  --   --   --     Liver Function Tests: Recent Labs  Lab 12/24/20 0337 12/24/20 0645 12/25/20 0119 12/26/20 0021  AST 357* 353* 357* 157*  ALT 195* 191* 243* 146*  ALKPHOS 429* 334* 295* 252*  BILITOT 1.8* 1.8* 2.3* 1.6*  PROT 5.2* 4.8* 5.3* 5.2*  ALBUMIN 1.9* 1.7* 1.8* 1.6*   No results  for input(s): LIPASE, AMYLASE in the last 168 hours. No results for input(s): AMMONIA in the last 168 hours.  ABG No results found for: PHART, PCO2ART, PO2ART, HCO3, TCO2, ACIDBASEDEF, O2SAT   Coagulation Profile: Recent Labs  Lab 12/24/20 1438  INR 1.5*    Cardiac Enzymes: No results for input(s): CKTOTAL, CKMB, CKMBINDEX, TROPONINI in the last 168 hours.  HbA1C: Hgb A1c MFr Bld  Date/Time Value Ref Range Status  12/12/2020 11:20 AM 5.7 (H) 4.8 - 5.6 % Final    Comment:    (NOTE) Pre diabetes:          5.7%-6.4%  Diabetes:              >6.4%  Glycemic control for   <7.0% adults with diabetes     CBG: Recent Labs  Lab 12/25/20 1622 12/25/20 1923 12/25/20 2331 12/26/20 0327 12/26/20 0751  GLUCAP 90 90 106* 121* 126*    Critical care time: 31 minutes    The patient is critically ill with multiple organ systems failure and requires high complexity decision making for assessment and support, frequent evaluation and titration of therapies, application of advanced monitoring technologies and extensive interpretation of multiple databases.   Mechele Collin, M.D. [facility] Pulmonary/Critical Care Medicine 12/26/2020 9:55 AM   Please see Amion for pager number to reach on-call Pulmonary and Critical Care Team.

## 2020-12-26 NOTE — Consult Note (Signed)
WOC Nurse wound follow up Wound type:unstageable sacral wound. Ongoing decline Measurement: 6 cm  X 7 cm 100% thin slough to wound bed Wound bed:see above Drainage (amount, consistency, odor) moderate tan  Necrotic odor.  Periwound:moist and detached edges Dressing procedure/placement/frequency:Stop santyl. Cleanse sacral wound with Ns and pat dry. Apply Dakins moist gauze to wound bed. Cover with dry gauze secure with ABD and tape.  Change daily. Will reassess in 3 days.  Will follow.  Maple Hudson MSN, RN, FNP-BC CWON Wound, Ostomy, Continence Nurse Pager [phone]

## 2020-12-26 NOTE — Progress Notes (Signed)
eLink Physician-Brief Progress Note Patient Name: Elzy Tomasello DOB: 01-Nov-1951 MRN: [id number]   Date of Service  12/26/2020  HPI/Events of Note  AM K+ 2.9, creat 0.57 and GFR >60. Does not haven't orders to initiate CCM electrlyte protocol  eICU Interventions  - has PICC line. Kcl 10 meq, q1 hr x 6 doses and 2 gm Mag IV ordered. Notified bed side RN.      Intervention Category Intermediate Interventions: Electrolyte abnormality - evaluation and management  Ranee Gosselin 12/26/2020, 2:00 AM

## 2020-12-27 DIAGNOSIS — A0472 Enterocolitis due to Clostridium difficile, not specified as recurrent: Secondary | ICD-10-CM | POA: Diagnosis not present

## 2020-12-27 LAB — GLUCOSE, CAPILLARY
Glucose-Capillary: 115 mg/dL — ABNORMAL HIGH (ref 70–99)
Glucose-Capillary: 116 mg/dL — ABNORMAL HIGH (ref 70–99)
Glucose-Capillary: 97 mg/dL (ref 70–99)
Glucose-Capillary: 120 mg/dL — ABNORMAL HIGH (ref 70–99)

## 2020-12-27 LAB — MAGNESIUM
Magnesium: 1.9 mg/dL (ref 1.7–2.4)
Magnesium: 1.8 mg/dL (ref 1.7–2.4)

## 2020-12-27 LAB — PHOSPHORUS
Phosphorus: 2.7 mg/dL (ref 2.5–4.6)
Phosphorus: 2.8 mg/dL (ref 2.5–4.6)

## 2020-12-27 MED ORDER — METOPROLOL TARTRATE 5 MG/5ML IV SOLN: 2.5000 mg | Freq: Four times a day (QID) | INTRAVENOUS | Status: DC | PRN

## 2020-12-27 MED ORDER — ONDANSETRON HCL 4 MG/2ML IJ SOLN
INTRAMUSCULAR | Status: AC
  Filled 2020-12-27: qty 2

## 2020-12-27 MED ORDER — ONDANSETRON HCL 4 MG/2ML IJ SOLN
4.0000 mg | Freq: Four times a day (QID) | INTRAMUSCULAR | Status: DC | PRN
  Administered 2020-12-27 – 2020-12-28 (×3): 4 mg via INTRAVENOUS
  Filled 2020-12-27 (×2): qty 2

## 2020-12-27 MED ORDER — ACETAMINOPHEN 325 MG PO TABS
650.0000 mg | ORAL_TABLET | Freq: Four times a day (QID) | ORAL | Status: DC | PRN
  Administered 2020-12-27: 650 mg
  Filled 2020-12-27: qty 2

## 2020-12-27 NOTE — Plan of Care (Signed)
  Problem: Clinical Measurements: Goal: Ability to maintain clinical measurements within normal limits will improve Outcome: Progressing Goal: Will remain free from infection Outcome: Progressing Goal: Diagnostic test results will improve Outcome: Progressing Goal: Cardiovascular complication will be avoided Outcome: Progressing   

## 2020-12-27 NOTE — Progress Notes (Signed)
NAME:  Stanley Scott, MRN:  [id number], DOB:  May 08, 1951, LOS: 22 ADMISSION DATE:  12/01/2020, CONSULTATION DATE: 24 December 2020 REFERRING MD: Dr. Robb Matar, CHIEF COMPLAINT: Elevated lactic acid  Brief History:  47 yoM originally admitted for Cdiff and dehydration with hospitalization complicated by progressive quadriparesis noted to have severe cervical stenosis with cord compression; not surgical candidate treated with steroids.  Developed septic shock overnight 1/14 and moved to ICU for pressor support.  Found to have acute cholecystitis, and went for perc drain in IR 1/15.    History of Present Illness:  70 year old male that originally presented the emergency room with electrolyte disturbances.  Patient was found to have C. difficile colitis and was admitted to the medical floor.  During the patient's hospital stay he had progressive quadriparesis.  MRI of the cervical spine demonstrated severe stenosis.  Neurosurgery had been consulted but he was not considered a neurosurgical candidate secondary to his comorbidities.  He also has a known unstageable sacral decubiti.  Evening of 1/15 the patient became hypotensive and started complaining of generalized pain.  Lactic acid was obtained and found to be greater than 8.  Patient is a DNR but wishes concerning vasoactive medications are unclear at this time.  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 Coli  1/15 bile aspirate >>  Antimicrobials:  12/27 ceftriaxone; 1/16 >> 12/28 vanc >> 1/7; 1/14 >> 1/16 1/14 Cefepime >>1/16 1/15 Flagyl > 1/16  Interim History / Subjective:  Seen lying in bed encephalopathic  Pressor support stopped 1/17 RN report hypothermia requiring placement of warming blanket   Objective    Blood pressure 111/78, pulse 98, temperature (!) 97.5 F (36.4 C), temperature source Axillary, resp. rate (!) 29, height 5\' 5"  (1.651 m), weight 52.6 kg, SpO2 100 %.        Intake/Output Summary (Last 24 hours) at 12/27/2020 0817 Last data filed at 12/27/2020 0700 Gross per 24 hour  Intake 2007.85 ml  Output 345 ml  Net 1662.85 ml   Filed Weights   12/23/20 0500 12/24/20 0439 12/27/20 0500  Weight: 48.5 kg 47.2 kg 52.6 kg   Physical Exam: General: Chronically cachetic ill appearing male lying in bed in NAD HEENT: [place]/AT, MM pink/dry, PERRL,  Neuro: Nonsensical speak, purposeful movement in upper extremities, flicker of movement to lower extremities to painful stimuli   CV: s1s2 regular rate and rhythm, no murmur, rubs, or gallops,  PULM:  Clear to ascultation bilaterally, no increased work of breathing no added breath soinds GI: soft, bowel sounds active in all 4 quadrants, non-tender, non-distended Extremities: warm/dry, no edema  Skin: no rashes or lesions  Assessment & Plan:  Septic shock secondary to acute cholecystitis  -improving leukocytosis and pressor requirement -s/p perc cholecystostomy tube in IR 1/15 -cortisol level 20.8 E. Coli bacteremia   -Pansensitive  Elevated LFTs/ T. Bili  -improving P: Continue Cefazolin  Pressor support stopped 1/17 Closely monitor volume status  Strict I&O Consider removal of PICC line soon  Trend LFTs Flush biliary drain per IR recommendations    Acute Encephalopathy  -secondary to sepsis and progressing failure to thrive  P: Delirium precautions  Frequent neuro checks  Minimize sedation   LLL opacity -concern for developing aspiration vs atelectasis  P: Remains on RA Aspiration precautions  SLP eval for safe diet initiation when able  Encourage pulmonary hygiene   Unstageable Sacral decub -Last measurement per WOC 6 x 7cm  P: WOC following  Frequent turns  Pressure alleviating measures  Nutrition support as  able   C. difficile colitis - no further diarrhea; completed oral vanc 1/7 P: Supportive care   Cervical spine stenosis - not a surgical candidate per NSGY  P: Follow neuro exams  Supprotive care   Hypoglycemia - secondary to sepsis/ elevated LFTs P: Trend CBGs  Severe malnutrition  -As evidence by sever muscle and fat depletion P: Need to establish entral access Attempts made for NGT 1/17 but unsuccessful  Attempt Cortrack when able  Need to speak with family desire for PEG placement  GOC  Given patient progressive decline with evidence of failure to thrive. Further discussion on GOC need to be established. Re-consult to palliative are today. Plan to speak with family further on arrival later today.   Best practice (evaluated daily)  Diet: NPO. Plan for cortrak tomorrow Pain/Anxiety/Delirium protocol (if indicated): prn fentanyl  VAP protocol (if indicated):n/a DVT prophylaxis: SCD, add heparin SQ GI prophylaxis:  Protonix Glucose control: Mobility: Bedrest Disposition: ICU  Goals of Care:   Code Status: DNR/ DNI, except pressors  Family update:   Labs   CBC: Recent Labs  Lab 12/21/20 0340 12/22/20 0317 12/24/20 0645 12/25/20 0119 12/26/20 0021  WBC 17.4* 14.6* 50.4* 36.0* 22.2*  NEUTROABS 15.5*  --  47.1*  --   --   HGB 9.2* 9.7* 7.8* 9.4* 8.5*  HCT 29.7* 30.8* 25.0* 30.7* 26.3*  MCV 93.4 91.7 95.8 93.6 89.8  PLT 136* 138* 117* 117* 74*    Basic Metabolic Panel: Recent Labs  Lab 12/24/20 0337 12/24/20 0645 12/25/20 0119 12/26/20 0021 12/26/20 1000 12/26/20 1243 12/26/20 1716 12/27/20 0500  NA 133* 134* 138 136  --  137  --   --   K 4.8 4.3 3.7 2.9*  --  3.7  --   --   CL 99 98 102 102  --  103  --   --   CO2 16* 16* 22 23  --  23  --   --   GLUCOSE 78 59* 130* 118*  --  133*  --   --   BUN 39* 39* 24* 14  --  13  --   --   CREATININE 1.41* 1.39* 0.82 0.57*  --  0.53*  --   --   CALCIUM 8.9 8.3* 8.4* 8.3*  --  8.2*  --   --   MG 1.6*   --  1.8 1.7  --   --  2.0 1.9  PHOS 5.0*  --   --   --  2.7  --  2.5 2.7   GFR: Estimated Creatinine Clearance: 64.8 mL/min (A) (by C-G formula based on SCr of 0.53 mg/dL (L)). Recent Labs  Lab 12/22/20 0317 12/24/20 0337 12/24/20 0635 12/24/20 0645 12/25/20 0119 12/26/20 0021  PROCALCITON  --   --   --  53.52  --   --   WBC 14.6*  --   --  50.4* 36.0* 22.2*  LATICACIDVEN  --  8.3* 7.0*  --   --   --     Liver Function Tests: Recent Labs  Lab 12/24/20 0337 12/24/20 0645 12/25/20 0119 12/26/20 0021  AST 357* 353* 357* 157*  ALT 195* 191* 243* 146*  ALKPHOS 429* 334* 295*  252*  BILITOT 1.8* 1.8* 2.3* 1.6*  PROT 5.2* 4.8* 5.3* 5.2*  ALBUMIN 1.9* 1.7* 1.8* 1.6*   No results for input(s): LIPASE, AMYLASE in the last 168 hours. No results for input(s): AMMONIA in the last 168 hours.  ABG No results found for: PHART, PCO2ART, PO2ART, HCO3, TCO2, ACIDBASEDEF, O2SAT   Coagulation Profile: Recent Labs  Lab 12/24/20 1438  INR 1.5*    Cardiac Enzymes: No results for input(s): CKTOTAL, CKMB, CKMBINDEX, TROPONINI in the last 168 hours.  HbA1C: Hgb A1c MFr Bld  Date/Time Value Ref Range Status  12/12/2020 11:20 AM 5.7 (H) 4.8 - 5.6 % Final    Comment:    (NOTE) Pre diabetes:          5.7%-6.4%  Diabetes:              >6.4%  Glycemic control for   <7.0% adults with diabetes     CBG: Recent Labs  Lab 12/26/20 1524 12/26/20 1951 12/26/20 2316 12/27/20 0310 12/27/20 0741  GLUCAP 111* 91 94 115* 116*    Critical care time:    Performed by: Delfin Gant  Total critical care time: 38 minutes  Critical care time was exclusive of separately billable procedures and treating other patients.  Critical care was necessary to treat or prevent imminent or life-threatening deterioration.  Critical care was time spent personally by me on the following activities: development of treatment plan with patient and/or surrogate as well as nursing, discussions with  consultants, evaluation of patient's response to treatment, examination of patient, obtaining history from patient or surrogate, ordering and performing treatments and interventions, ordering and review of laboratory studies, ordering and review of radiographic studies, pulse oximetry and re-evaluation of patient's condition.  Delfin Gant, NP-C [facility] Contact / Pager information can be found on Amion  12/27/2020, 8:29 AM

## 2020-12-27 NOTE — Procedures (Signed)
Cortrak  Person Inserting Tube:  Caston Coopersmith, RD Tube Type:  Cortrak - 43 inches Tube Location:  Right nare Initial Placement:  Stomach Secured by: Bridle Technique Used to Measure Tube Placement:  Documented cm marking at nare/ corner of mouth Cortrak Secured At:  73 cm   No x-ray is required. RN may begin using tube.    If the tube becomes dislodged please keep the tube and contact the Cortrak team at [email or web address] (password [email or web address]) for replacement.  If after hours and replacement cannot be delayed, place a NG tube and confirm placement with an abdominal x-ray.    Firman Petrow RD, LDN Clinical Nutrition Pager listed in AMION    

## 2020-12-27 NOTE — Progress Notes (Signed)
Referring Physician(s): Ellison,J(CCM)  Supervising Physician: Gilmer Mor  Patient Status:  [facility] - In-pt  Chief Complaint:  cholecystitis  Subjective: Pt lying bed with occ yelling out/moans; thermal blanket in place, last recorded temp 95.1   Allergies: Patient has no allergy information on record.  Medications: Prior to Admission medications   Medication Sig Start Date End Date Taking? Authorizing Provider  acetaminophen (TYLENOL) 500 MG tablet Take 500 mg by mouth every 6 (six) hours as needed for moderate pain or headache.   Yes [provider]  atorvastatin (LIPITOR) 40 MG tablet Take 40 mg by mouth daily. 10/08/20  Yes [provider]  cephALEXin (KEFLEX) 500 MG capsule Take 1 capsule (500 mg total) by mouth 4 (four) times daily. 11/25/20  Yes Vanetta Mulders, MD  ergocalciferol (VITAMIN D2) 1.25 MG (50000 UT) capsule Take 50,000 Units by mouth once a week. 10/08/20  Yes [provider]  folic acid (FOLVITE) 1 MG tablet Take 1 mg by mouth daily. 10/08/20  Yes [provider]  metoprolol tartrate (LOPRESSOR) 50 MG tablet Take 50 mg by mouth 2 (two) times daily. 11/30/20  Yes [provider]  pantoprazole (PROTONIX) 40 MG tablet Take 40 mg by mouth daily. 10/08/20  Yes [provider]  sodium chloride 1 g tablet Take 1 g by mouth 2 (two) times daily with a meal. 10/08/20  Yes [provider]  thiamine 100 MG tablet Take 100 mg by mouth daily. Patient not taking: No sig reported 10/08/20   [provider]     Vital Signs: BP (!) 128/97   Pulse (!) 114   Temp (!) 95.1 F (35.1 C) (Rectal)   Resp (!) 32   Ht 5\' 5"  (1.651 m)   Wt 116 lb (52.6 kg)   SpO2 100%   BMI 19.30 kg/m   Physical Exam awake/eyes open; encephalopathic; GB drain in place, insertion site ok, OP 45 cc green bile  Imaging: CT ABDOMEN PELVIS WO CONTRAST  Result Date: 12/24/2020 CLINICAL DATA:  Sepsis. EXAM: CT ABDOMEN  AND PELVIS WITHOUT CONTRAST TECHNIQUE: Multidetector CT imaging of the abdomen and pelvis was performed following the standard protocol without IV contrast. COMPARISON:  None. FINDINGS: Lower chest: Mild left basilar opacity is noted concerning for atelectasis or possibly infiltrate. Hepatobiliary: Gallbladder is distended with cholelithiasis. Significant amount of inflammatory changes noted around the gallbladder concerning for cholecystitis. No biliary dilatation is noted. The liver is unremarkable. Pancreas: Unremarkable. No pancreatic ductal dilatation or surrounding inflammatory changes. Spleen: Normal in size without focal abnormality. Adrenals/Urinary Tract: Adrenal glands and kidneys appear normal. No hydronephrosis or renal obstruction is noted. No renal or ureteral calculi are noted. Mild urinary bladder distention is noted. Stomach/Bowel: The stomach appears normal. There is no evidence of bowel obstruction or inflammation. The appendix is not clearly visualized. Vascular/Lymphatic: Aortic atherosclerosis. No enlarged abdominal or pelvic lymph nodes. Reproductive: Prostate is unremarkable. Other: No abdominal wall hernia or abnormality. No abdominopelvic ascites. Musculoskeletal: No acute or significant osseous findings. IMPRESSION: 1. Gallbladder distention is noted with cholelithiasis and surrounding inflammatory changes, consistent with acute cholecystitis. Ultrasound may be performed for further evaluation. These results will be called to the ordering clinician or representative by the Radiologist Assistant, and communication documented in the PACS or zVision Dashboard. 2. Mild left basilar opacity is noted concerning for atelectasis or possibly infiltrate. 3. Mild urinary bladder distention is noted. Aortic Atherosclerosis (ICD10-I70.0). Electronically Signed   By: 12/26/2020 M.D.   On:  12/24/2020 13:23   IR Perc Cholecystostomy  Result Date: 12/25/2020 INDICATION: 70 year old with sepsis  and acute calculus cholecystitis. Patient needs source control for sepsis management. EXAM: PERCUTANEOUS CHOLECYSTOSTOMY TUBE PLACEMENT WITH ULTRASOUND AND FLUOROSCOPIC GUIDANCE MEDICATIONS: Patient was already receiving inpatient antibiotics and no additional antibiotics were given for the procedure. ANESTHESIA/SEDATION: Moderate (conscious) sedation was employed during this procedure. A total of Versed 0.0 mg and Fentanyl 50 mcg was administered intravenously. Moderate Sedation Time: 11 minutes. The patient's level of consciousness and vital signs were monitored continuously by radiology nursing throughout the procedure under my direct supervision. FLUOROSCOPY TIME:  Fluoroscopy Time: 54 seconds, 3 mGy COMPLICATIONS: None immediate. PROCEDURE: Informed telephone consent was obtained from the patient's son after a thorough discussion of the procedural risks, benefits and alternatives. All questions were addressed. A timeout was performed prior to the initiation of the procedure. Patient was placed supine. The right abdomen was prepped and draped in sterile fashion. Maximal barrier sterile technique was utilized including caps, mask, sterile gowns, sterile gloves, sterile drape, hand hygiene and skin antiseptic. Skin was anesthetized using 1% lidocaine. Using ultrasound guidance, a 21 gauge needle was directed into the distended gallbladder. A trans peritoneal approach was selected rather than a transhepatic approach based on the gallbladder position. 0.018 wire was placed. A transitional dilator set was placed. Superstiff Amplatz wire was advanced into the gallbladder and the tract was dilated to accommodate a 10 [place] multipurpose drain. Approximately 80 mL of brown fluid was aspirated. The gallbladder was decompressed. Drain was flushed with saline and attached to a gravity bag. Fluid sample was sent for culture. Catheter was sutured to skin and a dressing was placed. FINDINGS: The gallbladder was markedly  distended with thick wall and shadowing gallstone. Drain was successfully placed in the gallbladder. 80 mL of brown fluid was removed. Gallbladder was decompressed based on ultrasound at the end of the procedure. IMPRESSION: Successful percutaneous cholecystostomy tube placement with ultrasound and fluoroscopic guidance. Electronically Signed   By: Richarda Overlie M.D.   On: 12/25/2020 08:06   DG Chest Port 1 View  Result Date: 12/24/2020 CLINICAL DATA:  Leukocytosis. EXAM: PORTABLE CHEST 1 VIEW COMPARISON:  12/15/2020. FINDINGS: There is airspace opacity in the left lung base, retrocardiac region, new since the prior study. There is from a linear type opacity in the left mid lung similar to the prior exam consistent with scarring. Milder opacity is noted at the medial right lung base, not convincingly changed from the prior exam, allowing for the patient's current positioning, rotated to the left. Remainder of the lungs is clear. No convincing pleural effusion and no pneumothorax. Cardiac silhouette normal in size.  No mediastinal or hilar masses. Skeletal structures are demineralized but grossly intact. IMPRESSION: 1. Airspace opacity at the left lung base concerning for pneumonia. 2. No other evidence of acute cardiopulmonary disease. Electronically Signed   By: Amie Portland M.D.   On: 12/24/2020 10:42   DG Abd Portable 1V  Result Date: 12/26/2020 CLINICAL DATA:  Nasogastric tube placement EXAM: PORTABLE ABDOMEN - 1 VIEW COMPARISON:  Portable exam 1106 hours compared to CT abdomen and pelvis of 12/24/2020 FINDINGS: Tip of nasogastric tube is in RIGHT lower lobe; recommend withdrawal on replacement. Slight gaseous distention of stomach. Dilated small bowel loops new since 12/24/2020, with some gas in the ascending colon, could reflect ileus. Cholecystostomy tube present. Bibasilar atelectasis versus infiltrate. No acute osseous findings. IMPRESSION: Nasogastric tube is in the RIGHT lower lobe; recommend  withdrawal and replacement.  This was directly communicated with the patient's nurse Maralyn Sago in 4N MICU prior to dictation of this report, 1124 hours on 12/26/2020. Diffuse small bowel dilatation with some RIGHT colonic gas question ileus. Cholecystostomy tube. Electronically Signed   By: Ulyses Southward M.D.   On: 12/26/2020 11:26   [place] EKG SITE RITE  Result Date: 12/24/2020 If Site Rite image not attached, placement could not be confirmed due to current cardiac rhythm.   Labs:  CBC: Recent Labs    12/22/20 0317 12/24/20 0645 12/25/20 0119 12/26/20 0021  WBC 14.6* 50.4* 36.0* 22.2*  HGB 9.7* 7.8* 9.4* 8.5*  HCT 30.8* 25.0* 30.7* 26.3*  PLT 138* 117* 117* 74*    COAGS: Recent Labs    2020/12/27 1217 12/06/20 0405 12/24/20 1438  INR 1.2 1.2 1.5*  APTT 46* 48* 44*    BMP: Recent Labs    12/24/20 0645 12/25/20 0119 12/26/20 0021 12/26/20 1243  NA 134* 138 136 137  K 4.3 3.7 2.9* 3.7  CL 98 102 102 103  CO2 16* 22 23 23   GLUCOSE 59* 130* 118* 133*  BUN 39* 24* 14 13  CALCIUM 8.3* 8.4* 8.3* 8.2*  CREATININE 1.39* 0.82 0.57* 0.53*  GFRNONAA 55* >60 >60 >60    LIVER FUNCTION TESTS: Recent Labs    12/24/20 0337 12/24/20 0645 12/25/20 0119 12/26/20 0021  BILITOT 1.8* 1.8* 2.3* 1.6*  AST 357* 353* 357* 157*  ALT 195* 191* 243* 146*  ALKPHOS 429* 334* 295* 252*  PROT 5.2* 4.8* 5.3* 5.2*  ALBUMIN 1.9* 1.7* 1.8* 1.6*    Assessment and Plan: Septic shock secondary to acute cholecystitis and E. Coli bacteremia, prior C diff; S/p GB drain placement 12/24/20; cont current tx/monitor labs/OP; GB drain will need to remain in place at least 4-6 weeks unless GB removed surgically in interim; f/u cholangiogram will be performed in 4 -6 weeks; other plans as per CCM   Electronically Signed: D. 12/26/20, PA-C 12/27/2020, 10:26 AM   I spent a total of 15 minutes at the the patient's bedside AND on the patient's hospital floor or unit, greater than 50% of which was  counseling/coordinating care for gallbladder drain    Patient ID: 12/29/2020, male   DOB: 05-19-1951, 70 y.o.   MRN: [id number]

## 2020-12-27 NOTE — Progress Notes (Signed)
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 Pt found to have severe cervical spine stenosis on 12/30 MRI, transferred to [place].    PT Comments    On arrival, in his encephalopathic state, pt was making growling and honking-like sounds that had no apparent purpose.  Emphasis on totally ranging upper and LE's, trying to get spontaneous movement or resistance to movement, transition to sitting EOB, working to elicit truncal activity.  Unable to get a BP reading in sitting as patient was co-contracting his left arm throughout the BP cycle.   Follow Up Recommendations  SNF;Supervision/Assistance - 24 hour     Equipment Recommendations  Hospital bed;Wheelchair (measurements PT);Wheelchair cushion (measurements PT);Other (comment)    Recommendations for Other Services       Precautions / Restrictions Precautions Precautions: Fall    Mobility  Bed Mobility Overal bed mobility: Needs Assistance Bed Mobility: Rolling;Sidelying to Sit;Sit to Supine Rolling: Max assist Sidelying to sit: Total assist   Sit to supine: Total assist   General bed mobility comments: pt not able to attend to the session, Unable to follow commands in general  Transfers                    Ambulation/Gait                 Stairs             Wheelchair Mobility    Modified Rankin (Stroke Patients Only)       Balance Overall balance assessment:  Needs assistance Sitting-balance support: Feet supported;Bilateral upper extremity supported;Single extremity supported Sitting balance-Leahy Scale: Poor Sitting balance - Comments: reliant on both UE assist and/or external support, today at max assist Postural control: Posterior lean                                  Cognition Arousal/Alertness: Lethargic Behavior During Therapy: Restless Overall Cognitive Status: Impaired/Different from baseline                     Current Attention Level: Focused Memory: Decreased recall of precautions;Decreased short-term memory Following Commands: Follows one step commands inconsistently Safety/Judgement: Decreased awareness of safety;Decreased awareness of deficits Awareness: Intellectual Problem Solving: Slow processing;Requires verbal cues        Exercises General Exercises - Upper Extremity Shoulder ABduction: PROM;Both;5 reps;AAROM Shoulder Horizontal ABduction: PROM;AAROM;Both;5 reps Shoulder Horizontal ADduction: PROM;AAROM;Both;5 reps Elbow Flexion: PROM;Both;AAROM;5 reps Elbow Extension: PROM;AAROM;Both;5 reps Wrist Flexion: PROM;Both;5 reps Wrist Extension: PROM;5 reps;Both General Exercises - Lower Extremity Straight Leg Raises: AAROM;Both;5 reps;PROM Hip Flexion/Marching: PROM;AAROM;Both    General Comments        Pertinent Vitals/Pain Pain Assessment: Faces Pain Location: generalized with movement Pain Descriptors / Indicators: Grimacing;Other (Comment) (louder noises) Pain Intervention(s): Monitored during session    Home Living                      Prior Function  PT Goals (current goals can now be found in the care plan section) Acute Rehab PT Goals Patient Stated Goal: to get stronger PT Goal Formulation: With patient Time For Goal Achievement: 01/03/21 Potential to Achieve Goals: Fair Progress towards PT goals: Not progressing toward goals - comment  (encephalopathic)    Frequency    Min 2X/week      PT Plan Current plan remains appropriate    Co-evaluation              AM-PAC PT "6 Clicks" Mobility   Outcome Measure  Help needed turning from your back to your side while in a flat bed without using bedrails?: A Lot Help needed moving from lying on your back to sitting on the side of a flat bed without using bedrails?: A Lot Help needed moving to and from a bed to a chair (including a wheelchair)?: Total Help needed standing up from a chair using your arms (e.g., wheelchair or bedside chair)?: Total Help needed to walk in hospital room?: Total Help needed climbing 3-5 steps with a railing? : Total 6 Click Score: 8    End of Session   Activity Tolerance: Patient tolerated treatment well;Patient limited by lethargy Patient left: in bed;with call bell/phone within reach;with bed alarm set;with SCD's reapplied Nurse Communication: Mobility status PT Visit Diagnosis: Unsteadiness on feet (R26.81);Other abnormalities of gait and mobility (R26.89);Muscle weakness (generalized) (M62.81)     Time: 2229-7989 PT Time Calculation (min) (ACUTE ONLY): 20 min  Charges:  $Therapeutic Activity: 8-22 mins                     12/27/2020  Jacinto Halim., PT Acute Rehabilitation Services [phone]  (pager) [phone]  (office)   Eliseo Gum Beonca Gibb 12/27/2020, 6:03 PM

## 2020-12-28 ENCOUNTER — Encounter (HOSPITAL_COMMUNITY): Payer: Self-pay | Admitting: Internal Medicine

## 2020-12-28 LAB — BASIC METABOLIC PANEL
Anion gap: 8 (ref 5–15)
BUN: 22 mg/dL (ref 8–23)
CO2: 22 mmol/L (ref 22–32)
Calcium: 8.5 mg/dL — ABNORMAL LOW (ref 8.9–10.3)
Chloride: 110 mmol/L (ref 98–111)
Creatinine, Ser: 0.74 mg/dL (ref 0.61–1.24)
GFR, Estimated: >60 mL/min (ref >60)
Glucose, Bld: 128 mg/dL — ABNORMAL HIGH (ref 70–99)
Potassium: 2.9 mmol/L — ABNORMAL LOW (ref 3.5–5.1)
Sodium: 140 mmol/L (ref 135–145)

## 2020-12-28 LAB — CBC
HCT: 23.4 % — ABNORMAL LOW (ref 39.0–52.0)
Hemoglobin: 7.6 g/dL — ABNORMAL LOW (ref 13.0–17.0)
MCH: 30.4 pg (ref 26.0–34.0)
MCHC: 32.5 g/dL (ref 30.0–36.0)
MCV: 93.6 fL (ref 80.0–100.0)
Platelets: 58 10*3/uL — ABNORMAL LOW (ref 150–400)
RBC: 2.5 MIL/uL — ABNORMAL LOW (ref 4.22–5.81)
RDW: 22.4 % — ABNORMAL HIGH (ref 11.5–15.5)
WBC: 21.3 10*3/uL — ABNORMAL HIGH (ref 4.0–10.5)
nRBC: 0 % (ref 0.0–0.2)

## 2020-12-28 LAB — GLUCOSE, CAPILLARY
Glucose-Capillary: 110 mg/dL — ABNORMAL HIGH (ref 70–99)
Glucose-Capillary: 119 mg/dL — ABNORMAL HIGH (ref 70–99)
Glucose-Capillary: 122 mg/dL — ABNORMAL HIGH (ref 70–99)
Glucose-Capillary: 131 mg/dL — ABNORMAL HIGH (ref 70–99)
Glucose-Capillary: 137 mg/dL — ABNORMAL HIGH (ref 70–99)

## 2020-12-28 LAB — MAGNESIUM: Magnesium: 1.8 mg/dL (ref 1.7–2.4)

## 2020-12-29 LAB — AEROBIC/ANAEROBIC CULTURE W GRAM STAIN (SURGICAL/DEEP WOUND)

## 2020-12-29 LAB — CULTURE, BLOOD (ROUTINE X 2): Culture: NO GROWTH

## 2020-12-31 LAB — CULTURE, BLOOD (ROUTINE X 2)
Culture: NO GROWTH
Culture: NO GROWTH
Special Requests: ADEQUATE
Special Requests: ADEQUATE

## 2021-01-10 NOTE — Progress Notes (Signed)
Referring Physician(s): Critical Care  Supervising Physician: Stanley Scott  Patient Status:  [facility] - In-pt  Chief Complaint: Septic shock secondary to acute cholecystitis and E. Coli bacteremia; S/p drain placed 12/24/20  Subjective: Patient has been transferred out of the ICU. He is in bed with eyes open, occasionally speaking but I am unable to understand him. He appears comfortable; safety mitts in place.   Allergies: Patient has no allergy information on record.  Medications: Prior to Admission medications   Medication Sig Start Date End Date Taking? Authorizing Provider  acetaminophen (TYLENOL) 500 MG tablet Take 500 mg by mouth every 6 (six) hours as needed for moderate pain or headache.   Yes [provider]  atorvastatin (LIPITOR) 40 MG tablet Take 40 mg by mouth daily. 10/08/20  Yes [provider]  cephALEXin (KEFLEX) 500 MG capsule Take 1 capsule (500 mg total) by mouth 4 (four) times daily. 11/25/20  Yes Vanetta Mulders, MD  ergocalciferol (VITAMIN D2) 1.25 MG (50000 UT) capsule Take 50,000 Units by mouth once a week. 10/08/20  Yes [provider]  folic acid (FOLVITE) 1 MG tablet Take 1 mg by mouth daily. 10/08/20  Yes [provider]  metoprolol tartrate (LOPRESSOR) 50 MG tablet Take 50 mg by mouth 2 (two) times daily. 11/30/20  Yes [provider]  pantoprazole (PROTONIX) 40 MG tablet Take 40 mg by mouth daily. 10/08/20  Yes [provider]  sodium chloride 1 g tablet Take 1 g by mouth 2 (two) times daily with a meal. 10/08/20  Yes [provider]  thiamine 100 MG tablet Take 100 mg by mouth daily. Patient not taking: No sig reported 10/08/20   [provider]     Vital Signs: BP 99/60 (BP Location: Left Arm)   Pulse 93   Temp 98.2 F (36.8 C) (Axillary)   Resp (!) 22   Ht 5\' 5"  (1.651 m)   Wt 116 lb (52.6 kg)   SpO2 99%   BMI 19.30 kg/m   Physical Exam Constitutional:      General: He  is not in acute distress. Pulmonary:     Effort: Pulmonary effort is normal.  Abdominal:     Palpations: Abdomen is soft.     Comments: RUQ percutaneous cholecystostomy drain in place. Approximately 10 ml of light bile in gravity bag. Easily flushed, dressing is clean and dry. Skin insertion site is clean and dry, suture in place.    Skin:    General: Skin is warm and dry.  Neurological:     Mental Status: He is alert. He is disoriented.    Imaging: IR Perc Cholecystostomy  Result Date: 12/25/2020 INDICATION: 70 year old with sepsis and acute calculus cholecystitis. Patient needs source control for sepsis management. EXAM: PERCUTANEOUS CHOLECYSTOSTOMY TUBE PLACEMENT WITH ULTRASOUND AND FLUOROSCOPIC GUIDANCE MEDICATIONS: Patient was already receiving inpatient antibiotics and no additional antibiotics were given for the procedure. ANESTHESIA/SEDATION: Moderate (conscious) sedation was employed during this procedure. A total of Versed 0.0 mg and Fentanyl 50 mcg was administered intravenously. Moderate Sedation Time: 11 minutes. The patient's level of consciousness and vital signs were monitored continuously by radiology nursing throughout the procedure under my direct supervision. FLUOROSCOPY TIME:  Fluoroscopy Time: 54 seconds, 3 mGy COMPLICATIONS: None immediate. PROCEDURE: Informed telephone consent was obtained from the patient's son after a thorough discussion of the procedural risks, benefits and alternatives. All questions were addressed. A timeout was performed prior to the initiation of the procedure. Patient was placed supine.  The right abdomen was prepped and draped in sterile fashion. Maximal barrier sterile technique was utilized including caps, mask, sterile gowns, sterile gloves, sterile drape, hand hygiene and skin antiseptic. Skin was anesthetized using 1% lidocaine. Using ultrasound guidance, a 21 gauge needle was directed into the distended gallbladder. A trans peritoneal approach was  selected rather than a transhepatic approach based on the gallbladder position. 0.018 wire was placed. A transitional dilator set was placed. Superstiff Amplatz wire was advanced into the gallbladder and the tract was dilated to accommodate a 10 [place] multipurpose drain. Approximately 80 mL of brown fluid was aspirated. The gallbladder was decompressed. Drain was flushed with saline and attached to a gravity bag. Fluid sample was sent for culture. Catheter was sutured to skin and a dressing was placed. FINDINGS: The gallbladder was markedly distended with thick wall and shadowing gallstone. Drain was successfully placed in the gallbladder. 80 mL of brown fluid was removed. Gallbladder was decompressed based on ultrasound at the end of the procedure. IMPRESSION: Successful percutaneous cholecystostomy tube placement with ultrasound and fluoroscopic guidance. Electronically Signed   By: Richarda Overlie M.D.   On: 12/25/2020 08:06   DG Abd Portable 1V  Result Date: 12/26/2020 CLINICAL DATA:  Nasogastric tube placement EXAM: PORTABLE ABDOMEN - 1 VIEW COMPARISON:  Portable exam 1106 hours compared to CT abdomen and pelvis of 12/24/2020 FINDINGS: Tip of nasogastric tube is in RIGHT lower lobe; recommend withdrawal on replacement. Slight gaseous distention of stomach. Dilated small bowel loops new since 12/24/2020, with some gas in the ascending colon, could reflect ileus. Cholecystostomy tube present. Bibasilar atelectasis versus infiltrate. No acute osseous findings. IMPRESSION: Nasogastric tube is in the RIGHT lower lobe; recommend withdrawal and replacement. This was directly communicated with the patient's nurse Maralyn Sago in 4N MICU prior to dictation of this report, 1124 hours on 12/26/2020. Diffuse small bowel dilatation with some RIGHT colonic gas question ileus. Cholecystostomy tube. Electronically Signed   By: Ulyses Southward M.D.   On: 12/26/2020 11:26    Labs:  CBC: Recent Labs    12/24/20 0645 12/25/20 0119  12/26/20 0021 24-Jan-2021 0317  WBC 50.4* 36.0* 22.2* 21.3*  HGB 7.8* 9.4* 8.5* 7.6*  HCT 25.0* 30.7* 26.3* 23.4*  PLT 117* 117* 74* 58*    COAGS: Recent Labs    11/20/2020 1217 12/06/20 0405 12/24/20 1438  INR 1.2 1.2 1.5*  APTT 46* 48* 44*    BMP: Recent Labs    12/25/20 0119 12/26/20 0021 12/26/20 1243 01/24/2021 0317  NA 138 136 137 140  K 3.7 2.9* 3.7 2.9*  CL 102 102 103 110  CO2 22 23 23 22   GLUCOSE 130* 118* 133* 128*  BUN 24* 14 13 22   CALCIUM 8.4* 8.3* 8.2* 8.5*  CREATININE 0.82 0.57* 0.53* 0.74  GFRNONAA >60 >60 >60 >60    LIVER FUNCTION TESTS: Recent Labs    12/24/20 0337 12/24/20 0645 12/25/20 0119 12/26/20 0021  BILITOT 1.8* 1.8* 2.3* 1.6*  AST 357* 353* 357* 157*  ALT 195* 191* 243* 146*  ALKPHOS 429* 334* 295* 252*  PROT 5.2* 4.8* 5.3* 5.2*  ALBUMIN 1.9* 1.7* 1.8* 1.6*    Assessment and Plan:  Septic shock secondary to acute cholecystitis and E. Coli bacteremia; S/p drain placed 12/24/20: T. Bili and WBC levels decreasing. 25 ml drain output documented in Epic with another 10 ml in gravity bag.   IR recommends to continue flushing the drain and documenting the output each shift. Change the dressing daily or as  needed. Drain will need to remain in place at least 4-6 weeks unless gallbladder removed surgically. Follow up cholangiogram in 4-6 weeks.  Other plans per primary teams; IR will continue to follow.    Electronically Signed: Alwyn Ren, AGACNP-BC [phone] 01/08/2021, 3:15 PM   I spent a total of 15 Minutes at the the patient's bedside AND on the patient's hospital floor or unit, greater than 50% of which was counseling/coordinating care for percutaneous cholecystostomy drain care.

## 2021-01-10 NOTE — Discharge Summary (Signed)
Physician Death Summary  [name] ATF:[id number] DOB: 11/13/1951 DOA: 12-25-2020  PCP: Toma Deiters, MD  Admit date: 12-25-20 Date and time of death: 18:26 pm, 01/17/19  Diagnoses at time of death: Principal diagnosis is #1 1. Aspiration 2. Acute cholecystitis 3. E. Coli Bacteremia 4. Elevated LFT's 5. Acute encephalopathy 6. Aspiration pneumonia 7. Unstageable sacral decubitus ulcer 8. C Diff Colitis 9. Cervical spine stenosis 10. Severe malnutrition 11. Septic shock due to cholecystitis, E. Coli bacteremia, and C Diff Colitis  Discharge Condition: Deceased  Filed Weights   01-13-2021 0439 12/27/20 0500 17-Jan-2021 1833  Weight: 47.2 kg 52.6 kg 52.6 kg    History of present illness: Stanley Scott is a 70 y.o. 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  Patient was unable to provide history, history was obtained from ED PA and ED medical record.  Per report, patient was reported to have had several episodes of loose to watery stools daily for the past week.  He has also had poor appetite, though family encouraged oral hydration.  Patient sustained a fall by landing on his outstretched left upper extremity while being transferred from his bed to the bedside commode (around 2 AM today).  He complained of left thumb, forearm and shoulder pain, but denies head injury. Patient was seen in the ED on 12/17 and was suspected to have acute cystitis without hematuria, he was discharged home with Keflex at that time.  Patient was reported to be sedentary at baseline and lives with son who provide significant assistance with ADLs.  He denies chest pain, shortness of breath, nausea, vomiting, abdominal pain.  ED Course: In the emergency department, temperature  was 93.1F on arrival, BP 89/55, but other vital signs were within normal range.  Work-up in the ED showed normocytic anemia with H/H at 7.2/23.7, K+ 2.7, albumin 2.2, TSH was normal, lactic acid was negative, urinalysis was unimpressive for UTI.  Hemoccult was negative. Left shoulder, left forearm and left hand x-rays were negative for fracture.  Chest x-ray showed no acute cardiopulmonary disease.  Bair hugger was provided with improvement in body temperature.  IV hydration was provided, potassium was replenished and magnesium was given.  Type and screen was done with plan to transfuse 2 units of blood in the ED due to symptomatic anemia.  Hospitalist was asked to admit patient for further evaluation and management.   Hospital Course: 70 year old male that originally presented the emergency room with electrolyte disturbances. Patient was found to have C. difficile colitis and was admitted to the medical floor. During the patient's hospital stay he had progressive quadriparesis. MRI of the cervical spine demonstrated severe stenosis. Neurosurgery had been consulted but he was not considered a neurosurgical candidate secondary to his comorbidities. He also has a known unstageable sacral decubiti. Evening of 1/15the patient became hypotensive and started complaining of generalized pain. Lactic acid was obtained and found to be greater than 8.  Patient is a DNR but wishes concerning vasoactive medications are unclear at this time.  80 yoM originally admitted for Cdiff and dehydration with hospitalization complicated by progressive quadriparesis noted to have severe cervical stenosis with cord compression; not surgical candidate treated with steroids. Developed septic shock overnight 1/14 and moved to ICU for pressor support. Found to have acute cholecystitis, and went for perc  drain in IR 1/15. Pressor support was stopped on 12/26/2020.  The patient was evaluated by Nutritional Management on  12/26/2020. He was felt to have severe malnutrition. Cortrak was placed and the patient was started on tube feeds. Now recommendation has been made for PEG as the patient proceeds to SNF placement. It is not clear where long term tube feeds lie in the goals of care as delineated by family for this patient who is a DNR.   Received call from nursing at 18:26 that the patient had vomited tube feed. He then has a couple of agonal breaths followed by cessation of respirations and spontaneous cardiac activity. Nurse, Inetta Fermoina was to pronounce the patient. I called and spoke to the patient's son [name] to explain that his father had expired. He voiced understanding. Condolences given.  Today's assessment: S: Please see discharge summary dated 28-Dec-2020 for last physical exam prior to death. O: Vitals:  Vitals:   03-23-21 1315 03-23-21 1740  BP: 99/60 105/74  Pulse: 93 (!) 105  Resp: (!) 22 20  Temp: 98.2 F (36.8 C)   SpO2: 99% 91%    The results of significant diagnostics from this hospitalization (including imaging, microbiology, ancillary and laboratory) are listed below for reference.    Significant Diagnostic Studies: CT ABDOMEN PELVIS WO CONTRAST  Result Date: 12/24/2020 CLINICAL DATA:  Sepsis. EXAM: CT ABDOMEN AND PELVIS WITHOUT CONTRAST TECHNIQUE: Multidetector CT imaging of the abdomen and pelvis was performed following the standard protocol without IV contrast. COMPARISON:  None. FINDINGS: Lower chest: Mild left basilar opacity is noted concerning for atelectasis or possibly infiltrate. Hepatobiliary: Gallbladder is distended with cholelithiasis. Significant amount of inflammatory changes noted around the gallbladder concerning for cholecystitis. No biliary dilatation is noted. The liver is unremarkable. Pancreas: Unremarkable. No pancreatic ductal dilatation or surrounding inflammatory changes. Spleen: Normal in size without focal abnormality. Adrenals/Urinary Tract: Adrenal glands and kidneys  appear normal. No hydronephrosis or renal obstruction is noted. No renal or ureteral calculi are noted. Mild urinary bladder distention is noted. Stomach/Bowel: The stomach appears normal. There is no evidence of bowel obstruction or inflammation. The appendix is not clearly visualized. Vascular/Lymphatic: Aortic atherosclerosis. No enlarged abdominal or pelvic lymph nodes. Reproductive: Prostate is unremarkable. Other: No abdominal wall hernia or abnormality. No abdominopelvic ascites. Musculoskeletal: No acute or significant osseous findings. IMPRESSION: 1. Gallbladder distention is noted with cholelithiasis and surrounding inflammatory changes, consistent with acute cholecystitis. Ultrasound may be performed for further evaluation. These results will be called to the ordering clinician or representative by the Radiologist Assistant, and communication documented in the PACS or zVision Dashboard. 2. Mild left basilar opacity is noted concerning for atelectasis or possibly infiltrate. 3. Mild urinary bladder distention is noted. Aortic Atherosclerosis (ICD10-I70.0). Electronically Signed   By: Lupita RaiderJames  Green Jr M.D.   On: 12/24/2020 13:23   DG Forearm Left  Result Date: 12/06/2020 CLINICAL DATA:  Fall EXAM: LEFT FOREARM - 2 VIEW COMPARISON:  None. FINDINGS: There is no evidence of fracture or other focal bone lesions. Soft tissues are unremarkable. Degenerative change in the radiocarpal joint. IMPRESSION: Negative for fracture. Electronically Signed   By: Marlan Palauharles  Clark M.D.   On: 12/04/2020 16:25   MR BRAIN WO CONTRAST  Result Date: 12/07/2020 CLINICAL DATA:  Acute neuro deficit. EXAM: MRI HEAD WITHOUT CONTRAST TECHNIQUE: Multiplanar, multiecho pulse sequences of the brain and surrounding structures were obtained without intravenous contrast. COMPARISON:  CT head 12/04/2020.  CT angio head and neck 09/26/2020 FINDINGS: Brain: Negative for acute  infarct. Mild white matter changes bilaterally. Small chronic  infarct left frontal lobe over the convexity. Negative for hemorrhage or mass. Moderate atrophy without hydrocephalus. Vascular: Normal arterial flow voids Skull and upper cervical spine: No focal skeletal lesion. Cervical spondylosis with bony sclerosis as noted on the prior CTA neck. Sinuses/Orbits: Mild mucosal edema paranasal sinuses. Negative orbit. Other: None IMPRESSION: Moderate atrophy.  Small chronic infarct left frontal lobe. Negative for acute infarct. Electronically Signed   By: Marlan Palau M.D.   On: 12/07/2020 10:58   MR CERVICAL SPINE W WO CONTRAST  Result Date: 12/08/2020 CLINICAL DATA:  Bilateral upper extremity and lower extremity weakness. The patient suffered a fall approximately 4 weeks ago. EXAM: MRI CERVICAL SPINE WITHOUT AND WITH CONTRAST TECHNIQUE: Multiplanar and multiecho pulse sequences of the cervical spine, to include the craniocervical junction and cervicothoracic junction, were obtained without and with intravenous contrast. CONTRAST:  5 mL GADAVIST IV SOLN COMPARISON:  CT angiogram of the neck 09/27/2019. FINDINGS: Alignment: There is mild reversal of the normal cervical lordosis. Vertebrae: No fracture, evidence of discitis, or bone lesion. Degenerative endplate signal change is seen from C3-4 to C6-7, worst at C3-4. Cord: Edema without enhancement is seen in the cervical cord from approximately the inferior endplate of C3 is through the C4-5 level. Posterior Fossa, vertebral arteries, paraspinal tissues: Negative. Disc levels: C2-3: Disc bulge effaces the ventral thecal sac but the central canal and foramina appear open. C3-4: Disc bulge, uncovertebral disease and left much worse than right facet arthropathy. There is severe central canal and bilateral foraminal stenosis. The cord is markedly deformed. C4-5: Disc bulge, uncovertebral disease and facet arthropathy on the left. There is severe central canal and bilateral foraminal narrowing. The cord is severely flattened.  C5-6: Disc bulge and right worse than left uncovertebral disease. There is also moderate bilateral facet arthropathy. The ventral thecal sac is nearly effaced. Moderately severe to severe foraminal narrowing is worse on the right. C6-7: Disc bulge and uncovertebral spurring. Bilateral facet arthropathy. The central canal is open. Severe bilateral foraminal narrowing. C7-T1: Negative. IMPRESSION: Severe central canal and bilateral foraminal stenosis at C3-4 and C4-5. 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 Moderately severe to severe bilateral foraminal narrowing at C5-6 is worse on the right. The ventral thecal sac is nearly effaced by disc at this level. Severe bilateral foraminal narrowing at C6-7. The central canal is open at this level. These results were called by telephone at the time of interpretation on 12/08/2020 at 12:31 pm to provider [facility] , who verbally acknowledged these results. Electronically Signed   By: Drusilla Kanner M.D.   On: 12/08/2020 12:35   IR Perc Cholecystostomy  Result Date: 12/25/2020 INDICATION: 70 year old with sepsis and acute calculus cholecystitis. Patient needs source control for sepsis management. EXAM: PERCUTANEOUS CHOLECYSTOSTOMY TUBE PLACEMENT WITH ULTRASOUND AND FLUOROSCOPIC GUIDANCE MEDICATIONS: Patient was already receiving inpatient antibiotics and no additional antibiotics were given for the procedure. ANESTHESIA/SEDATION: Moderate (conscious) sedation was employed during this procedure. A total of Versed 0.0 mg and Fentanyl 50 mcg was administered intravenously. Moderate Sedation Time: 11 minutes. The patient's level of consciousness and vital signs were monitored continuously by radiology nursing throughout the procedure under my direct supervision. FLUOROSCOPY TIME:  Fluoroscopy Time: 54 seconds, 3 mGy COMPLICATIONS: None  immediate. PROCEDURE: Informed telephone consent was obtained from the patient's son after a thorough discussion of  the procedural risks, benefits and alternatives. All questions were addressed. A timeout was performed prior to the initiation of the procedure. Patient was placed supine. The right abdomen was prepped and draped in sterile fashion. Maximal barrier sterile technique was utilized including caps, mask, sterile gowns, sterile gloves, sterile drape, hand hygiene and skin antiseptic. Skin was anesthetized using 1% lidocaine. Using ultrasound guidance, a 21 gauge needle was directed into the distended gallbladder. A trans peritoneal approach was selected rather than a transhepatic approach based on the gallbladder position. 0.018 wire was placed. A transitional dilator set was placed. Superstiff Amplatz wire was advanced into the gallbladder and the tract was dilated to accommodate a 10 [place] multipurpose drain. Approximately 80 mL of brown fluid was aspirated. The gallbladder was decompressed. Drain was flushed with saline and attached to a gravity bag. Fluid sample was sent for culture. Catheter was sutured to skin and a dressing was placed. FINDINGS: The gallbladder was markedly distended with thick wall and shadowing gallstone. Drain was successfully placed in the gallbladder. 80 mL of brown fluid was removed. Gallbladder was decompressed based on ultrasound at the end of the procedure. IMPRESSION: Successful percutaneous cholecystostomy tube placement with ultrasound and fluoroscopic guidance. Electronically Signed   By: Richarda Overlie M.D.   On: 12/25/2020 08:06   DG Chest Port 1 View  Result Date: 12/24/2020 CLINICAL DATA:  Leukocytosis. EXAM: PORTABLE CHEST 1 VIEW COMPARISON:  12/15/2020. FINDINGS: There is airspace opacity in the left lung base, retrocardiac region, new since the prior study. There is from a linear type opacity in the left mid lung similar to the prior exam consistent with  scarring. Milder opacity is noted at the medial right lung base, not convincingly changed from the prior exam, allowing for the patient's current positioning, rotated to the left. Remainder of the lungs is clear. No convincing pleural effusion and no pneumothorax. Cardiac silhouette normal in size.  No mediastinal or hilar masses. Skeletal structures are demineralized but grossly intact. IMPRESSION: 1. Airspace opacity at the left lung base concerning for pneumonia. 2. No other evidence of acute cardiopulmonary disease. Electronically Signed   By: Amie Portland M.D.   On: 12/24/2020 10:42   DG CHEST PORT 1 VIEW  Result Date: 12/15/2020 CLINICAL DATA:  Airway aspiration EXAM: PORTABLE CHEST 1 VIEW COMPARISON:  12-27-20 FINDINGS: Negative for pneumonia. Negative for heart failure. Streaky lung markings bilaterally unchanged most consistent with scarring. IMPRESSION: No active disease. Electronically Signed   By: Marlan Palau M.D.   On: 12/15/2020 13:38   DG Chest Port 1 View  Result Date: Dec 27, 2020 CLINICAL DATA:  Questionable sepsis. EXAM: PORTABLE CHEST 1 VIEW COMPARISON:  December 17, 21. FINDINGS: The heart size and mediastinal contours are within normal limits. Aortic atherosclerosis. No consolidation. Similar mild right basilar atelectasis/scar. No visible pleural effusions or pneumothorax. Right-sided skin fold. No acute osseous abnormality. EKG leads project over the chest. IMPRESSION: No acute cardiopulmonary disease. Electronically Signed   By: Feliberto Harts MD   On: 2020-12-27 13:23   DG Shoulder Left  Result Date: December 27, 2020 CLINICAL DATA:  Fall EXAM: LEFT SHOULDER - 2+ VIEW COMPARISON:  None. FINDINGS: There is no evidence of fracture or dislocation. There is no evidence of arthropathy or other focal bone abnormality. Soft tissues are unremarkable. IMPRESSION: Negative. Electronically Signed   By: Marlan Palau M.D.   On: 12/27/2020 16:26   DG Abd Portable 1V  Result Date:  12/26/2020 CLINICAL DATA:  Nasogastric tube placement EXAM: PORTABLE ABDOMEN -  1 VIEW COMPARISON:  Portable exam 1106 hours compared to CT abdomen and pelvis of 12/24/2020 FINDINGS: Tip of nasogastric tube is in RIGHT lower lobe; recommend withdrawal on replacement. Slight gaseous distention of stomach. Dilated small bowel loops new since 12/24/2020, with some gas in the ascending colon, could reflect ileus. Cholecystostomy tube present. Bibasilar atelectasis versus infiltrate. No acute osseous findings. IMPRESSION: Nasogastric tube is in the RIGHT lower lobe; recommend withdrawal and replacement. This was directly communicated with the patient's nurse Maralyn Sago in 4N MICU prior to dictation of this report, 1124 hours on 12/26/2020. Diffuse small bowel dilatation with some RIGHT colonic gas question ileus. Cholecystostomy tube. Electronically Signed   By: Ulyses Southward M.D.   On: 12/26/2020 11:26   DG Hand Complete Left  Result Date: 11/13/2020 CLINICAL DATA:  Fall EXAM: LEFT HAND - COMPLETE 3+ VIEW COMPARISON:  None. FINDINGS: Negative for acute fracture Moderate degenerative change in the radiocarpal joint with joint space narrowing and spurring. No erosion identified. IMPRESSION: Negative for fracture. Electronically Signed   By: Marlan Palau M.D.   On: 11/11/2020 16:24   DG Swallowing Func-Speech Pathology  Result Date: 12/08/2020 Objective Swallowing Evaluation: Type of Study: MBS-Modified Barium Swallow Study  Patient Details Name: Stanley Scott MRN: [id number] Date of Birth: 08-08-1951 Today's Date: 12/08/2020 Time: SLP Start Time (ACUTE ONLY): 1210 -SLP Stop Time (ACUTE ONLY): 1235 SLP Time Calculation (min) (ACUTE ONLY): 25 min Past Medical History: No past medical history on file. Past Surgical History: No past surgical history on file. HPI: Uzoma Vivona is a 70 y.o. 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  Patient was unable to provide history, history was obtained from ED PA and ED medical record.  Per report, patient was reported to have had several episodes of loose to watery stools daily for the past week.  He has also had poor appetite, though family encouraged oral hydration.  Patient sustained a fall by landing on his outstretched left upper extremity while being transferred from his bed to the bedside commode (around 2 AM today).  He complained of left thumb, forearm and shoulder pain, but denies head injury.  Patient was seen in the ED on 12/17 and was suspected to have acute cystitis without hematuria, he was discharged home with Keflex at that time.   Patient was reported to be sedentary at baseline and lives with son who provide significant assistance with ADLs.  He denies chest pain, shortness of breath, nausea, vomiting, abdominal pain.  Subjective: 'Ok" Assessment / Plan / Recommendation CHL IP CLINICAL IMPRESSIONS 12/08/2020 Clinical Impression MBSS completed. Pt presents with moderate oropharyngeal dysphagia characterized by reduced labial closure, impaired mastication, with mildly prolonged oral phase, min delay in swallow initiation with swallow trigger at the level of the valleculae and pyriforms with liquids, reduced tongue base retraction, and reduced epiglottic deflection resulting in moderate vallecular residue, penetration of liquids during and after the swallow without witnessed aspiration, and eventual pooling of vallecular residuals into pyriforms. Pt's epiglottis appeared thick and bulbous at the base which became coated with initial tsp presentation of thins. It is possible that Pt had puree in valleculae from previously presented medications in puree, however it may be prudent to obtain CT neck (unless tissure abnormalities can be seen from MRI he just had).  Pt with  significant vallecular residuals with all textures and consistencies. Chin tuck and head turns were trialed and only the chin tuck was effective and only with puree and solids (no benefit with liquids). Pt cued to implement chin to chest when swallowing puree and mech soft and this facilitated clearance during the swallow in the pharynx. Pt unable to hold the cup himself due to BUE weakness, so he was assessed mostly with straws for liquids. There was no appreciable difference in NTL and thins, therefore recommend thin liquids via straw. Recommend D2 and thin liquids via straw sips when Pt is alert and upright, chin tuck with solids, cue to repeat/dry swallow, and clear throat with repeat swallow, po medications can be presented crushed or whole in puree. Pt is at risk for aspiration of residuals after the swallow (valleculae), however this is a risk no matter the textures or consistencies. No aspiration observed today. Consider neck CT to evaluate epiglottis thickening. SLP will follow. SLP Visit Diagnosis Dysphagia, oropharyngeal phase (R13.12) Attention and concentration deficit following -- Frontal lobe and executive function deficit following -- Impact on safety and function Mild aspiration risk   CHL IP TREATMENT RECOMMENDATION 12/08/2020 Treatment Recommendations Therapy as outlined in treatment plan below   Prognosis 12/08/2020 Prognosis for Safe Diet Advancement Fair Barriers to Reach Goals Severity of deficits Barriers/Prognosis Comment -- CHL IP DIET RECOMMENDATION 12/08/2020 SLP Diet Recommendations Dysphagia 2 (Fine chop) solids;Thin liquid Liquid Administration via Straw Medication Administration Whole meds with puree Compensations Slow rate;Small sips/bites;Multiple dry swallows after each bite/sip;Clear throat intermittently;Effortful swallow;Chin tuck Postural Changes Remain semi-upright after after feeds/meals (Comment);Seated upright at 90 degrees   CHL IP OTHER RECOMMENDATIONS 12/08/2020  Recommended Consults (No Data) Oral Care Recommendations Oral care BID;Staff/trained caregiver to provide oral care Other Recommendations Clarify dietary restrictions   CH[facility] IP FOLLOW UP RECOMMENDATIONS 12/08/2020 Follow up Recommendations Skilled Nursing facility   [facility] IP FREQUENCY AND DURATION 12/08/2020 Speech Therapy Frequency (ACUTE ONLY) min 2x/week Treatment Duration 1 week      CHL IP ORAL PHASE 12/08/2020 Oral Phase Impaired Oral - Pudding Teaspoon -- Oral - Pudding Cup -- Oral - Honey Teaspoon -- Oral - Honey Cup -- Oral - Nectar Teaspoon -- Oral - Nectar Cup -- Oral - Nectar Straw Weak lingual manipulation Oral - Thin Teaspoon Left anterior bolus loss;Right anterior bolus loss Oral - Thin Cup Left anterior bolus loss;Right anterior bolus loss Oral - Thin Straw WFL Oral - Puree (No Data) Oral - Mech Soft Impaired mastication Oral - Regular -- Oral - Multi-Consistency -- Oral - Pill Delayed oral transit;Reduced posterior propulsion Oral Phase - Comment --  CHL IP PHARYNGEAL PHASE 12/08/2020 Pharyngeal Phase Impaired Pharyngeal- Pudding Teaspoon -- Pharyngeal -- Pharyngeal- Pudding Cup -- Pharyngeal -- Pharyngeal- Honey Teaspoon -- Pharyngeal -- Pharyngeal- Honey Cup -- Pharyngeal -- Pharyngeal- Nectar Teaspoon -- Pharyngeal -- Pharyngeal- Nectar Cup -- Pharyngeal -- Pharyngeal- Nectar Straw Delayed swallow initiation-vallecula;Delayed swallow initiation-pyriform sinuses;Reduced epiglottic inversion;Reduced tongue base retraction;Penetration/Aspiration during swallow;Penetration/Apiration after swallow;Pharyngeal residue - valleculae;Pharyngeal residue - pyriform;Lateral channel residue Pharyngeal Material does not enter airway;Material enters airway, remains ABOVE vocal cords then ejected out;Material enters airway, remains ABOVE vocal cords and not ejected out Pharyngeal- Thin Teaspoon Delayed swallow initiation-vallecula;Reduced epiglottic inversion;Reduced tongue base retraction;Penetration/Aspiration  during swallow;Penetration/Apiration after swallow;Pharyngeal residue - valleculae Pharyngeal Material does not enter airway;Material enters airway, remains ABOVE vocal cords then ejected out Pharyngeal- Thin Cup Delayed swallow initiation-pyriform sinuses;Reduced epiglottic inversion;Reduced tongue base retraction;Penetration/Aspiration during swallow;Penetration/Apiration after swallow;Pharyngeal residue - valleculae;Pharyngeal residue -  pyriform;Lateral channel residue Pharyngeal Material enters airway, remains ABOVE vocal cords then ejected out;Material enters airway, remains ABOVE vocal cords and not ejected out;Material does not enter airway Pharyngeal- Thin Straw Delayed swallow initiation-pyriform sinuses;Reduced epiglottic inversion;Penetration/Aspiration during swallow;Penetration/Apiration after swallow;Pharyngeal residue - valleculae;Pharyngeal residue - pyriform;Lateral channel residue Pharyngeal Material does not enter airway;Material enters airway, remains ABOVE vocal cords then ejected out;Material enters airway, remains ABOVE vocal cords and not ejected out Pharyngeal- Puree Delayed swallow initiation-vallecula;Reduced epiglottic inversion;Reduced tongue base retraction;Pharyngeal residue - valleculae Pharyngeal -- Pharyngeal- Mechanical Soft -- Pharyngeal -- Pharyngeal- Regular -- Pharyngeal -- Pharyngeal- Multi-consistency -- Pharyngeal -- Pharyngeal- Pill WFL Pharyngeal -- Pharyngeal Comment --  CHL IP CERVICAL ESOPHAGEAL PHASE 12/08/2020 Cervical Esophageal Phase WFL Pudding Teaspoon -- Pudding Cup -- Honey Teaspoon -- Honey Cup -- Nectar Teaspoon -- Nectar Cup -- Nectar Straw -- Thin Teaspoon -- Thin Cup -- Thin Straw -- Puree -- Mechanical Soft -- Regular -- Multi-consistency -- Pill -- Cervical Esophageal Comment -- Thank you, Havery Moros, CCC-SLP [phone] PORTER,DABNEY 12/08/2020, 2:08 PM              [place] EKG SITE RITE  Result Date: 12/24/2020 If Site Rite image not attached,  placement could not be confirmed due to current cardiac rhythm.   Microbiology: Recent Results (from the past 240 hour(s))  Culture, blood (routine x 2)     Status: Abnormal   Collection Time: 12/24/20  6:35 AM   Specimen: BLOOD  Result Value Ref Range Status   Specimen Description BLOOD RIGHT ANTECUBITAL  Final   Special Requests   Final    BOTTLES DRAWN AEROBIC ONLY Blood Culture results may not be optimal due to an inadequate volume of blood received in culture bottles   Culture  Setup Time   Final    GRAM NEGATIVE RODS AEROBIC BOTTLE ONLY CRITICAL RESULT CALLED TO, READ BACK BY AND VERIFIED WITH: Lieutenant Diego [id number] 12/25/2020 Girtha Hake Performed at [facility], [address] [place], [place] [address]    Culture ESCHERICHIA COLI (A)  Final   Report Status 12/26/2020 FINAL  Final   Organism ID, Bacteria ESCHERICHIA COLI  Final      Susceptibility   Escherichia coli - MIC*    AMPICILLIN <=2 SENSITIVE Sensitive     CEFAZOLIN <=4 SENSITIVE Sensitive     CEFEPIME <=0.12 SENSITIVE Sensitive     CEFTAZIDIME <=1 SENSITIVE Sensitive     CEFTRIAXONE <=0.25 SENSITIVE Sensitive     CIPROFLOXACIN <=0.25 SENSITIVE Sensitive     GENTAMICIN <=1 SENSITIVE Sensitive     IMIPENEM <=0.25 SENSITIVE Sensitive     TRIMETH/SULFA <=20 SENSITIVE Sensitive     AMPICILLIN/SULBACTAM <=2 SENSITIVE Sensitive     PIP/TAZO <=4 SENSITIVE Sensitive     * ESCHERICHIA COLI  Blood Culture ID Panel (Reflexed)     Status: Abnormal   Collection Time: 12/24/20  6:35 AM  Result Value Ref Range Status   Enterococcus faecalis NOT DETECTED NOT DETECTED Final   Enterococcus Faecium NOT DETECTED NOT DETECTED Final   Listeria monocytogenes NOT DETECTED NOT DETECTED Final   Staphylococcus species NOT DETECTED NOT DETECTED Final   Staphylococcus aureus (BCID) NOT DETECTED NOT DETECTED Final   Staphylococcus epidermidis NOT DETECTED NOT DETECTED Final   Staphylococcus lugdunensis NOT DETECTED NOT DETECTED  Final   Streptococcus species NOT DETECTED NOT DETECTED Final   Streptococcus agalactiae NOT DETECTED NOT DETECTED Final   Streptococcus pneumoniae NOT DETECTED NOT DETECTED Final   Streptococcus pyogenes NOT  DETECTED NOT DETECTED Final   A.calcoaceticus-baumannii NOT DETECTED NOT DETECTED Final   Bacteroides fragilis NOT DETECTED NOT DETECTED Final   Enterobacterales DETECTED (A) NOT DETECTED Final    Comment: Enterobacterales represent a large order of gram negative bacteria, not a single organism. CRITICAL RESULT CALLED TO, READ BACK BY AND VERIFIED WITH: G. ABBOTT,PHARMD 0042 12/25/2020 T. TYSOR    Enterobacter cloacae complex NOT DETECTED NOT DETECTED Final   Escherichia coli DETECTED (A) NOT DETECTED Final    Comment: CRITICAL RESULT CALLED TO, READ BACK BY AND VERIFIED WITH: G. ABBOTT,PHARMD 0042 12/25/2020 T. TYSOR    Klebsiella aerogenes NOT DETECTED NOT DETECTED Final   Klebsiella oxytoca NOT DETECTED NOT DETECTED Final   Klebsiella pneumoniae NOT DETECTED NOT DETECTED Final   Proteus species NOT DETECTED NOT DETECTED Final   Salmonella species NOT DETECTED NOT DETECTED Final   Serratia marcescens NOT DETECTED NOT DETECTED Final   Haemophilus influenzae NOT DETECTED NOT DETECTED Final   Neisseria meningitidis NOT DETECTED NOT DETECTED Final   Pseudomonas aeruginosa NOT DETECTED NOT DETECTED Final   Stenotrophomonas maltophilia NOT DETECTED NOT DETECTED Final   Candida albicans NOT DETECTED NOT DETECTED Final   Candida auris NOT DETECTED NOT DETECTED Final   Candida glabrata NOT DETECTED NOT DETECTED Final   Candida krusei NOT DETECTED NOT DETECTED Final   Candida parapsilosis NOT DETECTED NOT DETECTED Final   Candida tropicalis NOT DETECTED NOT DETECTED Final   Cryptococcus neoformans/gattii NOT DETECTED NOT DETECTED Final   CTX-M ESBL NOT DETECTED NOT DETECTED Final   Carbapenem resistance IMP NOT DETECTED NOT DETECTED Final   Carbapenem resistance KPC NOT DETECTED NOT  DETECTED Final   Carbapenem resistance NDM NOT DETECTED NOT DETECTED Final   Carbapenem resist OXA 48 LIKE NOT DETECTED NOT DETECTED Final   Carbapenem resistance VIM NOT DETECTED NOT DETECTED Final    Comment: Performed at [facility], [address] [place], [place] [address]  Culture, blood (routine x 2)     Status: None   Collection Time: 12/24/20  6:45 AM   Specimen: BLOOD  Result Value Ref Range Status   Specimen Description BLOOD LEFT ANTECUBITAL  Final   Special Requests   Final    BOTTLES DRAWN AEROBIC ONLY Blood Culture results may not be optimal due to an inadequate volume of blood received in culture bottles   Culture   Final    NO GROWTH 5 DAYS Performed at [facility], [address] [place], [place] [address]    Report Status 12/29/2020 FINAL  Final  Aerobic/Anaerobic Culture (surgical/deep wound)     Status: None (Preliminary result)   Collection Time: 12/24/20  6:05 PM   Specimen: BILE  Result Value Ref Range Status   Specimen Description BILE ASPIRATE  Final   Special Requests NONE  Final   Gram Stain   Final    FEW WBC PRESENT,BOTH PMN AND MONONUCLEAR ABUNDANT GRAM NEGATIVE RODS Performed at [facility], [address] [place], [place] [address]    Culture   Final    ABUNDANT ESCHERICHIA COLI NO ANAEROBES ISOLATED; CULTURE IN PROGRESS FOR 5 DAYS    Report Status PENDING  Incomplete   Organism ID, Bacteria ESCHERICHIA COLI  Final      Susceptibility   Escherichia coli - MIC*    AMPICILLIN <=2 SENSITIVE Sensitive     CEFAZOLIN <=4 SENSITIVE Sensitive     CEFEPIME <=0.12 SENSITIVE Sensitive     CEFTAZIDIME <=1  SENSITIVE Sensitive     CEFTRIAXONE <=0.25 SENSITIVE Sensitive     CIPROFLOXACIN <=0.25 SENSITIVE Sensitive     GENTAMICIN <=1 SENSITIVE Sensitive     IMIPENEM <=0.25 SENSITIVE Sensitive     TRIMETH/SULFA <=20 SENSITIVE Sensitive     AMPICILLIN/SULBACTAM <=2 SENSITIVE Sensitive     PIP/TAZO <=4 SENSITIVE Sensitive     *  ABUNDANT ESCHERICHIA COLI  Culture, blood (routine x 2)     Status: None (Preliminary result)   Collection Time: 12/26/20 12:21 AM   Specimen: BLOOD  Result Value Ref Range Status   Specimen Description BLOOD LEFT ANTECUBITAL  Final   Special Requests   Final    BOTTLES DRAWN AEROBIC ONLY Blood Culture adequate volume   Culture   Final    NO GROWTH 3 DAYS Performed at [facility], [address], [place], [place] [address]    Report Status PENDING  Incomplete  Culture, blood (routine x 2)     Status: None (Preliminary result)   Collection Time: 12/26/20 12:22 AM   Specimen: BLOOD LEFT HAND  Result Value Ref Range Status   Specimen Description BLOOD LEFT HAND  Final   Special Requests   Final    BOTTLES DRAWN AEROBIC ONLY Blood Culture adequate volume   Culture   Final    NO GROWTH 3 DAYS Performed at [facility], [address], [place], [place] [address]    Report Status PENDING  Incomplete     Labs: Basic Metabolic Panel: Recent Labs  Lab 12/24/20 0337 12/24/20 0645 12/25/20 0119 12/26/20 0021 12/26/20 1000 12/26/20 1243 12/26/20 1716 12/27/20 0500 12/27/20 1635 01/21/21 0317  NA 133* 134* 138 136  --  137  --   --   --  140  K 4.8 4.3 3.7 2.9*  --  3.7  --   --   --  2.9*  CL 99 98 102 102  --  103  --   --   --  110  CO2 16* 16* 22 23  --  23  --   --   --  22  GLUCOSE 78 59* 130* 118*  --  133*  --   --   --  128*  BUN 39* 39* 24* 14  --  13  --   --   --  22  CREATININE 1.41* 1.39* 0.82 0.57*  --  0.53*  --   --   --  0.74  CALCIUM 8.9 8.3* 8.4* 8.3*  --  8.2*  --   --   --  8.5*  MG 1.6*  --  1.8 1.7  --   --  2.0 1.9 1.8 1.8  PHOS 5.0*  --   --   --  2.7  --  2.5 2.7 2.8  --    Liver Function Tests: Recent Labs  Lab 12/24/20 0337 12/24/20 0645 12/25/20 0119 12/26/20 0021  AST 357* 353* 357* 157*  ALT 195* 191* 243* 146*  ALKPHOS 429* 334* 295* 252*  BILITOT 1.8* 1.8* 2.3* 1.6*  PROT 5.2* 4.8* 5.3* 5.2*  ALBUMIN 1.9* 1.7* 1.8* 1.6*    No results for input(s): LIPASE, AMYLASE in the last 168 hours. No results for input(s): AMMONIA in the last 168 hours. CBC: Recent Labs  Lab 12/24/20 0645 12/25/20 0119 12/26/20 0021 01-21-21 0317  WBC 50.4* 36.0* 22.2* 21.3*  NEUTROABS 47.1*  --   --   --   HGB 7.8* 9.4* 8.5* 7.6*  HCT 25.0* 30.7* 26.3* 23.4*  MCV 95.8 93.6 89.8 93.6  PLT 117* 117* 74* 58*   Cardiac Enzymes: No results for input(s): CKTOTAL, CKMB, CKMBINDEX, TROPONINI in the last 168 hours. BNP: BNP (last 3 results) No results for input(s): BNP in the last 8760 hours.  ProBNP (last 3 results) No results for input(s): PROBNP in the last 8760 hours.  CBG: Recent Labs  Lab 01/02/2021 0021 01/08/2021 0347 01/05/2021 0804 12/13/2020 1205 12/24/2020 1618  GLUCAP 131* 119* 122* 110* 137*    Principal Problem:   C. difficile diarrhea Active Problems:   Generalized weakness   Hypothermia   Failure to thrive in adult   Hypotension   Symptomatic anemia   Hypoalbuminemia   Prolonged QT interval   Gastric AVM   SIRS (systemic inflammatory response syndrome) (HCC)   Hypokalemia   Essential hypertension   Pressure injury of skin   Protein-calorie malnutrition, severe   Cervical spinal stenosis   Functional quadriplegia (HCC)   Hyperglycemia   Sinus bradycardia   H/O: CVA (cerebrovascular accident)   Underweight   Time coordinating discharge: 38 minutes.  Signed:        Arnita Koons, DO [facility]  12/29/2020, 8:43 AM

## 2021-01-10 NOTE — TOC Progression Note (Addendum)
Transition of Care [facility]) - Progression Note    Patient Details  Name: Mir Fullilove MRN: [id number] Date of Birth: 01-22-1951  [facility] CM/SW Contact  Jimmy Picket, [place] Phone Number: 12/29/2020, 3:35 PM  Clinical Narrative:     CSW reached out to admission coordinator at [facility] [place] who stated they may be able to accept pt if his medicaid application for [place] is pending.   CSW reached out to pts son, Harvie Heck. Harvie Heck stated that he completed the paper work last Friday. He sates he is now waiting on DSS to mail him more paperwork, and he will send it back to them. Harvie Heck states he has been in contact with his [facility] social worker in hopes that the process can be faster since he had [employer]. [facility] financial counseling is unable to assist in this process.  TOC will continue to follow.  Expected Discharge Plan: Skilled Nursing Facility Barriers to Discharge: Continued Medical Work up  Expected Discharge Plan and Services Expected Discharge Plan: Skilled Nursing Facility In-house Referral: Clinical Social Work Discharge Planning Services: NA Post Acute Care Choice: Skilled Nursing Facility Living arrangements for the past 2 months: Single Family Home                 DME Arranged: N/A DME Agency: NA       HH Arranged: NA HH Agency: NA         Social Determinants of Health (SDOH) Interventions    Readmission Risk Interventions No flowsheet data found.  Jimmy Picket, Theresia Majors, [place] Clinical Social Worker [phone]

## 2021-01-10 NOTE — Progress Notes (Signed)
   01/09/2021 1116  Assess: if the MEWS score is Yellow or Red  Were vital signs taken at a resting state? Yes  Focused Assessment No change from prior assessment  Early Detection of Sepsis Score *See Row Information* Low  MEWS guidelines implemented *See Row Information* Yes  Treat  MEWS Interventions Escalated (See documentation below)  Take Vital Signs  Increase Vital Sign Frequency  Yellow: Q 2hr X 2 then Q 4hr X 2, if remains yellow, continue Q 4hrs  Escalate  MEWS: Escalate Yellow: discuss with charge nurse/RN and consider discussing with provider and RRT  Notify: Charge Nurse/RN  Name of Charge Nurse/RN Notified Park Meo, RN  Date Charge Nurse/RN Notified 12/13/2020  Time Charge Nurse/RN Notified 1119  Notify: Provider  Provider Name/Title Fran Lowes, RN  Date Provider Notified 01/04/2021  Time Provider Notified 1122  Notification Type  (secure chat)  Notification Reason  (Yellow MEWS)  Response No new orders  Date of Provider Response 12/16/2020  Time of Provider Response 1123  Document  Patient Outcome Stabilized after interventions  Progress note created (see row info) Yes    Patient previously a RED MEWS. Will initiate YELLOW MEWS protocol and continue to monitor on going. MD notified. No new orders initiated at this time.

## 2021-01-10 NOTE — Progress Notes (Addendum)
Patient vomited  Small amount of yellow emesis after increasing TF rate to 35 ,Zofran 4 mg IV given. Will monitor.

## 2021-01-10 NOTE — Progress Notes (Signed)
[facility] PCCM  Chart reviewed PCCM will sign off  Heber , MD [place] PCCM Pager: [phone] Cell: [phone] If no response, call [phone]

## 2021-01-10 NOTE — Progress Notes (Signed)
PROGRESS NOTE  Stanley Scott CVE:[id number] DOB: 02-13-1951 DOA: 12/18/2020 PCP: Toma Deiters, MD  Brief History   70 year old male that originally presented the emergency room with electrolyte disturbances.  Patient was found to have C. difficile colitis and was admitted to the medical floor.  During the patient's hospital stay he had progressive quadriparesis.  MRI of the cervical spine demonstrated severe stenosis.  Neurosurgery had been consulted but he was not considered a neurosurgical candidate secondary to his comorbidities.  He also has a known unstageable sacral decubiti.  Evening of 1/15 the patient became hypotensive and started complaining of generalized pain.  Lactic acid was obtained and found to be greater than 8.  Patient is a DNR but wishes concerning vasoactive medications are unclear at this time.  62 yoM originally admitted for Cdiff and dehydration with hospitalization complicated by progressive quadriparesis noted to have severe cervical stenosis with cord compression; not surgical candidate treated with steroids.  Developed septic shock overnight 1/14 and moved to ICU for pressor support.  Found to have acute cholecystitis, and went for perc drain in IR 1/15. Pressor support was stopped on 12/26/2020.  The patient was evaluated by Nutritional Management on 12/26/2020. He was felt to have severe malnutrition. Cortrak was placed and the patient was started on tube feeds. Now recommendation has been made for PEG as the patient proceeds to SNF placement. It is not clear where long term tube feeds lie in the goals of care as delineated by family for this patient who is a DNR.   Palliative care to investigate.  Consultants  . PCCM . Wound Care . Interventional Radiology . Palliative Care  Procedures  . Placement of biliary drain by interventional radiology  Antibiotics   Anti-infectives (From admission, onward)   Start     Dose/Rate Route Frequency Ordered Stop    12/27/20 0600  ceFAZolin (ANCEF) IVPB 2g/100 mL premix        2 g 200 mL/hr over 30 Minutes Intravenous Every 8 hours 12/26/20 1426     12/26/20 0800  vancomycin (VANCOCIN) IVPB 1000 mg/200 mL premix  Status:  Discontinued        1,000 mg 200 mL/hr over 60 Minutes Intravenous Every 48 hours 12/24/20 0905 12/25/20 1000   12/25/20 1100  cefTRIAXone (ROCEPHIN) 2 g in sodium chloride 0.9 % 100 mL IVPB  Status:  Discontinued        2 g 200 mL/hr over 30 Minutes Intravenous Every 24 hours 12/25/20 1000 12/26/20 1426   12/24/20 0600  vancomycin (VANCOCIN) IVPB 1000 mg/200 mL premix        1,000 mg 200 mL/hr over 60 Minutes Intravenous  Once 12/24/20 0456 12/24/20 0844   12/24/20 0600  metroNIDAZOLE (FLAGYL) IVPB 500 mg  Status:  Discontinued        500 mg 100 mL/hr over 60 Minutes Intravenous Every 8 hours 12/24/20 0456 12/25/20 1000   12/24/20 0530  ceFEPIme (MAXIPIME) 2 g in sodium chloride 0.9 % 100 mL IVPB  Status:  Discontinued        2 g 200 mL/hr over 30 Minutes Intravenous 2 times daily 12/24/20 0456 12/25/20 1000   12/06/20 1830  vancomycin (VANCOCIN) 50 mg/mL oral solution 125 mg        125 mg Oral 4 times daily 12/06/20 1738 12/16/20 1759   12-18-2020 1300  cefTRIAXone (ROCEPHIN) 1 g in sodium chloride 0.9 % 100 mL IVPB  Status:  Discontinued  1 g 200 mL/hr over 30 Minutes Intravenous Every 24 hours 11/12/2020 1259 11/10/2020 2218    .   Subjective  The patient is resting in bed. Wounds have just been addressed. He is complaining of pain.   Objective   Vitals:  Vitals:   January 19, 2021 1315 01-19-2021 1740  BP: 99/60 105/74  Pulse: 93 (!) 105  Resp: (!) 22 20  Temp: 98.2 F (36.8 C)   SpO2: 99% 91%    Exam:  Constitutional:  . The patient is awake and alert. He is cachectic, weak and very frail appearing. Respiratory:  . No increased work of breathing. . No wheezes, rales, or rhonchi . No tactile fremitus Cardiovascular:  . Regular rate and rhythm . No murmurs, ectopy,  or gallups. . No lateral PMI. No thrills. Abdomen:  . Abdomen is soft, non-tender, non-distended . No hernias, masses, or organomegaly . Normoactive bowel sounds.  Stanley Scott Musculoskeletal:  . No cyanosis, clubbing, or edema . Cachectic Skin:  . No rashes, lesions, ulcers . palpation of skin: no induration or nodules Neurologic:  . CN 2-12 intact . Sensation all 4 extremities intact Psychiatric:  Unable to evaluate as the patient is unable to cooperate with exam.  I have personally reviewed the following:   Today's Data  . Vitals, BMP, CBC  Micro Data  . Blood culture x 2 12/24/2020 . Blood culture x 2 12/26/2020  Imaging  . X-ray of abdomen . CT abdomen and pelvis  Cardiology Data  . EKG  Scheduled Meds: . aspirin  81 mg Per Tube Daily  . Chlorhexidine Gluconate Cloth  6 each Topical Daily  . folic acid  1 mg Per Tube Daily  . heparin injection (subcutaneous)  5,000 Units Subcutaneous Q8H  . mouth rinse  15 mL Mouth Rinse BID  . multivitamin with minerals  1 tablet Per Tube Daily  . nutrition supplement (JUVEN)  1 packet Per Tube BID BM  . pantoprazole sodium  40 mg Per Tube QHS  . sodium chloride flush  10-40 mL Intracatheter Q12H  . sodium chloride flush  5 mL Intracatheter Q8H  . sodium hypochlorite   Irrigation Daily  . thiamine  100 mg Per Tube Daily   Continuous Infusions: .  ceFAZolin (ANCEF) IV 2 g (01/19/2021 1416)  . dextrose 5 % and 0.9% NaCl 50 mL/hr at 2021-01-19 0450  . feeding supplement (VITAL AF 1.2 CAL) 35 mL/hr at 12/27/20 1952    Principal Problem:   C. difficile diarrhea Active Problems:   Generalized weakness   Hypothermia   Failure to thrive in adult   Hypotension   Symptomatic anemia   Hypoalbuminemia   Prolonged QT interval   Gastric AVM   SIRS (systemic inflammatory response syndrome) (HCC)   Hypokalemia   Essential hypertension   Pressure injury of skin   Protein-calorie malnutrition, severe   Cervical spinal stenosis    Functional quadriplegia (HCC)   Hyperglycemia   Sinus bradycardia   H/O: CVA (cerebrovascular accident)   Underweight   LOS: 23 days   A & P  Septic shock secondary to acute cholecystitis  -improving leukocytosis and pressor requirement -s/p perc cholecystostomy tube in IR 1/15 -cortisol level 20.8 E. Coli bacteremia   -Pansensitive  Elevated LFTs/ T. Bili  -improving P: Continue Cefazolin  Pressor support stopped 1/17 Closely monitor volume status  Strict I&O Consider removal of PICC line soon  Trend LFTs Flush biliary drain per IR recommendations    Acute Encephalopathy  -  secondary to sepsis and progressing failure to thrive  P: Delirium precautions  Frequent neuro checks  Minimize sedation   LLL opacity -concern for developing aspiration vs atelectasis  P: Remains on RA Aspiration precautions  SLP eval for safe diet initiation when able  Encourage pulmonary hygiene   Unstageable Sacral decub -Last measurement per WOC 6 x 7cm  P: WOC following  Frequent turns  Pressure alleviating measures  Nutrition support as able   C. difficile colitis - no further diarrhea; completed oral vanc 1/7 P: Supportive care   Cervical spine stenosis - not a surgical candidate per NSGY  P: Follow neuro exams  Supprotive care   Hypoglycemia - secondary to sepsis/ elevated LFTs P: Trend CBGs  Severe malnutrition  -As evidence by sever muscle and fat depletion P: Need to establish entral access Attempts made for NGT 1/17 but unsuccessful  Attempt Cortrack when able  Need to speak with family desire for PEG placement  GOC  Given patient progressive decline with evidence of failure to thrive. Further discussion on GOC need to be established. Re-consult to palliative care today.  Stanley Grimley, DO [facility] Direct contact: see [email or web address]  7PM-7AM contact night coverage as above Dec 29, 2020, 6:15 PM  LOS: 23 days   ADDENDUM: Received call from  nursing a few minutes ago that the patient had vomited tube feed. He then has a couple of agonal breaths followed by cessation of respirations and spontaneous cardiac activity. Nurse, Stanley Scott was to pronounce the patient. I called and spoke to the patient's son Stanley Scott to explain that his father had expired. He voiced understanding.

## 2021-01-10 NOTE — Progress Notes (Signed)
Patient vomited large amount of yellow emesis, TF stopped and Zofran 4 mg IV given. Will continue to monitor.

## 2021-01-10 NOTE — Progress Notes (Signed)
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 Pt found to have severe cervical spine stenosis on 12/30 MRI, transferred to [facility].   OT comments  Pt progressing slowly towards acute OT goals. Up to total A with bed mobility. Sat EOB about 3 minutes with up to max A and BUE support. Fatigued quickly, initiating return to supine. Posterior, and at times right lateral, lean noted. D/c plan remains appropriate.    Follow Up Recommendations  SNF    Equipment Recommendations  None recommended by OT    Recommendations for Other Services      Precautions / Restrictions Precautions Precautions: Fall Precaution Comments: cortrak, biliary tube, PICC Restrictions Weight Bearing Restrictions: No       Mobility Bed Mobility Overal bed mobility: Needs Assistance Bed Mobility: Rolling;Sidelying to Sit;Sit to Supine Rolling: Max assist Sidelying to sit: Total assist   Sit to supine: Total assist;+2 for physical assistance      Transfers                      Balance Overall balance assessment: Needs assistance Sitting-balance support: Feet supported;Bilateral upper extremity supported;Single extremity supported Sitting balance-Leahy Scale: Poor Sitting balance - Comments: reliant on BUE assist and/or external support, up to max assist Postural control: Posterior lean;Right lateral lean                                 ADL  either performed or assessed with clinical judgement   ADL Overall ADL's : Needs assistance/impaired                                       General ADL Comments: Pt completed bed mobility and sat EOB a few minutes (< 5 min) before fatiguing back into supine     Vision       Perception     Praxis     Cognition Arousal/Alertness: Awake/alert;Lethargic Behavior During Therapy: Flat affect Overall Cognitive Status: Impaired/Different from baseline Area of Impairment: Attention;Memory;Following commands;Safety/judgement;Awareness;Problem solving                   Current Attention Level: Focused Memory: Decreased recall of precautions;Decreased short-term memory Following Commands: Follows one step commands inconsistently Safety/Judgement: Decreased awareness of safety;Decreased awareness of deficits Awareness: Intellectual Problem Solving: Slow processing;Requires verbal cues          Exercises     Shoulder Instructions       General Comments      Pertinent Vitals/ Pain       Pain Assessment: Faces Faces Pain Scale: Hurts little more Pain Location: generalized with movement Pain Descriptors / Indicators: Grimacing Pain Intervention(s): Monitored during session;Limited activity within patient's tolerance;Repositioned  Home Living  Prior Functioning/Environment              Frequency  Min 2X/week        Progress Toward Goals  OT Goals(current goals can now be found in the care plan section)  Progress towards OT goals: Progressing toward goals (slowly)  Acute Rehab OT Goals Patient Stated Goal: to get stronger OT Goal Formulation: With patient Time For Goal Achievement: 01/11/21 Potential to Achieve Goals: Fair ADL Goals Pt Will Perform Eating: with min assist;sitting Pt Will Perform Grooming: with min assist;sitting Pt Will Transfer to Toilet: with mod assist;stand  pivot transfer;bedside commode Pt Will Perform Toileting - Clothing Manipulation and hygiene: with mod assist;sitting/lateral leans;sit to/from stand Pt/caregiver will Perform Home Exercise Program: Increased strength;Both right and left upper extremity;With minimal assist;With written HEP provided  Plan Discharge plan remains appropriate;Frequency remains appropriate    Co-evaluation                 AM-PAC OT "6 Clicks" Daily Activity     Outcome Measure   Help from another person eating meals?: A Lot Help from another person taking care of personal grooming?: A Lot Help from another person toileting, which includes using toliet, bedpan, or urinal?: Total Help from another person bathing (including washing, rinsing, drying)?: A Lot Help from another person to put on and taking off regular upper body clothing?: A Lot Help from another person to put on and taking off regular lower body clothing?: A Lot 6 Click Score: 11    End of Session    OT Visit Diagnosis: Muscle weakness (generalized) (M62.81);Repeated falls (R29.6);Other symptoms and signs involving cognitive function   Activity Tolerance Patient tolerated treatment well;Patient limited by fatigue   Patient Left in bed;with call bell/phone within reach;with bed alarm set;with SCD's reapplied   Nurse Communication          Time: 1275-1700 OT Time Calculation (min): 27 min  Charges: OT General Charges $OT Visit: 1 Visit OT Treatments $Self Care/Home Management : 23-37 mins  Raynald Kemp, OT Acute Rehabilitation Services Pager: [phone] Office: [phone]    Pilar Grammes 01/08/2021, 3:22 PM

## 2021-01-10 NOTE — Progress Notes (Signed)
Palliative:  Mr. Gribble is lying quietly in bed.  He greets me, making and mostly keeping eye contact.  He appears acutely/chronically ill and somewhat frail.  I believe that he is able to make his basic needs known if asked.  There is no family at bedside at this time.  Call to son, Regan Llorente. No answer, unable to leave VM message.   Conference with attending, bedside nursing staff, transition of care team related to patient condition, needs, goals of care. PMT to continue to follow  Plan:   At this point continue to treat the treatable but no CPR or intubation.  Working toward [employer] at [employer]  25 minutes Lillia Carmel, NP Palliative Medicine Team  Team Phone [phone] Greater than 50% of this time was spent counseling and coordinating care related to the above assessment and plan.

## 2021-01-10 DEATH — deceased
